# Patient Record
Sex: Female | Born: 1960
Health system: Southern US, Community
[De-identification: ages and names within clinical notes are randomized; demographics above are authoritative.]

## PROBLEM LIST (undated history)

## (undated) DIAGNOSIS — R600 Localized edema: Secondary | ICD-10-CM

## (undated) DIAGNOSIS — R000 Tachycardia, unspecified: Secondary | ICD-10-CM

## (undated) DIAGNOSIS — M1990 Unspecified osteoarthritis, unspecified site: Secondary | ICD-10-CM

## (undated) DIAGNOSIS — K219 Gastro-esophageal reflux disease without esophagitis: Secondary | ICD-10-CM

## (undated) DIAGNOSIS — F329 Major depressive disorder, single episode, unspecified: Secondary | ICD-10-CM

## (undated) DIAGNOSIS — F419 Anxiety disorder, unspecified: Secondary | ICD-10-CM

## (undated) DIAGNOSIS — F32A Depression, unspecified: Secondary | ICD-10-CM

## (undated) DIAGNOSIS — K5780 Diverticulitis of intestine, part unspecified, with perforation and abscess without bleeding: Secondary | ICD-10-CM

## (undated) DIAGNOSIS — J90 Pleural effusion, not elsewhere classified: Secondary | ICD-10-CM

## (undated) DIAGNOSIS — K5792 Diverticulitis of intestine, part unspecified, without perforation or abscess without bleeding: Secondary | ICD-10-CM

## (undated) DIAGNOSIS — R609 Edema, unspecified: Secondary | ICD-10-CM

## (undated) DIAGNOSIS — Q682 Congenital deformity of knee: Secondary | ICD-10-CM

## (undated) HISTORY — PX: RHINOPLASTY: SUR1284

## (undated) HISTORY — PX: SHOULDER ARTHROSCOPY W/ ROTATOR CUFF REPAIR: SHX2400

## (undated) HISTORY — DX: Diverticulitis of intestine, part unspecified, without perforation or abscess without bleeding: K57.92

## (undated) HISTORY — PX: EYE SURGERY: SHX253

## (undated) HISTORY — DX: Depression, unspecified: F32.A

## (undated) HISTORY — PX: LASIK: SHX215

## (undated) HISTORY — DX: Diverticulitis of intestine, part unspecified, with perforation and abscess without bleeding: K57.80

## (undated) HISTORY — PX: EXPLORATORY LAPAROTOMY: SUR591

## (undated) HISTORY — DX: Pleural effusion, not elsewhere classified: J90

## (undated) HISTORY — DX: Major depressive disorder, single episode, unspecified: F32.9

## (undated) HISTORY — PX: COLON SURGERY: SHX602

---

## 1981-09-13 HISTORY — PX: PILONIDAL CYST EXCISION: SHX744

## 1986-09-13 HISTORY — PX: HERNIA REPAIR: SHX51

## 1991-09-14 HISTORY — PX: ECTOPIC PREGNANCY SURGERY: SHX613

## 1995-09-14 HISTORY — PX: GANGLION CYST EXCISION: SHX1691

## 1998-12-04 ENCOUNTER — Other Ambulatory Visit: Admission: RE | Admit: 1998-12-04 | Discharge: 1998-12-04 | Payer: Self-pay | Admitting: Gynecology

## 2000-08-16 ENCOUNTER — Encounter: Payer: Self-pay | Admitting: Internal Medicine

## 2000-08-16 ENCOUNTER — Emergency Department (HOSPITAL_COMMUNITY): Admission: EM | Admit: 2000-08-16 | Discharge: 2000-08-16 | Payer: Self-pay | Admitting: Internal Medicine

## 2000-12-02 ENCOUNTER — Encounter: Payer: Self-pay | Admitting: Gynecology

## 2000-12-02 ENCOUNTER — Ambulatory Visit (HOSPITAL_COMMUNITY): Admission: RE | Admit: 2000-12-02 | Discharge: 2000-12-02 | Payer: Self-pay | Admitting: Gynecology

## 2001-10-09 ENCOUNTER — Encounter: Admission: RE | Admit: 2001-10-09 | Discharge: 2001-10-09 | Payer: Self-pay | Admitting: Emergency Medicine

## 2001-10-09 ENCOUNTER — Encounter: Payer: Self-pay | Admitting: Emergency Medicine

## 2002-01-10 ENCOUNTER — Encounter: Payer: Self-pay | Admitting: Gynecology

## 2002-01-10 ENCOUNTER — Ambulatory Visit (HOSPITAL_COMMUNITY): Admission: RE | Admit: 2002-01-10 | Discharge: 2002-01-10 | Payer: Self-pay | Admitting: Gynecology

## 2002-02-19 ENCOUNTER — Other Ambulatory Visit: Admission: RE | Admit: 2002-02-19 | Discharge: 2002-02-19 | Payer: Self-pay | Admitting: Gynecology

## 2002-12-06 ENCOUNTER — Ambulatory Visit (HOSPITAL_COMMUNITY): Admission: RE | Admit: 2002-12-06 | Discharge: 2002-12-06 | Payer: Self-pay | Admitting: Internal Medicine

## 2002-12-06 ENCOUNTER — Encounter: Payer: Self-pay | Admitting: Internal Medicine

## 2003-01-11 ENCOUNTER — Ambulatory Visit (HOSPITAL_COMMUNITY): Admission: RE | Admit: 2003-01-11 | Discharge: 2003-01-11 | Payer: Self-pay | Admitting: Internal Medicine

## 2003-01-11 ENCOUNTER — Encounter: Payer: Self-pay | Admitting: Internal Medicine

## 2003-04-12 ENCOUNTER — Encounter: Payer: Self-pay | Admitting: Emergency Medicine

## 2003-04-12 ENCOUNTER — Encounter: Admission: RE | Admit: 2003-04-12 | Discharge: 2003-04-12 | Payer: Self-pay | Admitting: Emergency Medicine

## 2003-06-20 ENCOUNTER — Encounter: Admission: RE | Admit: 2003-06-20 | Discharge: 2003-09-18 | Payer: Self-pay | Admitting: Emergency Medicine

## 2003-07-23 ENCOUNTER — Other Ambulatory Visit: Admission: RE | Admit: 2003-07-23 | Discharge: 2003-07-23 | Payer: Self-pay | Admitting: Obstetrics and Gynecology

## 2003-09-26 ENCOUNTER — Encounter: Admission: RE | Admit: 2003-09-26 | Discharge: 2003-12-25 | Payer: Self-pay | Admitting: Emergency Medicine

## 2004-01-14 ENCOUNTER — Ambulatory Visit (HOSPITAL_COMMUNITY): Admission: RE | Admit: 2004-01-14 | Discharge: 2004-01-14 | Payer: Self-pay | Admitting: Neurosurgery

## 2004-06-23 ENCOUNTER — Encounter: Admission: RE | Admit: 2004-06-23 | Discharge: 2004-08-19 | Payer: Self-pay | Admitting: Emergency Medicine

## 2004-08-31 ENCOUNTER — Ambulatory Visit: Payer: Self-pay | Admitting: Internal Medicine

## 2004-09-13 DIAGNOSIS — R000 Tachycardia, unspecified: Secondary | ICD-10-CM

## 2004-09-13 HISTORY — PX: OTHER SURGICAL HISTORY: SHX169

## 2004-09-13 HISTORY — PX: CARDIAC ELECTROPHYSIOLOGY STUDY AND ABLATION: SHX1294

## 2004-09-13 HISTORY — DX: Tachycardia, unspecified: R00.0

## 2004-09-21 ENCOUNTER — Ambulatory Visit: Payer: Self-pay | Admitting: Internal Medicine

## 2004-09-25 ENCOUNTER — Ambulatory Visit (HOSPITAL_COMMUNITY): Admission: RE | Admit: 2004-09-25 | Discharge: 2004-09-26 | Payer: Self-pay | Admitting: Internal Medicine

## 2004-09-25 ENCOUNTER — Ambulatory Visit: Payer: Self-pay | Admitting: Internal Medicine

## 2004-11-05 ENCOUNTER — Ambulatory Visit: Payer: Self-pay | Admitting: Internal Medicine

## 2005-01-28 ENCOUNTER — Ambulatory Visit (HOSPITAL_COMMUNITY): Admission: RE | Admit: 2005-01-28 | Discharge: 2005-01-28 | Payer: Self-pay | Admitting: Obstetrics and Gynecology

## 2005-03-24 ENCOUNTER — Encounter (INDEPENDENT_AMBULATORY_CARE_PROVIDER_SITE_OTHER): Payer: Self-pay | Admitting: *Deleted

## 2005-03-24 ENCOUNTER — Ambulatory Visit (HOSPITAL_COMMUNITY): Admission: RE | Admit: 2005-03-24 | Discharge: 2005-03-24 | Payer: Self-pay | Admitting: *Deleted

## 2005-03-24 ENCOUNTER — Ambulatory Visit (HOSPITAL_BASED_OUTPATIENT_CLINIC_OR_DEPARTMENT_OTHER): Admission: RE | Admit: 2005-03-24 | Discharge: 2005-03-24 | Payer: Self-pay | Admitting: *Deleted

## 2006-02-11 ENCOUNTER — Ambulatory Visit (HOSPITAL_COMMUNITY): Admission: RE | Admit: 2006-02-11 | Discharge: 2006-02-11 | Payer: Self-pay | Admitting: Obstetrics and Gynecology

## 2007-03-06 ENCOUNTER — Ambulatory Visit (HOSPITAL_COMMUNITY): Admission: RE | Admit: 2007-03-06 | Discharge: 2007-03-06 | Payer: Self-pay | Admitting: Obstetrics and Gynecology

## 2007-07-24 ENCOUNTER — Encounter: Admission: RE | Admit: 2007-07-24 | Discharge: 2007-10-22 | Payer: Self-pay | Admitting: Sports Medicine

## 2007-09-01 ENCOUNTER — Ambulatory Visit (HOSPITAL_COMMUNITY): Admission: RE | Admit: 2007-09-01 | Discharge: 2007-09-01 | Payer: Self-pay | Admitting: Sports Medicine

## 2007-12-08 ENCOUNTER — Ambulatory Visit: Payer: Self-pay | Admitting: Internal Medicine

## 2008-03-07 ENCOUNTER — Ambulatory Visit (HOSPITAL_COMMUNITY): Admission: RE | Admit: 2008-03-07 | Discharge: 2008-03-07 | Payer: Self-pay | Admitting: Obstetrics and Gynecology

## 2008-03-25 ENCOUNTER — Ambulatory Visit (HOSPITAL_COMMUNITY): Admission: RE | Admit: 2008-03-25 | Discharge: 2008-03-25 | Payer: Self-pay | Admitting: Orthopedic Surgery

## 2008-03-28 ENCOUNTER — Encounter: Admission: RE | Admit: 2008-03-28 | Discharge: 2008-06-26 | Payer: Self-pay | Admitting: Orthopedic Surgery

## 2008-07-03 ENCOUNTER — Encounter: Admission: RE | Admit: 2008-07-03 | Discharge: 2008-07-09 | Payer: Self-pay | Admitting: Orthopedic Surgery

## 2009-03-10 ENCOUNTER — Ambulatory Visit (HOSPITAL_COMMUNITY): Admission: RE | Admit: 2009-03-10 | Discharge: 2009-03-10 | Payer: Self-pay | Admitting: Obstetrics and Gynecology

## 2010-05-07 ENCOUNTER — Ambulatory Visit (HOSPITAL_COMMUNITY): Admission: RE | Admit: 2010-05-07 | Discharge: 2010-05-07 | Payer: Self-pay | Admitting: Obstetrics and Gynecology

## 2010-09-13 HISTORY — PX: KNEE SURGERY: SHX244

## 2010-10-04 ENCOUNTER — Encounter: Payer: Self-pay | Admitting: Sports Medicine

## 2010-12-17 ENCOUNTER — Other Ambulatory Visit: Payer: Self-pay | Admitting: Obstetrics & Gynecology

## 2011-01-26 NOTE — Op Note (Signed)
NAME:  Amanda [name], POLHEMUS              ACCOUNT NO.:  [id number]   MEDICAL RECORD NO.:  [id number]          PATIENT TYPE:  AMB   LOCATION:  SDS                          FACILITY:  [facility]   PHYSICIAN:  Almedia Balls. Ranell Patrick, M.D. DATE OF BIRTH:  [birth year]   DATE OF PROCEDURE:  DATE OF DISCHARGE:  03/25/2008                               OPERATIVE REPORT   PREOPERATIVE DIAGNOSIS:  Right shoulder superior labral tear, anterior  to posterior and rotator cuff tear.   POSTOPERATIVE DIAGNOSIS:  Right shoulder superior labral tear, anterior  to posterior and rotator cuff tear.   PROCEDURE PERFORMED:  1. Right shoulder arthroscopy with extensive intra-articular      debridement of torn superior labrum, anterior to posterior.  2. Arthroscopic biceps tenotomy followed by arthroscopic subacromial      decompression mini-open rotator cuff repair and open biceps      tenodesis in the groove.   SURGEON:  Almedia Balls. Ranell Patrick, M.D.   ASSISTANT:  Wess Botts, PA-C.   ANESTHESIA:  General anesthesia plus interscalene block anesthesia was  used.   ESTIMATED BLOOD LOSS:  Minimal.   FLUIDS REPLACEMENT:  1500 mL crystalloid.   INSTRUMENT COUNT:  Correct.   COMPLICATIONS:  None.   Perioperative antibiotics were given.   INDICATIONS:  The patient is a 50 year old right-hand dominant female  who presents with history of worsening right shoulder pain and loss of  function.  The patient has preoperative workup consisting of clinical  evaluation, plain radiographs, and MRI scans indicating a torn superior  labrum anterior to posterior as well as an injured rotator cuff, so  feeling of this is a near full thickness versus full thickness rotator  cuff tear likely requiring repair due to the patient's progressive and  persistent pain and loss of function, she is electing to proceed with  surgical management to relieve pain and restore function.  Informed  consent was obtained.   DESCRIPTION OF  PROCEDURE:  After an adequate level of anesthesia was  achieved, the patient was positioned in the modified beach chair  position.  Right shoulder was sterilely prepped and draped in usual  manner.  We examined the shoulder under anesthesia prior to the prep and  drape, noting full passive range of motion in the shoulder with no  instability.  We then entered the shoulder using standard arthroscopic  portals after sterile prep and drape using 11 blade scalpel and blunt  operators, we utilized the anteroposterior lateral portals.  We  identified a torn superior labrum anterior to posterior.  This was very  degenerative appearing tear, not amenable to repair.  We performed a  through labral debridement as well as biceps tenotomy.  The  anteroinferior labrum and posterior labrum were intact.  The articular  cartilage in the glenohumeral joint was normal.  Subscapula was normal.  Rotator cuff appeared normal both from the anterior and posterior  perspectives.  The scope was placed __________ space through a  bursectomy, acromioplasty was performed creating a type 1 acromial  shape, removed the anterior overhang and released the CA ligament.  We  did note  a what appeared to be near full thickness rotator cuff tear  located in the supraspinatus insertion area.  At this point, we  concluded the arthroscopy, made a small Saber incision in line with the  skin lines and this was a mini-open incision from the anterolateral  border of the acromion extending down about an inch and half.  Dissection carried sharply down through subcutaneous tissue, we split  the deltoid __________ fibers, identified the bicipital groove, incised  the soft tissue overlying the groove, delivered a very, very small  biceps tendon into the wound __________#2 FiberWire suture.  We then  tenodesed the biceps mid tension with the elbow at 90 degrees using a 7  x 22 mm Arthrex Bio-tenodesis screw.  The patient's femur was  extremely  small at that level.  The drill was able to get on to depth of about 23  mm.  Thus, when the tendon and screw were placed in the hole, there was  about 2-3 mm of screw up above the humerus.  This was a __________but  felt to be acceptable and the screw simply would not go any further.  At  this point, we concluded the biceps tenodesis and went ahead and  addressed the rotator cuff.  This is an  __________type bursal surface  tear, measuring about 1.5 cm in its anterior-to-posterior direction.  At  this point, we freshened up the rotator cuff footprint removing all soft  tissue and rongeuring down the greater tuberosity a little bit.  We then  placed a single 5.5 Bio-Corkscrew anchor adjacent to the intact  articular margins of the rotator cuff.  We then brought those sutures,  double loaded #2 FiberWire sutures up in a mattress fashion through the  rotator cuff, which was freed up using a Cobb elevator.  This reduced  very nicely and then we also used a mattress suture #2 FiberWire medial  to that and brought that down once we had tied her 5.5 Bio-Corkscrew  anchor and sutures and recreated the medial portion of footprint.  We  brought the mattress suture over the top and used a 4.5 PushLock anchor  out on the lateral side of the humerus to secure the lateral portion of  the footprint.  We had a nice __________repair.  We reapproximated  tendon to bone.  At this point, we took the shoulder through full range  of motion.  No impingement was noted.  We thoroughly irrigated the wound  and closed the deltoid with 0 Vicryl suture followed by 2-0 Vicryl  subcutaneous closure and 4-0 Monocryl for skin.  Sterile compressive  bandage was applied followed by a sling immobilizer.  The patient  tolerated the surgery well.      Almedia Balls. Ranell Patrick, M.D.  Electronically Signed     SRN/MEDQ  D:  03/25/2008  T:  03/26/2008  Job:  [id number]

## 2011-01-26 NOTE — Letter (Signed)
December 08, 2007    Jonita Albee, M.D.  [facility]  [address]  [place], [place] [address]   RE:  Amanda [name], Amanda [name]  MRN:  [id number]  /  DOB:  September 06, 1961   Dear Thayer Ohm;   Thank you for referring back Ms. Lupita Dawn.  As you know, she is a  very pleasant 50 year old woman, with a history of SVT; who underwent  electrophysiologic study and catheter ablation of AV node reentrant  tachycardia several years ago.  Currently, she has developed worsening  blood pressure problems and also has occasional palpitations, though no  documented arrhythmias.  She states that she has gained a little bit of  weight over the last several years, and has not been exercising.  Blood  pressures, according to the patient, run between 130 and 150 systolic.  She notes that she these have improved some since the initiation of 25  mg Toprol XL daily several weeks ago.  Her exam has been well  characterized.  Her EKG demonstrates sinus rhythm with a normal axis and  intervals.  Today her blood pressure was in the 140 range systolic.  Her  weight was 185 pounds.   With all the above, I have recommended the patient at this point to  increase her Toprol from 25 mg a day to 50 mg a day.  She had tolerated  this dose in the past.  Hopefully, this will control blood pressure.  I  have also recommended that she start back exercising; and this may also,  with some weight loss, help in improvement of blood pressure.  If the  blood pressure is not well-controlled with these medication changes, or  if she develops fatigue and weakness on 50 mg of Toprol, then obviously  other blood pressure medications could be tried.  I do not think her  palpitations are worth investigating further, at least not at this  point; and they are likely secondary to PACs and PVCs.   Thanks again for referring [name] back for evaluation.    Sincerely,      Doylene Canning. Ladona Ridgel, MD  Electronically Signed    GWT/MedQ  DD: 12/08/2007  DT: 12/09/2007  Job #: [id number]

## 2011-01-26 NOTE — Assessment & Plan Note (Signed)
[facility]                         ELECTROPHYSIOLOGY OFFICE NOTE   Amanda, [name]                     MRN:          [id number]  DATE:12/08/2007                            DOB:          1961/01/11    REFERRING PHYSICIAN:  Jonita Albee, M.D.   Amanda [name] returns today for follow-up and is referred back today for  evaluation by Dr. Robert Bellow.  She is a very pleasant 50 year old  woman, who has a history of SVT and is status post catheter ablation  back in 2006.  She also has a history of hypertension and a remote  history of ectopic pregnancies in the past.  The patient was noted to  have some increasing palpitations and elevation of her blood pressure,  and she was seen by Dr. Perrin Maltese, who had initially started her back on  Toprol 25 mg a day.  She returns today for follow-up.   She has had no syncope.  She has had no sustained heart racing.  She  denies chest pain or shortness of breath.  Additional past medical  history is notable for hernia repair in the past.  She had 2 ectopic  pregnancies.  She has a history of diverticulitis.   FAMILY HISTORY:  Notable for mother having heart disease.   SOCIAL HISTORY:  The patient has a remote history of tobacco use.  She  drinks alcohol socially, but not in excess.   REVIEW OF SYSTEMS:  This is really unremarkable, except for very mild  palpitations.  Otherwise, it is as noted in the HPI.   PHYSICAL EXAMINATION:  She is a pleasant, well-appearing woman in no  acute distress.  Blood pressure was 142/90, the pulse 70 and regular,  the respirations were 18.  The weight was 185 pounds.  HEENT:  Normocephalic and atraumatic.  Pupils were equal and round.  The  oropharynx was moist.  The sclerae are anicteric.  NECK:  Revealed no jugular distention.  There was no thyromegaly.  Trachea was midline.  The carotids are  2+ and symmetric.  LUNGS:  Clear bilaterally to auscultation.  No wheezes, rales or  rhonchi  are present.  There was no increased work of breathing.  CARDIOVASCULAR:  Revealed a regular rate and rhythm, with normal S1 and  S2.  There were no murmurs, rubs or gallops.  The PMI was not enlarged  nor was it laterally displaced.  ABDOMEN:  Soft, nontender, nondistended.  There was no organomegaly.  The bowel sounds were present. There was no rebound or guarding.  EXTREMITIES:  Demonstrate no sinus, clubbing or edema.  Pulses were  2+  and symmetric.  NEUROLOGIC:  Alert and oriented x3 with cranial nerves  intact.  Strength was 5/5 and symmetric.   EKG:  Demonstrates sinus rhythm with normal axis intervals.   IMPRESSION:  1. Worsening hypertension, although still fairly mild.  2. Rare palpitations.  3. History of sustained ventricular tachycardia, status post ablation.   DISCUSSION:  With regard to Amanda [name]'s elevated blood pressure, it is  not horribly uncontrolled.  I have asked today that we increase  her beta  blocker from 25 mg a day to 50 mg daily.  This may also help her  palpitations.  If her blood pressure is not improved, then we will plan  to see her back in the office sooner rather than later.     Amanda [name]. Amanda Ridgel, MD  Electronically Signed    GWT/MedQ  DD: 12/08/2007  DT: 12/09/2007  Job #: [id number]   cc:   Jonita Albee, M.D.

## 2011-01-29 NOTE — Discharge Summary (Signed)
NAME:  Amanda [name], Amanda [name]              ACCOUNT NO.:  [id number]   MEDICAL RECORD NO.:  [id number]          PATIENT TYPE:  OIB   LOCATION:  3742                         FACILITY:  [facility]   PHYSICIAN:  Doylene Canning. Ladona Ridgel, M.D.  DATE OF BIRTH:  03-Dec-1960   DATE OF ADMISSION:  09/25/2004  DATE OF DISCHARGE:  09/26/2004                                 DISCHARGE SUMMARY   DISCHARGE DIAGNOSES:  1.  Presyncope, progressively symptomatic, associated with supraventricular      tachycardia.  2.  Discharging day #1, status post electrophysiology studies and      radiofrequency catheter ablation of atrioventricular node, reentry      tachycardia, by way of slow P wave modification, Dr. Lewayne Bunting,      practitioner.   SECONDARY DIAGNOSES:  1.  History of migraines.  2.  History of diverticulitis in 2004.  3.  History of ectopic pregnancies.  4.  Status post abdominal herniorrhaphy.  5.  Status post rhinoplasty.   PROCEDURE:  On September 25, 2004, electrophysiology study with successful  slow P wave modification, rendering supraventricular tachycardia  noninducible.  This was an atrioventricular nodal reentry tachycardia.   DISCHARGE DISPOSITION:  Ready for discharge.   On post procedure day #1, the catheter site in the groin shows no evidence  of swelling, erythema, or bruit.  The patient has had sinus rhythm, status  post radiofrequency catheter ablation without dysrhythmias.  She has had no  respiratory compromise.  She has been afebrile.  She will go home without  Toprol XL 50 mg, her dose prior to admission.  She will go home with enteric-  coated aspirin 25 mg daily for the next six weeks.  She has been counseled  that if she plans dental work before July, 2006, even teeth-cleaning, she is  to call the office for antibiotic coverage at [phone].  As far as pain  management is concerned, Tylenol 325 mg 1-2 tabs every 4-6 hours as needed.  She is asked not to drive for the next two days, not  to engage in any  straining or heavy lifting for the next two weeks.   DISCHARGE DIET:  Low sodium, low cholesterol.   Ms. Ladona Ridgel may shower.  She is to call [phone] if she experiences pain or  swelling at the catheterization site.  She is to follow up with Dr. Ladona Ridgel  at [facility] on [address] on Thursday, February 23  at 10:15 in the morning.   BRIEF HISTORY:  Ms. Ladona Ridgel is a 50 year old female.  She has a history of  palpitations, which have been present for several years.  Over the last six  months, her symptoms have progressed in frequency and severity.  Sometimes  she has no symptoms but on some days, she has 3-4 episodes.  They typically  last several minutes and are associated with presyncope and dyspnea.  She  denies associated chest pain.  The episodes start suddenly and stop  suddenly.  There is no warning, and they are not related to exertion.  Typically after the episodes, the patient  has a severe headache.  She has  consulted with her cardiologist, Dr. Candace Cruise, and with monitoring, was  found to have a supraventricular tachycardia with heart rates exceeding 220  beats per minute.  She was placed on Toprol XL.  The patient notes that she  has continued to have symptoms, although they are not as severe.  The  Toprol, however, makes her feel somewhat fatigued.  She is referred to Dr.  Lewayne Bunting for additional evaluation.  The plan will be for treatment of  this supraventricular tachycardia by means of an electrophysiology study and  catheter ablation.  This plan has been discussed with the patient, and she  wishes to proceed.   HOSPITAL COURSE:  The patient presented to [facility] electively on  January 13.  She underwent successful  radiofrequency catheter ablation of  an inducible AV nodal reentry tachycardia.  She has had no complications in  the post procedure hospitalization.  She has been in a sinus rhythm  throughout her  hospitalization.  She goes home with the medication as  dictated and to follow up as dictated.       GM/MEDQ  D:  09/25/2004  T:  09/25/2004  Job:  [id number]   cc:   Meade Maw, M.D.  [address]  [place]  [place] [address]  Fax: [phone]   Reuben Likes, M.D.  [address]  [place]  [place] [address]  Fax: [phone]

## 2011-01-29 NOTE — Op Note (Signed)
NAME:  Amanda [name], Amanda [name]              ACCOUNT NO.:  [id number]   MEDICAL RECORD NO.:  [id number]          PATIENT TYPE:  OIB   LOCATION:  2899                         FACILITY:  [facility]   PHYSICIAN:  Doylene Canning. Ladona Ridgel, M.D.  DATE OF BIRTH:  04-05-1961   DATE OF PROCEDURE:  09/25/2004  DATE OF DISCHARGE:                                 OPERATIVE REPORT   PROCEDURES PERFORMED:  1.  Electrophysiology study.  2.  Radiofrequency catheter ablation of the AV reentrant tachycardia.   INTRODUCTION:  The patient is a very pleasant 50 year old woman with a long  history of tachypalpitations which have increased in frequency and severity  over the last several months.  She has been on multiple medications, most  recently high-dose Toprol, but continued to have symptoms.  For this reason,  she is referred for catheter ablation.  l   DESCRIPTION OF PROCEDURE:  After informed consent was obtained, the patient  was taken to the diagnostic EP laboratory in the fasting state.  After usual  preparation and draping, intravenous fentanyl and midazolam were given for  sedation.  A 6 French hexapolar catheter was inserted percutaneously into  the right jugular vein and advanced to the coronary sinus.  A 5 French  quadripolar catheter was inserted percutaneously in the right femoral vein  and advanced to the right ventricle.  A 57 French quadripolar catheter was  inserted percutaneously into the right femoral vein and advanced to the His'  bundle region.  After measurement of the basic intervals, rapid ventricular  pacing was carried out from the RV apex at a pacing cycle length of 600 msec  and stepwise decreased down to 290 msec where VA Wenckebach was preserved.  During rapid ventricular pacing, the atrial activation sequence was midline  and decremental.  Next programmed ventricular stimulation was carried out  from the RV apex at a base adjusted cycle length of 600 msec.  The S1-2  interval was stepwise  decreased down to 300 msec where a retrograde AV node  ARP was observed.  During programmed ventricular stimulation, the atrial  activation sequence was midline and decremental.  Next programmed atrial  stimulation was carried out from the coronary sinus, as well as from the  __________ atrium with a base adjusted cycle length of 500 msec.  The S1-2  interval was stepwise decreased down to 320 msec resulting in the initiation  of SVT.  The tachycardia was a narrow QRS tachycardia at a rate of 200 beats  per minute.  PVCs were then placed at the time of His' bundle refractoriness  and these did not preexcite the atria.  During tachycardia, RV pacing was  carried out demonstrating a VAV conduction sequence.  Following this the  tachycardia was terminated with coronary sinus pacing.  Next rapid atrial  pacing was carried out from the coronary sinus, as well as the _________  atrium with pacing cycle lengths of 500 msec and stepwise decreased down to  320 msec, resulting in again the initiation of SVT.  During rapid atrial  pacing with the induction of tachycardia, mapping was carried  out  demonstrating midline atrial activation and a short VA interval.  At this  point, the 7 French quadripolar catheter was inserted into the right atrium  and mapping was carried out.  Koch's triangle was somewhat smaller than  usual.  Five RF energy applications were delivered to sites 7 and 8 in  Koch's triangle.  This resulted in accelerated junctional rhythm.  Following  this, the patient was observed for approximately 40 minutes.  During this  time, rapid atrial pacing was carried out from the right atrium, the  coronary sinus and the right ventricle and there was no inducible SVT.  Slow  pathway conduction was no longer present.  The catheters were then removed,  hemostasis was assured and the patient was returned to her room in  satisfactory condition.   COMPLICATIONS:  There were no immediate procedure  complications.   RESULTS:  1.  Baseline electrocardiogram:  The baseline ECG demonstrates normal sinus      rhythm with normal axis and intervals.  2.  Baseline intervals:  The sinus node cycle length was 768 msec, the HV      interval 34 msec and the QRS duration was 83 msec.  3.  Rapid ventricular pacing:  Rapid ventricular pacing was carried out from      the RV apex at a pacing cycle length of 600 msec and stepwise decreased      down to 290 msec where AV Wenckebach was observed.  During rapid      ventricular pacing, the atrial activation was midline and decremental.  4.  Programmed ventricular stimulation:  Programmed ventricular stimulation      was carried out from the RV apex at a base adjusted cycle length of 500      msec.  The S1-S2 interval was stepwise decreased from 440 msec down to      300 msec where the retrograde AV node ERP was observed.  During      programmed ventricular stimulation the atrial activation was again      midline and decremental.  5.  Programmed atrial stimulation:  Programmed atrial stimulation was      carried out from the right atrium, as well as the coronary sinus at a      base adjusted cycle length of 600 and 500 msec.  The S1-S2 interval was      stepwise decreased down to 440 msec down to 320 msec, resulting in the      initiation of SVT.  Following catheter ablation, there was no inducible      SVT and no slow pathway conduction.  6.  Arrhythmias observed.      1.  AV node reentrant tachycardia.  Initiation rapid atrial pacing.          Duration sustained.  Termination was pacing.  Cycle length was 320          msec.  7.  Mapping:  Mapping of the tachycardia in the Koch's triangle demonstrated      the earliest activation to be in the region of the AV node.  The Koch's      triangle was the usual size and orientation.  8.  RF energy application:  A total of five RF energy applications were     delivered to sites 6 through 8 in Koch's  triangle resulting in      accelerated junctional rhythm.   CONCLUSION:  This study demonstrates successful electrophysiology study and  RF catheter ablation  of typical AV node reentrant tachycardia with a total  of five RF energy applications delivered to Koch's triangle.  There were no  immediate procedure complications.       GWT/MEDQ  D:  09/25/2004  T:  09/25/2004  Job:  [id number]   cc:   Meade Maw, M.D.  [address]  [place]  [place] [address]  Fax: [phone]   Reuben Likes, M.D.  [address]  [place]  [place] [address]  Fax: [phone]

## 2011-01-29 NOTE — Op Note (Signed)
NAME:  Amanda [name], Amanda [name]              ACCOUNT NO.:  [id number]   MEDICAL RECORD NO.:  [id number]          PATIENT TYPE:  AMB   LOCATION:  DSC                          FACILITY:  [facility]   PHYSICIAN:  Tennis Must Meyerdierks, M.D.DATE OF BIRTH:  September 19, 1960   DATE OF PROCEDURE:  03/24/2005  DATE OF DISCHARGE:                                 OPERATIVE REPORT   PREOPERATIVE DIAGNOSIS:  Onychomycosis, left small finger nail.   POSTOPERATIVE DIAGNOSIS:  Onychomycosis, left small finger nail.   PROCEDURE:  Removal of nail plate with biopsy of nail fold, left small  finger.   SURGEON:  Lowell Bouton, M.D.   ANESTHESIA:  Marcaine 0.5% local in the minor room.   OPERATIVE FINDINGS:  The patient had nonadherence of her left small finger  nail. There was tissue underneath the nail that appeared to be either fungal  or yeast type. The eponychium fold had a blackened area that was biopsied.   DESCRIPTION OF PROCEDURE:  Under 0.5% Marcaine local anesthesia in the minor  room, the left hand was prepped and draped in the usual fashion and a  Penrose drain tourniquet was placed around the base of the small finger. The  nail plate was removed with a Therapist, nutritional and was sent to the laboratory  for evaluation and culture. The nail bed was then explored and there were no  lesions present in the bed, but there was an area of the ulnar nail fold  that had a black material in it. This was biopsied and sent to pathology.  The wound was then dressed with a sterile dressing. The patient had the  tourniquet released and was discharged in good condition.       EMM/MEDQ  D:  03/24/2005  T:  03/24/2005  Job:  [id number]

## 2011-02-16 ENCOUNTER — Ambulatory Visit: Payer: 59 | Attending: Specialist | Admitting: Rehabilitative and Restorative Service Providers"

## 2011-02-16 DIAGNOSIS — M25569 Pain in unspecified knee: Secondary | ICD-10-CM | POA: Insufficient documentation

## 2011-02-16 DIAGNOSIS — IMO0001 Reserved for inherently not codable concepts without codable children: Secondary | ICD-10-CM | POA: Insufficient documentation

## 2011-02-16 DIAGNOSIS — M25659 Stiffness of unspecified hip, not elsewhere classified: Secondary | ICD-10-CM | POA: Insufficient documentation

## 2011-02-22 ENCOUNTER — Ambulatory Visit: Payer: 59 | Admitting: Physical Therapy

## 2011-02-24 ENCOUNTER — Ambulatory Visit: Payer: 59 | Admitting: Physical Therapy

## 2011-03-01 ENCOUNTER — Ambulatory Visit: Payer: 59 | Admitting: Rehabilitative and Restorative Service Providers"

## 2011-03-03 ENCOUNTER — Encounter: Payer: 59 | Admitting: Rehabilitative and Restorative Service Providers"

## 2011-03-04 ENCOUNTER — Ambulatory Visit: Payer: 59 | Admitting: Rehabilitative and Restorative Service Providers"

## 2011-03-09 ENCOUNTER — Other Ambulatory Visit (HOSPITAL_COMMUNITY): Payer: Self-pay | Admitting: Orthopedic Surgery

## 2011-03-09 DIAGNOSIS — M25561 Pain in right knee: Secondary | ICD-10-CM

## 2011-03-11 ENCOUNTER — Ambulatory Visit (HOSPITAL_COMMUNITY)
Admission: RE | Admit: 2011-03-11 | Discharge: 2011-03-11 | Disposition: A | Discharge status: Home / Self Care | Payer: 59 | Source: Ambulatory Visit | Attending: Orthopedic Surgery | Admitting: Orthopedic Surgery

## 2011-03-11 DIAGNOSIS — M25561 Pain in right knee: Secondary | ICD-10-CM

## 2011-03-11 DIAGNOSIS — M2240 Chondromalacia patellae, unspecified knee: Secondary | ICD-10-CM | POA: Insufficient documentation

## 2011-03-11 DIAGNOSIS — M25569 Pain in unspecified knee: Secondary | ICD-10-CM | POA: Insufficient documentation

## 2011-03-12 ENCOUNTER — Ambulatory Visit: Payer: 59 | Admitting: Physical Therapy

## 2011-03-16 ENCOUNTER — Ambulatory Visit: Payer: 59 | Attending: Specialist | Admitting: Physical Therapy

## 2011-03-16 DIAGNOSIS — M25569 Pain in unspecified knee: Secondary | ICD-10-CM | POA: Insufficient documentation

## 2011-03-16 DIAGNOSIS — M25659 Stiffness of unspecified hip, not elsewhere classified: Secondary | ICD-10-CM | POA: Insufficient documentation

## 2011-03-16 DIAGNOSIS — IMO0001 Reserved for inherently not codable concepts without codable children: Secondary | ICD-10-CM | POA: Insufficient documentation

## 2011-03-18 ENCOUNTER — Ambulatory Visit: Payer: 59 | Admitting: Physical Therapy

## 2011-04-05 ENCOUNTER — Telehealth: Payer: Self-pay | Admitting: Internal Medicine

## 2011-04-05 NOTE — Telephone Encounter (Signed)
Pt. States had an episode of mid back pressure and having trouble breathing, almost passed out. The pain subsided, then came back this time under ribs cage in both sides in back. Patient denies any symptoms at this time. Patient was made aware that she was seen onetime by Dr. Ladona Ridgel in March 2009 for palpitations,could make an appointment with Dr. Ladona Ridgel as a new patient. Also she could see her PCP to asses her, because it could be something else. Pt will call her PCP for an appointment. Pt said will call back to make an appointment with Dr. Ladona Ridgel.

## 2011-04-05 NOTE — Telephone Encounter (Addendum)
Pt had episode on Friday, back pressure, trouble breathing, almost passed out, subsided, then came back under ribs then up into back, no symptoms since then, not seen in 3-4 yrs-pls advise pt [phone]

## 2011-04-29 ENCOUNTER — Other Ambulatory Visit (HOSPITAL_COMMUNITY): Payer: Self-pay | Admitting: Obstetrics & Gynecology

## 2011-04-29 DIAGNOSIS — Z1231 Encounter for screening mammogram for malignant neoplasm of breast: Secondary | ICD-10-CM

## 2011-05-11 ENCOUNTER — Ambulatory Visit (HOSPITAL_COMMUNITY)
Admission: RE | Admit: 2011-05-11 | Discharge: 2011-05-11 | Disposition: A | Discharge status: Home / Self Care | Payer: 59 | Source: Ambulatory Visit | Attending: Obstetrics & Gynecology | Admitting: Obstetrics & Gynecology

## 2011-05-11 DIAGNOSIS — Z1231 Encounter for screening mammogram for malignant neoplasm of breast: Secondary | ICD-10-CM | POA: Insufficient documentation

## 2011-06-10 LAB — URINALYSIS, ROUTINE W REFLEX MICROSCOPIC
Bilirubin Urine: NEGATIVE
Glucose, UA: NEGATIVE
Hgb urine dipstick: NEGATIVE
Protein, ur: NEGATIVE
Specific Gravity, Urine: 1.014
pH: 7.5

## 2011-06-10 LAB — DIFFERENTIAL
Lymphocytes Relative: 28
Monocytes Relative: 8
Neutrophils Relative %: 62

## 2011-06-10 LAB — CBC
Hemoglobin: 13
MCHC: 35.2
RBC: 3.95
RDW: 14

## 2011-06-10 LAB — BASIC METABOLIC PANEL
Potassium: 4.2
Sodium: 140

## 2011-06-10 LAB — PROTIME-INR: Prothrombin Time: 13.1

## 2011-07-26 ENCOUNTER — Encounter (HOSPITAL_BASED_OUTPATIENT_CLINIC_OR_DEPARTMENT_OTHER): Payer: Self-pay | Admitting: *Deleted

## 2011-07-26 NOTE — Progress Notes (Signed)
NPO after MN with exception water/gatorade until 0730. Pt to arrive at 1130. Needs hg and urine preg. May take hydrocodone if needed w/ sips of water.

## 2011-07-27 ENCOUNTER — Ambulatory Visit (HOSPITAL_BASED_OUTPATIENT_CLINIC_OR_DEPARTMENT_OTHER)
Admission: RE | Admit: 2011-07-27 | Discharge: 2011-07-27 | Disposition: A | Discharge status: Home / Self Care | Payer: 59 | Source: Ambulatory Visit | Attending: Specialist | Admitting: Specialist

## 2011-07-27 ENCOUNTER — Other Ambulatory Visit: Payer: Self-pay

## 2011-07-27 ENCOUNTER — Encounter (HOSPITAL_BASED_OUTPATIENT_CLINIC_OR_DEPARTMENT_OTHER)
Admission: RE | Disposition: A | Discharge status: Home / Self Care | Payer: Self-pay | Source: Ambulatory Visit | Attending: Specialist

## 2011-07-27 ENCOUNTER — Encounter (HOSPITAL_BASED_OUTPATIENT_CLINIC_OR_DEPARTMENT_OTHER): Payer: Self-pay | Admitting: Anesthesiology

## 2011-07-27 ENCOUNTER — Ambulatory Visit (HOSPITAL_BASED_OUTPATIENT_CLINIC_OR_DEPARTMENT_OTHER): Payer: 59 | Admitting: Anesthesiology

## 2011-07-27 DIAGNOSIS — M2390 Unspecified internal derangement of unspecified knee: Secondary | ICD-10-CM | POA: Insufficient documentation

## 2011-07-27 DIAGNOSIS — M1710 Unilateral primary osteoarthritis, unspecified knee: Secondary | ICD-10-CM | POA: Insufficient documentation

## 2011-07-27 DIAGNOSIS — M25569 Pain in unspecified knee: Secondary | ICD-10-CM | POA: Insufficient documentation

## 2011-07-27 DIAGNOSIS — Z79899 Other long term (current) drug therapy: Secondary | ICD-10-CM | POA: Insufficient documentation

## 2011-07-27 HISTORY — DX: Congenital deformity of knee: Q68.2

## 2011-07-27 HISTORY — DX: Tachycardia, unspecified: R00.0

## 2011-07-27 LAB — POCT HEMOGLOBIN-HEMACUE: Hemoglobin: 12.1 g/dL (ref 12.0–15.0)

## 2011-07-27 SURGERY — ARTHROSCOPY, KNEE, WITH LATERAL RETINACULUM RELEASE
Anesthesia: Monitor Anesthesia Care | Site: Knee | Laterality: Right

## 2011-07-27 MED ORDER — MIDAZOLAM HCL 2 MG/2ML IJ SOLN
2.0000 mg | Freq: Once | INTRAMUSCULAR | Status: AC
  Administered 2011-07-27: 2 mg via INTRAVENOUS

## 2011-07-27 MED ORDER — CEFAZOLIN SODIUM-DEXTROSE 2-3 GM-% IV SOLR
2.0000 g | Freq: Once | INTRAVENOUS | Status: AC
  Administered 2011-07-27: 2 g via INTRAVENOUS

## 2011-07-27 MED ORDER — DEXAMETHASONE SODIUM PHOSPHATE 4 MG/ML IJ SOLN
INTRAMUSCULAR | Status: DC | PRN
  Administered 2011-07-27: 4 mg via INTRAVENOUS

## 2011-07-27 MED ORDER — FENTANYL CITRATE 0.05 MG/ML IJ SOLN: 25.0000 ug | INTRAMUSCULAR | Status: DC | PRN

## 2011-07-27 MED ORDER — METHOCARBAMOL 500 MG PO TABS: 500.0000 mg | ORAL_TABLET | Freq: Four times a day (QID) | ORAL | Status: AC | PRN

## 2011-07-27 MED ORDER — CEPHALEXIN 500 MG PO CAPS: 500.0000 mg | ORAL_CAPSULE | Freq: Three times a day (TID) | ORAL | Status: AC

## 2011-07-27 MED ORDER — FENTANYL CITRATE 0.05 MG/ML IJ SOLN
INTRAMUSCULAR | Status: DC | PRN
  Administered 2011-07-27 (×2): 50 ug via INTRAVENOUS
  Administered 2011-07-27: 100 ug via INTRAVENOUS

## 2011-07-27 MED ORDER — LIDOCAINE HCL (CARDIAC) 20 MG/ML IV SOLN
INTRAVENOUS | Status: DC | PRN
  Administered 2011-07-27: 100 mg via INTRAVENOUS

## 2011-07-27 MED ORDER — BUPIVACAINE HCL (PF) 0.25 % IJ SOLN
INTRAMUSCULAR | Status: DC | PRN
  Administered 2011-07-27: 20 mL

## 2011-07-27 MED ORDER — STERILE WATER FOR IRRIGATION IR SOLN
Status: DC | PRN
  Administered 2011-07-27: 500 mL

## 2011-07-27 MED ORDER — MORPHINE SULFATE 4 MG/ML IJ SOLN
INTRAMUSCULAR | Status: DC | PRN
  Administered 2011-07-27: 4 mg via INTRAVENOUS

## 2011-07-27 MED ORDER — LIDOCAINE-EPINEPHRINE (PF) 1 %-1:200000 IJ SOLN
INTRAMUSCULAR | Status: DC | PRN
  Administered 2011-07-27: 15 mL

## 2011-07-27 MED ORDER — SODIUM CHLORIDE 0.9 % IR SOLN
Status: DC | PRN
  Administered 2011-07-27: 6000 mL

## 2011-07-27 MED ORDER — FENTANYL CITRATE 0.05 MG/ML IJ SOLN
25.0000 ug | INTRAMUSCULAR | Status: DC | PRN
  Administered 2011-07-27 (×3): 25 ug via INTRAVENOUS

## 2011-07-27 MED ORDER — SODIUM CHLORIDE 0.9 % IV SOLN
Freq: Once | INTRAVENOUS | Status: DC
  Filled 2011-07-27: qty 15

## 2011-07-27 MED ORDER — PROMETHAZINE HCL 25 MG/ML IJ SOLN: 6.2500 mg | INTRAMUSCULAR | Status: DC | PRN

## 2011-07-27 MED ORDER — MIDAZOLAM HCL 5 MG/5ML IJ SOLN
INTRAMUSCULAR | Status: DC | PRN
  Administered 2011-07-27 (×2): 1 mg via INTRAVENOUS
  Administered 2011-07-27: 2 mg via INTRAVENOUS

## 2011-07-27 MED ORDER — LACTATED RINGERS IV SOLN
INTRAVENOUS | Status: DC
  Administered 2011-07-27 (×2): via INTRAVENOUS

## 2011-07-27 MED ORDER — ONDANSETRON HCL 4 MG/2ML IJ SOLN
INTRAMUSCULAR | Status: DC | PRN
  Administered 2011-07-27: 4 mg via INTRAVENOUS

## 2011-07-27 MED ORDER — PROPOFOL 10 MG/ML IV EMUL
INTRAVENOUS | Status: DC | PRN
  Administered 2011-07-27 (×2): 100 mg via INTRAVENOUS
  Administered 2011-07-27 (×2): 50 mg via INTRAVENOUS
  Administered 2011-07-27: 100 mg via INTRAVENOUS
  Administered 2011-07-27: 50 mg via INTRAVENOUS

## 2011-07-27 MED ORDER — OXYCODONE-ACETAMINOPHEN 5-325 MG PO TABS: 1.0000 | ORAL_TABLET | ORAL | Status: AC | PRN

## 2011-07-27 MED ORDER — OXYCODONE-ACETAMINOPHEN 5-325 MG PO TABS
1.0000 | ORAL_TABLET | ORAL | Status: DC | PRN
  Administered 2011-07-27: 1 via ORAL

## 2011-07-27 MED ORDER — FENTANYL CITRATE 0.05 MG/ML IJ SOLN
100.0000 ug | Freq: Once | INTRAMUSCULAR | Status: AC
  Administered 2011-07-27: 100 ug via INTRAVENOUS

## 2011-07-27 MED ORDER — METHOCARBAMOL 500 MG PO TABS
500.0000 mg | ORAL_TABLET | Freq: Three times a day (TID) | ORAL | Status: DC
  Administered 2011-07-27: 500 mg via ORAL

## 2011-07-27 SURGICAL SUPPLY — 42 items
BANDAGE ESMARK 6X9 LF (GAUZE/BANDAGES/DRESSINGS) IMPLANT
BANDAGE GAUZE ELAST BULKY 4 IN (GAUZE/BANDAGES/DRESSINGS) ×2 IMPLANT
BLADE 4.2CUDA (BLADE) IMPLANT
BLADE CUDA GRT WHITE 3.5 (BLADE) ×2 IMPLANT
BLADE CUDA SHAVER 3.5 (BLADE) ×2 IMPLANT
BNDG ESMARK 6X9 LF (GAUZE/BANDAGES/DRESSINGS)
CANISTER SUCT LVC 12 LTR MEDI- (MISCELLANEOUS) ×4 IMPLANT
CANISTER SUCTION 1200CC (MISCELLANEOUS) ×2 IMPLANT
CLOTH BEACON ORANGE TIMEOUT ST (SAFETY) ×2 IMPLANT
DRAPE ARTHROSCOPY W/POUCH 114 (DRAPES) ×2 IMPLANT
DRAPE INCISE 23X17 IOBAN STRL (DRAPES) ×1
DRAPE INCISE IOBAN 23X17 STRL (DRAPES) ×1 IMPLANT
DRSG PAD ABDOMINAL 8X10 ST (GAUZE/BANDAGES/DRESSINGS) ×4 IMPLANT
DURAPREP 26ML APPLICATOR (WOUND CARE) ×2 IMPLANT
ELECT MENISCUS 165MM 90D (ELECTRODE) IMPLANT
ELECT REM PT RETURN 9FT ADLT (ELECTROSURGICAL)
ELECTRODE REM PT RTRN 9FT ADLT (ELECTROSURGICAL) IMPLANT
GAUZE XEROFORM 1X8 LF (GAUZE/BANDAGES/DRESSINGS) ×2 IMPLANT
GLOVE INDICATOR 6.5 STRL GRN (GLOVE) ×4 IMPLANT
GLOVE INDICATOR 8.0 STRL GRN (GLOVE) ×4 IMPLANT
GLOVE SURG ORTHO 8.0 STRL STRW (GLOVE) ×4 IMPLANT
GOWN PREVENTION PLUS LG XLONG (DISPOSABLE) ×2 IMPLANT
GOWN STRL REIN XL XLG (GOWN DISPOSABLE) ×6 IMPLANT
IMMOBILIZER KNEE 22 UNIV (SOFTGOODS) ×2 IMPLANT
IMMOBILIZER KNEE 24 THIGH 36 (MISCELLANEOUS) IMPLANT
IMMOBILIZER KNEE 24 UNIV (MISCELLANEOUS)
KNEE WRAP E Z 3 GEL PACK (MISCELLANEOUS) ×2 IMPLANT
MINI VAC (SURGICAL WAND) ×4 IMPLANT
PACK ARTHROSCOPY DSU (CUSTOM PROCEDURE TRAY) ×2 IMPLANT
PACK BASIN DAY SURGERY FS (CUSTOM PROCEDURE TRAY) ×2 IMPLANT
PADDING CAST ABS 4INX4YD NS (CAST SUPPLIES) ×1
PADDING CAST ABS COTTON 4X4 ST (CAST SUPPLIES) ×1 IMPLANT
PENCIL BUTTON HOLSTER BLD 10FT (ELECTRODE) IMPLANT
SET ARTHROSCOPY TUBING (MISCELLANEOUS) ×1
SET ARTHROSCOPY TUBING LN (MISCELLANEOUS) ×1 IMPLANT
SPONGE GAUZE 4X4 12PLY (GAUZE/BANDAGES/DRESSINGS) ×2 IMPLANT
SUT ETHILON 3 0 FSL (SUTURE) ×2 IMPLANT
SYR CONTROL 10ML LL (SYRINGE) ×2 IMPLANT
TOWEL OR 17X24 6PK STRL BLUE (TOWEL DISPOSABLE) ×2 IMPLANT
WAND 30 DEG SABER W/CORD (SURGICAL WAND) ×2 IMPLANT
WAND 90 DEG TURBOVAC W/CORD (SURGICAL WAND) IMPLANT
WATER STERILE IRR 500ML POUR (IV SOLUTION) ×2 IMPLANT

## 2011-07-27 NOTE — Anesthesia Preprocedure Evaluation (Signed)
Anesthesia Evaluation  Patient identified by MRN, date of birth, ID band Patient awake    Reviewed: Allergy & Precautions, H&P , NPO status , Patient's Chart, lab work & pertinent test results, reviewed documented beta blocker date and time   History of Anesthesia Complications Negative for: history of anesthetic complications  Airway Mallampati: II TM Distance: <3 FB   Mouth opening: Limited Mouth Opening  Dental No notable dental hx.    Pulmonary neg pulmonary ROS,  clear to auscultation  Pulmonary exam normal       Cardiovascular - CAD - dysrhythmias (Prior ablation procedure for hx of SVT, stable) Supra Ventricular Tachycardia regular Normal    Neuro/Psych Negative Neurological ROS  Negative Psych ROS   GI/Hepatic negative GI ROS, Neg liver ROS,   Endo/Other  Negative Endocrine ROS  Renal/GU negative Renal ROS  Genitourinary negative   Musculoskeletal   Abdominal   Peds  Hematology negative hematology ROS (+)   Anesthesia Other Findings   Reproductive/Obstetrics negative OB ROS                           Anesthesia Physical Anesthesia Plan  ASA: II  Anesthesia Plan: MAC   Post-op Pain Management:    Induction:   Airway Management Planned:   Additional Equipment:   Intra-op Plan:   Post-operative Plan:   Informed Consent: I have reviewed the patients History and Physical, chart, labs and discussed the procedure including the risks, benefits and alternatives for the proposed anesthesia with the patient or authorized representative who has indicated his/her understanding and acceptance.   Dental Advisory Given  Plan Discussed with: CRNA  Anesthesia Plan Comments:         Anesthesia Quick Evaluation

## 2011-07-27 NOTE — Anesthesia Postprocedure Evaluation (Signed)
  Anesthesia Post-op Note  Patient: Amanda [name]  Procedure(s) Performed:  KNEE ARTHROSCOPY WITH LATERAL RELEASE - RIGHT KNEE ARTHROSCOPY WITH DEBRIDEMENT AND LATERAL RELEASE chrondoplasty LOCAL ANESETHESIA WITH MAC  Patient Location: PACU  Anesthesia Type: MAC  Level of Consciousness: awake and alert   Airway and Oxygen Therapy: Patient Spontanous Breathing  Post-op Pain: mild  Post-op Assessment: Post-op Vital signs reviewed, Patient's Cardiovascular Status Stable, Respiratory Function Stable, Patent Airway and No signs of Nausea or vomiting  Post-op Vital Signs: stable  Complications: No apparent anesthesia complications 

## 2011-07-27 NOTE — Op Note (Signed)
Preop diagnosis right knee patellofemoral maltracking with osteoarthritis Postop diagnosis same Procedure right knee arthroscopy with chondroplasty and lateral release Surgeon Valma Cava M.D. Asst. Kingsley Spittle Anesthesia knee block with monitored anesthesia care Estimated blood loss minimal Drains none Tourniquet time none Complications none Disposition PACU stable  Operative details Patient was counseled in the holding area. Chart reviewed and signed appropriately. The block administered per Dr. Rica Mast. Patient was taken the operating room and received 2 g of Ancef intravenously preoperatively. Placed into a palmar prepped with DuraPrep and draped in a sterile fashion. Monitored anesthesia care delivered. Time out was done in standard fashion.  Portals were established proximal medial anteromedial anterolateral after anesthetizing the skin with lidocaine and epinephrine. Diagnostic arthroscopy revealed grade 3 and 4 chondromalacia of the lateral patella facet and grade 3 of the femoral trochlea. Quite a bit of synovitis in the patellar patellar region lateral patella tilting. Anterior cruciate ligament and PCL intact. Medial compartment inspected mild softening of articular cartilage medial meniscus intact. Lateral compartment suspected lateral meniscus intact a mild softening of lateral articular cartilage.  Beginning in the patellofemoral joint a synovectomy was performed. A lateral release was performed beginning 2 fingerbreadths above the patella and extending distally to the inferolateral portal going sequentially through the synovium capsule and retinaculum. Telephone drawer was markedly decompressed and patellofemoral tracking was normal at this time. Hemostasis was obtained and knee was copiously irrigated. 4 nylon was placed in the portals. An 10 cc of 0.25% Sensorcaine was placed into the joint with 4 mg of morphine sulfate. Sterile dressing was applied TED hose and ice pack. In  addition, a TED hose had been applied to the uninvolved leg in the holding area. Patient was taken from the operating room to the PACU in stable condition.

## 2011-07-27 NOTE — H&P (Signed)
Is a brief admission H&P for [employer]  Chief complaint painful right knee Patient is a 50 year old female with a painful right knee evaluation workup reveals that she has maltracking of her patella of her right knee the patient has elected to proceed with a knee arthroscopy with lateral release. Allergies no known drug allergies Medications metoprolol and Norco Past medical history includes cardiac tachycardia which for which she has had a cardiac oblation 2006 Past surgical history includes a right shoulder rotator cuff repair abdomen all abdominal hernia, a few of infertility procedures Exam the patient is conscious alert appropriate healthy appearing female in no distress Head is atraumatic normocephalic oropharynx intact visual is visualization intact Neck is supple no palpable lymphadenopathy good range of motion Lungs sounds were clear throughout Heart regular rate and rhythm Abdomen soft bowel sounds present She had good motion of both lower extremities with good pulses and sensation in the feet   Impression right knee pain with maltracking patella History of cardiac tachycardia status post a cardiac ablation  Plan the patient will undergo a knee arthroscopy at [facility] outpatient surgical center have an arthroscopic lateral release and evaluation of her right knee patient has discussed with anesthesia the pros and cons and possible complications of anesthesia with this procedure  End of dictation

## 2011-07-27 NOTE — Anesthesia Postprocedure Evaluation (Signed)
  Anesthesia Post-op Note  Patient: Amanda [name]  Procedure(s) Performed:  KNEE ARTHROSCOPY WITH LATERAL RELEASE - RIGHT KNEE ARTHROSCOPY WITH DEBRIDEMENT AND LATERAL RELEASE chrondoplasty LOCAL ANESETHESIA WITH MAC  Patient Location: PACU  Anesthesia Type: MAC  Level of Consciousness: awake and alert   Airway and Oxygen Therapy: Patient Spontanous Breathing  Post-op Pain: mild  Post-op Assessment: Post-op Vital signs reviewed, Patient's Cardiovascular Status Stable, Respiratory Function Stable, Patent Airway and No signs of Nausea or vomiting  Post-op Vital Signs: stable  Complications: No apparent anesthesia complications

## 2011-07-27 NOTE — Progress Notes (Signed)
Lyman Speller, Pa paged via beeper

## 2011-07-27 NOTE — Progress Notes (Signed)
Dr. Thomasena Edis paged via office.  Pt ?spouse wants clarification on immobilzer use, wt bear, question re pt appt

## 2011-07-27 NOTE — Transfer of Care (Signed)
Immediate Anesthesia Transfer of Care Note  Patient: Amanda [name]  Procedure(s) Performed:  KNEE ARTHROSCOPY WITH LATERAL RELEASE - RIGHT KNEE ARTHROSCOPY WITH DEBRIDEMENT AND LATERAL RELEASE chrondoplasty LOCAL ANESETHESIA WITH MAC  Patient Location: PACU  Anesthesia Type:MACl  Level of Consciousness: awake, alert  and oriented  Airway & Oxygen Therapy: Patient Spontanous Breathing and Patient connected to face mask oxygen  Post-op Assessment: Report given to PACU RN and Post -op Vital signs reviewed and stable  Post vital signs: Reviewed and stable  Complications: No apparent anesthesia complications

## 2011-07-27 NOTE — Progress Notes (Signed)
Pt decided to go home. They will try to reach dr. Thomasena Edis from home

## 2011-07-30 ENCOUNTER — Ambulatory Visit: Payer: 59 | Attending: Specialist | Admitting: Physical Therapy

## 2011-07-30 DIAGNOSIS — M25669 Stiffness of unspecified knee, not elsewhere classified: Secondary | ICD-10-CM | POA: Insufficient documentation

## 2011-07-30 DIAGNOSIS — M6281 Muscle weakness (generalized): Secondary | ICD-10-CM | POA: Insufficient documentation

## 2011-07-30 DIAGNOSIS — R5381 Other malaise: Secondary | ICD-10-CM | POA: Insufficient documentation

## 2011-07-30 DIAGNOSIS — IMO0001 Reserved for inherently not codable concepts without codable children: Secondary | ICD-10-CM | POA: Insufficient documentation

## 2011-07-30 DIAGNOSIS — M25569 Pain in unspecified knee: Secondary | ICD-10-CM | POA: Insufficient documentation

## 2011-08-04 ENCOUNTER — Ambulatory Visit: Payer: 59 | Admitting: Physical Therapy

## 2011-08-09 ENCOUNTER — Ambulatory Visit: Payer: 59 | Admitting: Physical Therapy

## 2011-08-11 ENCOUNTER — Ambulatory Visit: Payer: 59 | Admitting: Physical Therapy

## 2011-08-13 ENCOUNTER — Ambulatory Visit: Payer: 59 | Admitting: Physical Therapy

## 2011-08-16 ENCOUNTER — Ambulatory Visit: Payer: 59 | Attending: Specialist | Admitting: Physical Therapy

## 2011-08-16 DIAGNOSIS — M25569 Pain in unspecified knee: Secondary | ICD-10-CM | POA: Insufficient documentation

## 2011-08-16 DIAGNOSIS — M6281 Muscle weakness (generalized): Secondary | ICD-10-CM | POA: Insufficient documentation

## 2011-08-16 DIAGNOSIS — M25669 Stiffness of unspecified knee, not elsewhere classified: Secondary | ICD-10-CM | POA: Insufficient documentation

## 2011-08-16 DIAGNOSIS — R5381 Other malaise: Secondary | ICD-10-CM | POA: Insufficient documentation

## 2011-08-16 DIAGNOSIS — IMO0001 Reserved for inherently not codable concepts without codable children: Secondary | ICD-10-CM | POA: Insufficient documentation

## 2011-08-18 ENCOUNTER — Ambulatory Visit: Payer: 59 | Admitting: Physical Therapy

## 2011-08-20 ENCOUNTER — Ambulatory Visit: Payer: 59 | Admitting: Physical Therapy

## 2011-08-23 ENCOUNTER — Ambulatory Visit: Payer: 59 | Admitting: Physical Therapy

## 2011-08-27 ENCOUNTER — Encounter: Payer: 59 | Admitting: Physical Therapy

## 2011-08-30 ENCOUNTER — Ambulatory Visit: Payer: 59 | Admitting: Physical Therapy

## 2011-09-01 ENCOUNTER — Ambulatory Visit: Payer: 59 | Admitting: Physical Therapy

## 2011-09-03 ENCOUNTER — Ambulatory Visit: Payer: 59 | Admitting: Physical Therapy

## 2011-09-08 ENCOUNTER — Ambulatory Visit: Payer: 59 | Admitting: Physical Therapy

## 2011-09-13 ENCOUNTER — Ambulatory Visit: Payer: 59 | Admitting: Physical Therapy

## 2011-09-16 ENCOUNTER — Ambulatory Visit: Payer: 59 | Attending: Specialist | Admitting: Physical Therapy

## 2011-09-16 DIAGNOSIS — IMO0001 Reserved for inherently not codable concepts without codable children: Secondary | ICD-10-CM | POA: Insufficient documentation

## 2011-09-16 DIAGNOSIS — M25669 Stiffness of unspecified knee, not elsewhere classified: Secondary | ICD-10-CM | POA: Insufficient documentation

## 2011-09-16 DIAGNOSIS — R5381 Other malaise: Secondary | ICD-10-CM | POA: Insufficient documentation

## 2011-09-16 DIAGNOSIS — M25569 Pain in unspecified knee: Secondary | ICD-10-CM | POA: Insufficient documentation

## 2011-09-16 DIAGNOSIS — M6281 Muscle weakness (generalized): Secondary | ICD-10-CM | POA: Insufficient documentation

## 2011-09-20 ENCOUNTER — Encounter: Payer: 59 | Admitting: Physical Therapy

## 2011-11-27 ENCOUNTER — Ambulatory Visit (INDEPENDENT_AMBULATORY_CARE_PROVIDER_SITE_OTHER): Payer: 59 | Admitting: Family Medicine

## 2011-11-27 ENCOUNTER — Ambulatory Visit (HOSPITAL_COMMUNITY)
Admission: RE | Admit: 2011-11-27 | Discharge: 2011-11-27 | Disposition: A | Discharge status: Home / Self Care | Payer: 59 | Source: Ambulatory Visit | Attending: Family Medicine | Admitting: Family Medicine

## 2011-11-27 VITALS — BP 114/78 | HR 111 | Temp 99.4°F | Resp 18 | Ht 67.5 in | Wt 191.0 lb

## 2011-11-27 DIAGNOSIS — D72829 Elevated white blood cell count, unspecified: Secondary | ICD-10-CM

## 2011-11-27 DIAGNOSIS — R599 Enlarged lymph nodes, unspecified: Secondary | ICD-10-CM | POA: Insufficient documentation

## 2011-11-27 DIAGNOSIS — R1032 Left lower quadrant pain: Secondary | ICD-10-CM

## 2011-11-27 DIAGNOSIS — K5730 Diverticulosis of large intestine without perforation or abscess without bleeding: Secondary | ICD-10-CM | POA: Insufficient documentation

## 2011-11-27 DIAGNOSIS — K529 Noninfective gastroenteritis and colitis, unspecified: Secondary | ICD-10-CM

## 2011-11-27 DIAGNOSIS — D7289 Other specified disorders of white blood cells: Secondary | ICD-10-CM

## 2011-11-27 DIAGNOSIS — J90 Pleural effusion, not elsewhere classified: Secondary | ICD-10-CM | POA: Insufficient documentation

## 2011-11-27 LAB — POCT CBC
Granulocyte percent: 77.7 %G (ref 37–80)
HCT, POC: 35 % — AB (ref 37.7–47.9)
Lymph, poc: 2.9 (ref 0.6–3.4)
MCH, POC: 27.5 pg (ref 27–31.2)
MCHC: 31.7 g/dL — AB (ref 31.8–35.4)
MCV: 86.8 fL (ref 80–97)
MID (cbc): 1.6 — AB (ref 0–0.9)
POC LYMPH PERCENT: 14.5 %L (ref 10–50)
RDW, POC: 15.1 %
WBC: 20.1 10*3/uL — AB (ref 4.6–10.2)

## 2011-11-27 LAB — POCT URINALYSIS DIPSTICK
Ketones, UA: 40
Protein, UA: 30
Spec Grav, UA: 1.02

## 2011-11-27 LAB — POCT UA - MICROSCOPIC ONLY
Crystals, Ur, HPF, POC: NEGATIVE
Mucus, UA: POSITIVE
Yeast, UA: NEGATIVE

## 2011-11-27 MED ORDER — IOHEXOL 300 MG/ML  SOLN
100.0000 mL | Freq: Once | INTRAMUSCULAR | Status: AC | PRN
  Administered 2011-11-27: 100 mL via INTRAVENOUS

## 2011-11-27 MED ORDER — CEFTRIAXONE SODIUM 1 G IJ SOLR: 1.0000 g | Freq: Once | INTRAMUSCULAR | Status: DC

## 2011-11-27 MED ORDER — CEFTRIAXONE SODIUM 1 G IJ SOLR
1.0000 g | Freq: Once | INTRAMUSCULAR | Status: AC
  Administered 2011-11-27: 1 g via INTRAMUSCULAR

## 2011-11-27 NOTE — Progress Notes (Signed)
  Subjective:    Patient ID: Amanda [name], female    DOB: [birth year], 51 y.o.   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  HENT:  Mouth/Throat: Oropharynx is clear and moist.  Neck: Neck supple.  Cardiovascular: Normal rate, regular rhythm and normal heart sounds.   Pulmonary/Chest: Effort normal and breath sounds normal.  Abdominal: Soft. She exhibits distension. There is no hepatosplenomegaly. There is tenderness. There is guarding. There is no rebound.  Neurological: She is alert.  Skin: Skin is warm.     Results for orders placed in visit on 11/27/11  POCT CBC      Component Value Range   WBC 20.1 (*) 4.6 - 10.2 (K/uL)   Lymph, poc 2.9  0.6 - 3.4    POC LYMPH PERCENT 14.5  10 - 50 (%L)   MID (cbc) 1.6 (*) 0 - 0.9    POC MID % 7.8  0 - 12 (%M)   POC Granulocyte 15.6 (*) 2 - 6.9    Granulocyte percent 77.7  37 - 80 (%G)   RBC 4.03 (*) 4.04 - 5.48 (M/uL)   Hemoglobin 11.1 (*) 12.2 - 16.2 (g/dL)   HCT, POC 08.6 (*) 57.8 - 47.9 (%)   MCV 86.8  80 - 97 (fL)   MCH, POC 27.5  27 - 31.2 (pg)   MCHC 31.7 (*) 31.8 - 35.4 (g/dL)   RDW, POC 46.9     Platelet Count, POC 645 (*) 142 - 424 (K/uL)   MPV 7.8  0 - 99.8 (fL)  POCT URINALYSIS DIPSTICK      Component Value Range   Color, UA yellow     Clarity, UA clear     Glucose, UA neg     Bilirubin, UA small     Ketones, UA 40     Spec Grav, UA 1.020     Blood, UA trace     pH, UA 5.5     Protein, UA 30     Urobilinogen, UA 0.2     Nitrite, UA neg     Leukocytes, UA  Negative    POCT UA - MICROSCOPIC ONLY      Component Value Range   WBC, Ur, HPF, POC 7-11     RBC, urine, microscopic 6-8     Bacteria, U Microscopic 3+     Mucus, UA pos     Epithelial cells, urine per micros 2-3     Crystals, Ur, HPF, POC neg     Casts, Ur, LPF, POC neg'     Yeast, UA neg          Assessment & Plan:   1. Diverticulitis  POCT CBC, POCT urinalysis dipstick, POCT UA - Microscopic Only  2. Abdominal pain, LLQ  CT Abdomen Pelvis W Contrast, cefTRIAXone (ROCEPHIN) injection 1 g,   3. Leucocytosis     I will speak to patient after the results of CT available.  Anticipate admission.

## 2011-11-27 NOTE — Progress Notes (Deleted)
  Subjective:    Patient ID: Amanda [name], female    DOB: [birth year], 51 y.o.   MRN: [id number]  Abdominal Pain Associated symptoms include diarrhea.  Diarrhea  Associated symptoms include abdominal pain.      Review of Systems  Gastrointestinal: Positive for abdominal pain and diarrhea.       Objective:   Physical Exam        Assessment & Plan:

## 2011-11-28 ENCOUNTER — Inpatient Hospital Stay (HOSPITAL_COMMUNITY)
Admission: EM | Admit: 2011-11-28 | Discharge: 2011-12-05 | DRG: 392 | Disposition: A | Discharge status: Home Health | Payer: 59 | Source: Ambulatory Visit | Attending: Surgery | Admitting: Surgery

## 2011-11-28 ENCOUNTER — Encounter (HOSPITAL_COMMUNITY): Payer: Self-pay | Admitting: *Deleted

## 2011-11-28 ENCOUNTER — Inpatient Hospital Stay (HOSPITAL_COMMUNITY): Payer: 59

## 2011-11-28 DIAGNOSIS — J90 Pleural effusion, not elsewhere classified: Secondary | ICD-10-CM

## 2011-11-28 DIAGNOSIS — R059 Cough, unspecified: Secondary | ICD-10-CM | POA: Diagnosis not present

## 2011-11-28 DIAGNOSIS — K651 Peritoneal abscess: Secondary | ICD-10-CM

## 2011-11-28 DIAGNOSIS — K5732 Diverticulitis of large intestine without perforation or abscess without bleeding: Principal | ICD-10-CM | POA: Diagnosis present

## 2011-11-28 DIAGNOSIS — Z87891 Personal history of nicotine dependence: Secondary | ICD-10-CM

## 2011-11-28 DIAGNOSIS — I2510 Atherosclerotic heart disease of native coronary artery without angina pectoris: Secondary | ICD-10-CM | POA: Diagnosis present

## 2011-11-28 DIAGNOSIS — K630 Abscess of intestine: Secondary | ICD-10-CM | POA: Diagnosis present

## 2011-11-28 DIAGNOSIS — R05 Cough: Secondary | ICD-10-CM | POA: Diagnosis not present

## 2011-11-28 DIAGNOSIS — D72829 Elevated white blood cell count, unspecified: Secondary | ICD-10-CM | POA: Diagnosis present

## 2011-11-28 HISTORY — DX: Pleural effusion, not elsewhere classified: J90

## 2011-11-28 LAB — POCT PREGNANCY, URINE: Preg Test, Ur: NEGATIVE

## 2011-11-28 LAB — CBC
HCT: 33.5 % — ABNORMAL LOW (ref 36.0–46.0)
Hemoglobin: 11.3 g/dL — ABNORMAL LOW (ref 12.0–15.0)
MCH: 29.1 pg (ref 26.0–34.0)
MCHC: 33.7 g/dL (ref 30.0–36.0)
MCHC: 33.4 g/dL (ref 30.0–36.0)
Platelets: 516 10*3/uL — ABNORMAL HIGH (ref 150–400)
RDW: 14.4 % (ref 11.5–15.5)

## 2011-11-28 LAB — URINALYSIS, ROUTINE W REFLEX MICROSCOPIC
Glucose, UA: NEGATIVE mg/dL
Ketones, ur: >80 mg/dL — AB
Nitrite: NEGATIVE
pH: 5.5 (ref 5.0–8.0)

## 2011-11-28 LAB — DIFFERENTIAL
Basophils Relative: 0 % (ref 0–1)
Eosinophils Absolute: 0.1 10*3/uL (ref 0.0–0.7)
Monocytes Absolute: 2 10*3/uL — ABNORMAL HIGH (ref 0.1–1.0)
Monocytes Relative: 10 % (ref 3–12)

## 2011-11-28 LAB — COMPREHENSIVE METABOLIC PANEL
Albumin: 2.8 g/dL — ABNORMAL LOW (ref 3.5–5.2)
BUN: 7 mg/dL (ref 6–23)
Calcium: 9 mg/dL (ref 8.4–10.5)
Creatinine, Ser: 0.62 mg/dL (ref 0.50–1.10)
Total Protein: 7.4 g/dL (ref 6.0–8.3)

## 2011-11-28 LAB — URINE MICROSCOPIC-ADD ON

## 2011-11-28 MED ORDER — FENTANYL CITRATE 0.05 MG/ML IJ SOLN
INTRAMUSCULAR | Status: AC
  Filled 2011-11-28: qty 4

## 2011-11-28 MED ORDER — HYDROMORPHONE HCL PF 1 MG/ML IJ SOLN
1.0000 mg | Freq: Once | INTRAMUSCULAR | Status: AC
  Administered 2011-11-28: 1 mg via INTRAVENOUS
  Filled 2011-11-28: qty 1

## 2011-11-28 MED ORDER — PIPERACILLIN-TAZOBACTAM 3.375 G IVPB
3.3750 g | Freq: Once | INTRAVENOUS | Status: AC
  Administered 2011-11-28: 3.375 g via INTRAVENOUS
  Filled 2011-11-28: qty 50

## 2011-11-28 MED ORDER — FENTANYL CITRATE 0.05 MG/ML IJ SOLN
INTRAMUSCULAR | Status: AC | PRN
  Administered 2011-11-28 (×2): 50 ug via INTRAVENOUS

## 2011-11-28 MED ORDER — PANTOPRAZOLE SODIUM 40 MG IV SOLR
40.0000 mg | Freq: Every day | INTRAVENOUS | Status: DC
  Administered 2011-11-28 – 2011-12-02 (×5): 40 mg via INTRAVENOUS
  Filled 2011-11-28 (×6): qty 40

## 2011-11-28 MED ORDER — MIDAZOLAM HCL 5 MG/5ML IJ SOLN
INTRAMUSCULAR | Status: AC | PRN
  Administered 2011-11-28 (×2): 1 mg via INTRAVENOUS

## 2011-11-28 MED ORDER — SODIUM CHLORIDE 0.9 % IV BOLUS (SEPSIS)
1000.0000 mL | Freq: Once | INTRAVENOUS | Status: AC
  Administered 2011-11-28: 1000 mL via INTRAVENOUS

## 2011-11-28 MED ORDER — METOPROLOL SUCCINATE ER 25 MG PO TB24
25.0000 mg | ORAL_TABLET | Freq: Every evening | ORAL | Status: DC
  Administered 2011-11-28 – 2011-12-04 (×7): 25 mg via ORAL
  Filled 2011-11-28 (×8): qty 1

## 2011-11-28 MED ORDER — BIOTENE DRY MOUTH MT LIQD
15.0000 mL | Freq: Two times a day (BID) | OROMUCOSAL | Status: DC
  Administered 2011-11-28 – 2011-12-05 (×13): 15 mL via OROMUCOSAL

## 2011-11-28 MED ORDER — SODIUM CHLORIDE 0.9 % IV SOLN
1.0000 g | INTRAVENOUS | Status: AC
  Administered 2011-11-28: 1 g via INTRAVENOUS
  Filled 2011-11-28: qty 1

## 2011-11-28 MED ORDER — HYDROMORPHONE HCL PF 1 MG/ML IJ SOLN
2.0000 mg | INTRAMUSCULAR | Status: DC | PRN
  Administered 2011-11-28 – 2011-11-29 (×8): 2 mg via INTRAVENOUS
  Filled 2011-11-28 (×8): qty 2

## 2011-11-28 MED ORDER — ONDANSETRON HCL 4 MG/2ML IJ SOLN
4.0000 mg | Freq: Four times a day (QID) | INTRAMUSCULAR | Status: DC | PRN
  Administered 2011-11-28 – 2011-12-01 (×7): 4 mg via INTRAVENOUS
  Filled 2011-11-28 (×7): qty 2

## 2011-11-28 MED ORDER — ENOXAPARIN SODIUM 40 MG/0.4ML ~~LOC~~ SOLN
40.0000 mg | SUBCUTANEOUS | Status: DC
Start: 2011-11-29 — End: 2011-12-05
  Administered 2011-11-29 – 2011-12-05 (×7): 40 mg via SUBCUTANEOUS
  Filled 2011-11-28 (×9): qty 0.4

## 2011-11-28 MED ORDER — MIDAZOLAM HCL 2 MG/2ML IJ SOLN
INTRAMUSCULAR | Status: AC
  Filled 2011-11-28: qty 4

## 2011-11-28 MED ORDER — CHLORHEXIDINE GLUCONATE 0.12 % MT SOLN
15.0000 mL | Freq: Two times a day (BID) | OROMUCOSAL | Status: DC
  Administered 2011-11-28 – 2011-12-05 (×14): 15 mL via OROMUCOSAL
  Filled 2011-11-28 (×12): qty 15

## 2011-11-28 MED ORDER — POTASSIUM CHLORIDE IN NACL 20-0.9 MEQ/L-% IV SOLN
INTRAVENOUS | Status: DC
  Administered 2011-11-28 – 2011-11-30 (×5): via INTRAVENOUS
  Administered 2011-12-01 (×2): 100 mL/h via INTRAVENOUS
  Administered 2011-12-02 – 2011-12-05 (×7): via INTRAVENOUS
  Filled 2011-11-28 (×21): qty 1000

## 2011-11-28 MED ORDER — SODIUM CHLORIDE 0.9 % IV SOLN
1.0000 g | INTRAVENOUS | Status: DC
  Administered 2011-11-29 – 2011-12-05 (×7): 1 g via INTRAVENOUS
  Filled 2011-11-28 (×7): qty 1

## 2011-11-28 NOTE — ED Notes (Signed)
Patient currently sitting up in bed; no respiratory or acute distress noted.  Patient updated on plan of care; informed patient that we are waiting on urine results to come back and that EDP has made a consult to general surgery.  Patient has no other questions or concerns at this time; will continue to monitor.

## 2011-11-28 NOTE — H&P (Signed)
Amanda [name] is an 51 y.o. female.   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 She also has had low-grade fevers. She denies nausea or vomiting. Her bowel movements are nonbloody. She has had diverticulitis in the past. She is otherwise without complaints. Her pain is moderate. It is described as in an ache  Past Medical History  Diagnosis Date  . Tachyarrhythmia 2006    s/p ablation  . Coronary artery disease     cardiologist- dr Sharlot Gowda taylor- last visit 2 yrs ago - no problems since ablation of tachpalpitations  . Congenital patella maltracking right knee  diverticulitis  Past Surgical History  Procedure Date  . Shoulder arthroscopy w/ rotator cuff repair 2009- repair bicep    right shoulder   . Left small fingernail removed 2006  . Cardiac electrophysiology study and ablation 2006    tachycardia  . Ectopic pregnancy surgery 1990's  . Exploratory laparotomy GYN fertility 1990's  . Kneen surgery     No family history on file. Social History:  reports that she quit smoking about a year ago. She has never used smokeless tobacco. She reports that she drinks about 3.6 ounces of alcohol per week. She reports that she does not use illicit drugs.  Allergies: No Known Allergies  Medications Prior to Admission  Medication Dose Route Frequency Provider Last Rate Last Dose  . cefTRIAXone (ROCEPHIN) injection 1 g  1 g Intramuscular Once Dois Davenport, MD   1 g at 11/27/11 1850  . HYDROmorphone (DILAUDID) injection 1 mg  1 mg Intravenous Once Joya Gaskins, MD   1 mg at 11/28/11 0233  . iohexol (OMNIPAQUE) 300 MG/ML solution 100 mL  100 mL Intravenous Once PRN Medication Radiologist, MD   100 mL at 11/27/11 2220  . piperacillin-tazobactam (ZOSYN) IVPB 3.375 g  3.375 g Intravenous  Once Joya Gaskins, MD   3.375 g at 11/28/11 0233  . sodium chloride 0.9 % bolus 1,000 mL  1,000 mL Intravenous Once Joya Gaskins, MD   1,000 mL at 11/28/11 0233   Medications Prior to Admission  Medication Sig Dispense Refill  . glucosamine-chondroitin 500-400 MG tablet Take 1 tablet by mouth daily.       . metoprolol succinate (TOPROL-XL) 25 MG 24 hr tablet Take 25 mg by mouth every evening.        . Multiple Vitamin (MULTIVITAMIN) tablet Take 1 tablet by mouth daily.        . Naproxen-Esomeprazole (VIMOVO) 500-20 MG TBEC Take 500 mg by mouth 2 (two) times daily.        Results for orders placed during the hospital encounter of 11/28/11 (from the past 48 hour(s))  CBC     Status: Abnormal   Collection Time   11/28/11  1:04 AM      Component Value Range Comment   WBC 20.2 (*) 4.0 - 10.5 (K/uL)    RBC 3.88  3.87 - 5.11 (MIL/uL)    Hemoglobin 11.3 (*) 12.0 - 15.0 (g/dL)    HCT 16.1 (*) 09.6 - 46.0 (%)    MCV 86.3  78.0 - 100.0 (fL)    MCH 29.1  26.0 - 34.0 (pg)    MCHC 33.7  30.0 - 36.0 (g/dL)    RDW 04.5  40.9 - 81.1 (%)  Platelets 586 (*) 150 - 400 (K/uL)   DIFFERENTIAL     Status: Abnormal   Collection Time   11/28/11  1:04 AM      Component Value Range Comment   Neutrophils Relative 78 (*) 43 - 77 (%)    Neutro Abs 15.7 (*) 1.7 - 7.7 (K/uL)    Lymphocytes Relative 12  12 - 46 (%)    Lymphs Abs 2.4  0.7 - 4.0 (K/uL)    Monocytes Relative 10  3 - 12 (%)    Monocytes Absolute 2.0 (*) 0.1 - 1.0 (K/uL)    Eosinophils Relative 0  0 - 5 (%)    Eosinophils Absolute 0.1  0.0 - 0.7 (K/uL)    Basophils Relative 0  0 - 1 (%)    Basophils Absolute 0.1  0.0 - 0.1 (K/uL)   COMPREHENSIVE METABOLIC PANEL     Status: Abnormal   Collection Time   11/28/11  1:04 AM      Component Value Range Comment   Sodium 135  135 - 145 (mEq/L)    Potassium 4.2  3.5 - 5.1 (mEq/L)    Chloride 96  96 - 112 (mEq/L)    CO2 25  19 - 32 (mEq/L)    Glucose, Bld 99  70 - 99 (mg/dL)    BUN 7  6 - 23  (mg/dL)    Creatinine, Ser 2.13  0.50 - 1.10 (mg/dL)    Calcium 9.0  8.4 - 10.5 (mg/dL)    Total Protein 7.4  6.0 - 8.3 (g/dL)    Albumin 2.8 (*) 3.5 - 5.2 (g/dL)    AST 18  0 - 37 (U/L)    ALT 15  0 - 35 (U/L)    Alkaline Phosphatase 114  39 - 117 (U/L)    Total Bilirubin 0.3  0.3 - 1.2 (mg/dL)    GFR calc non Af Amer >90  >90 (mL/min)    GFR calc Af Amer >90  >90 (mL/min)   URINALYSIS, ROUTINE W REFLEX MICROSCOPIC     Status: Abnormal   Collection Time   11/28/11  2:22 AM      Component Value Range Comment   Color, Urine YELLOW  YELLOW     APPearance CLEAR  CLEAR     Specific Gravity, Urine 1.015  1.005 - 1.030     pH 5.5  5.0 - 8.0     Glucose, UA NEGATIVE  NEGATIVE (mg/dL)    Hgb urine dipstick TRACE (*) NEGATIVE     Bilirubin Urine SMALL (*) NEGATIVE     Ketones, ur >80 (*) NEGATIVE (mg/dL)    Protein, ur 30 (*) NEGATIVE (mg/dL)    Urobilinogen, UA 0.2  0.0 - 1.0 (mg/dL)    Nitrite NEGATIVE  NEGATIVE     Leukocytes, UA NEGATIVE  NEGATIVE    URINE MICROSCOPIC-ADD ON     Status: Abnormal   Collection Time   11/28/11  2:22 AM      Component Value Range Comment   Squamous Epithelial / LPF RARE  RARE     WBC, UA 0-2  <3 (WBC/hpf)    RBC / HPF 0-2  <3 (RBC/hpf)    Bacteria, UA FEW (*) RARE    POCT PREGNANCY, URINE     Status: Normal   Collection Time   11/28/11  2:30 AM      Component Value Range Comment   Preg Test, Ur NEGATIVE  NEGATIVE     Ct Abdomen Pelvis W  Contrast  11/27/2011  *RADIOLOGY REPORT*  Clinical Data: Left lower quadrant pain with leukocytosis.  CT ABDOMEN AND PELVIS WITH CONTRAST  Technique:  Multidetector CT imaging of the abdomen and pelvis was performed following the standard protocol during bolus administration of intravenous contrast.  Contrast: OMNIPAQUE IOHEXOL 300 MG/ML IJ SOLN  Comparison: None available  Findings: There are trace bilateral pleural effusions.  The imaged lung fields are well aerated.  The liver, gallbladder, spleen, adrenal  glands, and kidneys are within normal limits.  Incidental note is made of partial duplication of the left renal collecting system, a normal variant. There is no hydronephrosis.  The pancreas is normal.  There is no biliary ductal dilatation.  There are inflammatory changes in the pelvis, with stranding in the fat in the retroperitoneum of the upper pelvis, and adjacent to the sigmoid colon.  There is diffuse wall thickening of the distal sigmoid colon. A few diverticula are seen in association with the descending colon and sigmoid colon.  Extending superiorly from the sigmoid colon is a large fluid collection with a well-defined enhancing wall and some internal septations along its superior aspect.  The appearance of this and reported history of leukocytosis suggests a focal abscess measuring 8.7 x 8.5 x 9.1 cm.  On image number 93, it appears that the broad ligament of the uterus on the left is stretched over the rim- enhancing fluid collection.  The suspected abscess could be secondary to the sigmoid colon diverticular disease or colitis or a tubo-ovarian abscess.  A definite/discrete left ovary is not visualized.  No right adnexal mass is seen.  The uterus and urinary bladder are deviated to the right, likely due to mass effect from the rim-enhancing fluid collection.  There is a small amount of free fluid in the pelvis.  The terminal ileum and remainder of the small bowel loops appear normal.  Stomach is decompressed.  Retroperitoneal lymph nodes are increased in number and appear reactive.  These include left periaortic lymph nodes measuring up to 9 mm, low density left common iliac lymph node measuring 13 mm. Right common iliac lymph node measuring 12 mm.  Discrete appendix is not visualized.  No evidence of acute appendicitis.  No acute bony abnormality.  IMPRESSION:  1.  Inflammatory changes in the pelvis with a large probable abscess (complex peripherally enhancing fluid collection in the left pelvis)  measuring up to 9 cm. The adjacent sigmoid colon demonstrates diffuse wall thickening and there are a few scattered diverticula.  Most likely potential etiologies for this presumed abscess are acute diverticulitis or colitis versus tubo-ovarian abscess.  Given the patient's age, and the sigmoid colonic wall thickening, after the patient's acute illness resolves, follow-up colonoscopy is suggested to exclude underlying neoplasm.  2.  Lymphadenopathy in the lower abdomen and pelvis is presumably reactive.  3. Trace bilateral pleural effusions.  4.  Duplicated left renal collecting system, an anatomic variant.  Original Report Authenticated By: Britta Mccreedy, M.D.    Review of Systems  Constitutional: Positive for fever. Negative for chills.  HENT: Negative.   Eyes: Negative.   Respiratory: Negative.   Cardiovascular: Negative.   Gastrointestinal: Positive for abdominal pain and diarrhea. Negative for nausea, vomiting and blood in stool.  Genitourinary: Negative.   Musculoskeletal: Negative.   Skin: Negative.   Neurological: Negative.   Endo/Heme/Allergies: Negative.   Psychiatric/Behavioral: Negative.     Blood pressure 115/73, pulse 110, temperature 98.4 F (36.9 C), temperature source Oral, resp. rate 16, last  menstrual period 11/08/2011, SpO2 95.00%. Physical Exam  Constitutional: She is oriented to person, place, and time. She appears well-developed and well-nourished. No distress.  HENT:  Head: Normocephalic and atraumatic.  Right Ear: External ear normal.  Left Ear: External ear normal.  Nose: Nose normal.  Eyes: Conjunctivae are normal. Pupils are equal, round, and reactive to light.  Cardiovascular: Normal rate, regular rhythm, normal heart sounds and intact distal pulses.   No murmur heard. Respiratory: Effort normal and breath sounds normal. No respiratory distress. She has no wheezes.  GI: Soft. Bowel sounds are normal. She exhibits no distension. There is tenderness. There is  guarding.       There is tenderness with guarding which is moderate in the left lower quadrant.  Neurological: She is alert and oriented to person, place, and time.  Skin: Skin is warm and dry. No rash noted. No erythema.  Psychiatric: Her behavior is normal. Judgment normal.     Assessment/Plan Diverticulitis with abscess  I will admit her to the hospital and start her on IV antibiotics. Interventional radiology will be consulted for placement of a percutaneous drain to drain the abscess. I explained this to her in detail. Hopefully this will be successful in draining the abscess and improving the diverticulitis without need for exploration and colostomy. I do however believe that she will need eventual resection of her sigmoid colon which can hopefully be done electively at a later date.  Malacki Mcphearson A 11/28/2011, 3:00 AM

## 2011-11-28 NOTE — ED Notes (Signed)
Informed RN that Amanda [name] has not been delivered from pharmacy and has not been administered; informed RN that pharmacy will be called to send Invanz to floor since patient is being sent upstairs.

## 2011-11-28 NOTE — Progress Notes (Signed)
Patient ID: Amanda [name], female   DOB: [birth year], 51 y.o.   MRN: [id number]    Subjective: Some lower abdominal pain unchanged but relieved with medication.  Objective: Vital signs in last 24 hours: Temp:  [98.1 F (36.7 C)-99.4 F (37.4 C)] 98.1 F (36.7 C) (03/17 0500) Pulse Rate:  [109-112] 109  (03/17 0500) Resp:  [16-18] 17  (03/17 0500) BP: (114-127)/(73-79) 119/77 mmHg (03/17 0500) SpO2:  [95 %-99 %] 98 % (03/17 0500) Weight:  [190 lb 14.7 oz (86.6 kg)-191 lb (86.637 kg)] 190 lb 14.7 oz (86.6 kg) (03/17 0500) Last BM Date: 11/27/11  Intake/Output from previous day:   Intake/Output this shift:    General appearance: alert and no distress GI: abnormal findings:  moderate tenderness in the lower abdomen with some fullness  Lab Results:   [facility] 11/28/11 0735 11/28/11 0104  WBC 18.3* 20.2*  HGB 9.9* 11.3*  HCT 29.6* 33.5*  PLT 516* 586*   BMET  Basename 11/28/11 0104  NA 135  K 4.2  CL 96  CO2 25  GLUCOSE 99  BUN 7  CREATININE 0.62  CALCIUM 9.0     Studies/Results: Ct Abdomen Pelvis W Contrast  11/27/2011  *RADIOLOGY REPORT*  Clinical Data: Left lower quadrant pain with leukocytosis.  CT ABDOMEN AND PELVIS WITH CONTRAST  Technique:  Multidetector CT imaging of the abdomen and pelvis was performed following the standard protocol during bolus administration of intravenous contrast.  Contrast: OMNIPAQUE IOHEXOL 300 MG/ML IJ SOLN  Comparison: None available  Findings: There are trace bilateral pleural effusions.  The imaged lung fields are well aerated.  The liver, gallbladder, spleen, adrenal glands, and kidneys are within normal limits.  Incidental note is made of partial duplication of the left renal collecting system, a normal variant. There is no hydronephrosis.  The pancreas is normal.  There is no biliary ductal dilatation.  There are inflammatory changes in the pelvis, with stranding in the fat in the retroperitoneum of the upper pelvis, and  adjacent to the sigmoid colon.  There is diffuse wall thickening of the distal sigmoid colon. A few diverticula are seen in association with the descending colon and sigmoid colon.  Extending superiorly from the sigmoid colon is a large fluid collection with a well-defined enhancing wall and some internal septations along its superior aspect.  The appearance of this and reported history of leukocytosis suggests a focal abscess measuring 8.7 x 8.5 x 9.1 cm.  On image number 31, it appears that the broad ligament of the uterus on the left is stretched over the rim- enhancing fluid collection.  The suspected abscess could be secondary to the sigmoid colon diverticular disease or colitis or a tubo-ovarian abscess.  A definite/discrete left ovary is not visualized.  No right adnexal mass is seen.  The uterus and urinary bladder are deviated to the right, likely due to mass effect from the rim-enhancing fluid collection.  There is a small amount of free fluid in the pelvis.  The terminal ileum and remainder of the small bowel loops appear normal.  Stomach is decompressed.  Retroperitoneal lymph nodes are increased in number and appear reactive.  These include left periaortic lymph nodes measuring up to 9 mm, low density left common iliac lymph node measuring 13 mm. Right common iliac lymph node measuring 12 mm.  Discrete appendix is not visualized.  No evidence of acute appendicitis.  No acute bony abnormality.  IMPRESSION:  1.  Inflammatory changes in the pelvis with a  large probable abscess (complex peripherally enhancing fluid collection in the left pelvis) measuring up to 9 cm. The adjacent sigmoid colon demonstrates diffuse wall thickening and there are a few scattered diverticula.  Most likely potential etiologies for this presumed abscess are acute diverticulitis or colitis versus tubo-ovarian abscess.  Given the patient's age, and the sigmoid colonic wall thickening, after the patient's acute illness resolves,  follow-up colonoscopy is suggested to exclude underlying neoplasm.  2.  Lymphadenopathy in the lower abdomen and pelvis is presumably reactive.  3. Trace bilateral pleural effusions.  4.  Duplicated left renal collecting system, an anatomic variant.  Original Report Authenticated By: Britta Mccreedy, M.D.    Anti-infectives: Anti-infectives     Start     Dose/Rate Route Frequency Ordered Stop   11/29/11 0400   ertapenem (INVANZ) 1 g in sodium chloride 0.9 % 50 mL IVPB        1 g 100 mL/hr over 30 Minutes Intravenous Every 24 hours 11/28/11 0307     11/28/11 0330   ertapenem (INVANZ) 1 g in sodium chloride 0.9 % 50 mL IVPB        1 g 100 mL/hr over 30 Minutes Intravenous To [facility] 11/28/11 0319 11/28/11 0532   11/28/11 0230  piperacillin-tazobactam (ZOSYN) IVPB 3.375 g       3.375 g 12.5 mL/hr over 240 Minutes Intravenous  Once 11/28/11 0219 11/28/11 1610          Assessment/Plan: Pelvic abscess very likely secondary to diverticulitis. Stable. Continue IV antibiotics. Scheduled for percutaneous drainage today.    LOS: 0 days    Amanda [name] T 11/28/2011

## 2011-11-28 NOTE — ED Notes (Signed)
Gave report to Harriett Sine, RN on 5100; no further questions or concerns from RN -- informed RN that she can call back with any questions upon patient arrival to floor.  Preparing patient for transport.  Patient being transported upstairs by nurse tech.

## 2011-11-28 NOTE — Procedures (Signed)
Successful CT fluoro guided LLQ pelvic abscess drain(23fr) 140cc pus aspirated GS CX sent No comp Stable Full report in PACS

## 2011-11-28 NOTE — H&P (Signed)
Amanda [name] is an 51 y.o. female.   Chief Complaint: abdominal abscess HPI: abdominal pain, fever, diverticular pelvic abscess by CT  Past Medical History  Diagnosis Date  . Tachyarrhythmia 2006    s/p ablation  . Coronary artery disease     cardiologist- dr Sharlot Gowda taylor- last visit 2 yrs ago - no problems since ablation of tachpalpitations  . Congenital patella maltracking right knee    Past Surgical History  Procedure Date  . Shoulder arthroscopy w/ rotator cuff repair 2009- repair bicep    right shoulder   . Left small fingernail removed 2006  . Cardiac electrophysiology study and ablation 2006    tachycardia  . Ectopic pregnancy surgery 1990's  . Exploratory laparotomy GYN fertility 1990's  . Kneen surgery     No family history on file. Social History:  reports that she quit smoking about a year ago. She has never used smokeless tobacco. She reports that she drinks about 3.6 ounces of alcohol per week. She reports that she does not use illicit drugs.  Allergies: No Known Allergies  Medications Prior to Admission  Medication Dose Route Frequency Provider Last Rate Last Dose  . 0.9 % NaCl with KCl 20 mEq/ L  infusion   Intravenous Continuous Shelly Rubenstein, MD 100 mL/hr at 11/28/11 0502    . antiseptic oral rinse (BIOTENE) solution 15 mL  15 mL Mouth Rinse q12n4p Shelly Rubenstein, MD   15 mL at 11/28/11 1200  . chlorhexidine (PERIDEX) 0.12 % solution 15 mL  15 mL Mouth Rinse BID Shelly Rubenstein, MD   15 mL at 11/28/11 0800  . enoxaparin (LOVENOX) injection 40 mg  40 mg Subcutaneous Q24H Shelly Rubenstein, MD      . ertapenem [facility]) 1 g in sodium chloride 0.9 % 50 mL IVPB  1 g Intravenous Q24H Shelly Rubenstein, MD      . ertapenem [facility]) 1 g in sodium chloride 0.9 % 50 mL IVPB  1 g Intravenous To [facility] Joya Gaskins, MD   1 g at 11/28/11 0502  . fentaNYL (SUBLIMAZE) 0.05 MG/ML injection           . fentaNYL (SUBLIMAZE) injection   Intravenous PRN  Casimiro Needle T. Darcie Mellone, MD   50 mcg at 11/28/11 1255  . HYDROmorphone (DILAUDID) injection 1 mg  1 mg Intravenous Once Joya Gaskins, MD   1 mg at 11/28/11 0233  . HYDROmorphone (DILAUDID) injection 2 mg  2 mg Intravenous Q1H PRN Shelly Rubenstein, MD   2 mg at 11/28/11 0806  . iohexol (OMNIPAQUE) 300 MG/ML solution 100 mL  100 mL Intravenous Once PRN Medication Radiologist, MD   100 mL at 11/27/11 2220  . metoprolol succinate (TOPROL-XL) 24 hr tablet 25 mg  25 mg Oral QPM Shelly Rubenstein, MD      . midazolam (VERSED) 2 MG/2ML injection           . midazolam (VERSED) 5 MG/5ML injection   Intravenous PRN Irma Roulhac T. Cristhian Vanhook, MD   1 mg at 11/28/11 1256  . ondansetron (ZOFRAN) injection 4 mg  4 mg Intravenous Q6H PRN Shelly Rubenstein, MD      . pantoprazole (PROTONIX) injection 40 mg  40 mg Intravenous QHS Shelly Rubenstein, MD      . piperacillin-tazobactam (ZOSYN) IVPB 3.375 g  3.375 g Intravenous Once Joya Gaskins, MD   3.375 g at 11/28/11 0233  . sodium chloride 0.9 % bolus 1,000  mL  1,000 mL Intravenous Once Joya Gaskins, MD   1,000 mL at 11/28/11 0233   Medications Prior to Admission  Medication Sig Dispense Refill  . glucosamine-chondroitin 500-400 MG tablet Take 1 tablet by mouth daily.       . metoprolol succinate (TOPROL-XL) 25 MG 24 hr tablet Take 25 mg by mouth every evening.        . Multiple Vitamin (MULTIVITAMIN) tablet Take 1 tablet by mouth daily.        . Naproxen-Esomeprazole (VIMOVO) 500-20 MG TBEC Take 500 mg by mouth 2 (two) times daily.        Results for orders placed during the hospital encounter of 11/28/11 (from the past 48 hour(s))  CBC     Status: Abnormal   Collection Time   11/28/11  1:04 AM      Component Value Range Comment   WBC 20.2 (*) 4.0 - 10.5 (K/uL)    RBC 3.88  3.87 - 5.11 (MIL/uL)    Hemoglobin 11.3 (*) 12.0 - 15.0 (g/dL)    HCT 16.1 (*) 09.6 - 46.0 (%)    MCV 86.3  78.0 - 100.0 (fL)    MCH 29.1  26.0 - 34.0 (pg)    MCHC 33.7  30.0 -  36.0 (g/dL)    RDW 04.5  40.9 - 81.1 (%)    Platelets 586 (*) 150 - 400 (K/uL)   DIFFERENTIAL     Status: Abnormal   Collection Time   11/28/11  1:04 AM      Component Value Range Comment   Neutrophils Relative 78 (*) 43 - 77 (%)    Neutro Abs 15.7 (*) 1.7 - 7.7 (K/uL)    Lymphocytes Relative 12  12 - 46 (%)    Lymphs Abs 2.4  0.7 - 4.0 (K/uL)    Monocytes Relative 10  3 - 12 (%)    Monocytes Absolute 2.0 (*) 0.1 - 1.0 (K/uL)    Eosinophils Relative 0  0 - 5 (%)    Eosinophils Absolute 0.1  0.0 - 0.7 (K/uL)    Basophils Relative 0  0 - 1 (%)    Basophils Absolute 0.1  0.0 - 0.1 (K/uL)   COMPREHENSIVE METABOLIC PANEL     Status: Abnormal   Collection Time   11/28/11  1:04 AM      Component Value Range Comment   Sodium 135  135 - 145 (mEq/L)    Potassium 4.2  3.5 - 5.1 (mEq/L)    Chloride 96  96 - 112 (mEq/L)    CO2 25  19 - 32 (mEq/L)    Glucose, Bld 99  70 - 99 (mg/dL)    BUN 7  6 - 23 (mg/dL)    Creatinine, Ser 9.14  0.50 - 1.10 (mg/dL)    Calcium 9.0  8.4 - 10.5 (mg/dL)    Total Protein 7.4  6.0 - 8.3 (g/dL)    Albumin 2.8 (*) 3.5 - 5.2 (g/dL)    AST 18  0 - 37 (U/L)    ALT 15  0 - 35 (U/L)    Alkaline Phosphatase 114  39 - 117 (U/L)    Total Bilirubin 0.3  0.3 - 1.2 (mg/dL)    GFR calc non Af Amer >90  >90 (mL/min)    GFR calc Af Amer >90  >90 (mL/min)   URINALYSIS, ROUTINE W REFLEX MICROSCOPIC     Status: Abnormal   Collection Time   11/28/11  2:22 AM  Component Value Range Comment   Color, Urine YELLOW  YELLOW     APPearance CLEAR  CLEAR     Specific Gravity, Urine 1.015  1.005 - 1.030     pH 5.5  5.0 - 8.0     Glucose, UA NEGATIVE  NEGATIVE (mg/dL)    Hgb urine dipstick TRACE (*) NEGATIVE     Bilirubin Urine SMALL (*) NEGATIVE     Ketones, ur >80 (*) NEGATIVE (mg/dL)    Protein, ur 30 (*) NEGATIVE (mg/dL)    Urobilinogen, UA 0.2  0.0 - 1.0 (mg/dL)    Nitrite NEGATIVE  NEGATIVE     Leukocytes, UA NEGATIVE  NEGATIVE    URINE MICROSCOPIC-ADD ON     Status:  Abnormal   Collection Time   11/28/11  2:22 AM      Component Value Range Comment   Squamous Epithelial / LPF RARE  RARE     WBC, UA 0-2  <3 (WBC/hpf)    RBC / HPF 0-2  <3 (RBC/hpf)    Bacteria, UA FEW (*) RARE    POCT PREGNANCY, URINE     Status: Normal   Collection Time   11/28/11  2:30 AM      Component Value Range Comment   Preg Test, Ur NEGATIVE  NEGATIVE    CBC     Status: Abnormal   Collection Time   11/28/11  7:35 AM      Component Value Range Comment   WBC 18.3 (*) 4.0 - 10.5 (K/uL)    RBC 3.42 (*) 3.87 - 5.11 (MIL/uL)    Hemoglobin 9.9 (*) 12.0 - 15.0 (g/dL)    HCT 16.1 (*) 09.6 - 46.0 (%)    MCV 86.5  78.0 - 100.0 (fL)    MCH 28.9  26.0 - 34.0 (pg)    MCHC 33.4  30.0 - 36.0 (g/dL)    RDW 04.5  40.9 - 81.1 (%)    Platelets 516 (*) 150 - 400 (K/uL)    Ct Abdomen Pelvis W Contrast  11/27/2011  *RADIOLOGY REPORT*  Clinical Data: Left lower quadrant pain with leukocytosis.  CT ABDOMEN AND PELVIS WITH CONTRAST  Technique:  Multidetector CT imaging of the abdomen and pelvis was performed following the standard protocol during bolus administration of intravenous contrast.  Contrast: OMNIPAQUE IOHEXOL 300 MG/ML IJ SOLN  Comparison: None available  Findings: There are trace bilateral pleural effusions.  The imaged lung fields are well aerated.  The liver, gallbladder, spleen, adrenal glands, and kidneys are within normal limits.  Incidental note is made of partial duplication of the left renal collecting system, a normal variant. There is no hydronephrosis.  The pancreas is normal.  There is no biliary ductal dilatation.  There are inflammatory changes in the pelvis, with stranding in the fat in the retroperitoneum of the upper pelvis, and adjacent to the sigmoid colon.  There is diffuse wall thickening of the distal sigmoid colon. A few diverticula are seen in association with the descending colon and sigmoid colon.  Extending superiorly from the sigmoid colon is a large fluid  collection with a well-defined enhancing wall and some internal septations along its superior aspect.  The appearance of this and reported history of leukocytosis suggests a focal abscess measuring 8.7 x 8.5 x 9.1 cm.  On image number 77, it appears that the broad ligament of the uterus on the left is stretched over the rim- enhancing fluid collection.  The suspected abscess could be secondary to the  sigmoid colon diverticular disease or colitis or a tubo-ovarian abscess.  A definite/discrete left ovary is not visualized.  No right adnexal mass is seen.  The uterus and urinary bladder are deviated to the right, likely due to mass effect from the rim-enhancing fluid collection.  There is a small amount of free fluid in the pelvis.  The terminal ileum and remainder of the small bowel loops appear normal.  Stomach is decompressed.  Retroperitoneal lymph nodes are increased in number and appear reactive.  These include left periaortic lymph nodes measuring up to 9 mm, low density left common iliac lymph node measuring 13 mm. Right common iliac lymph node measuring 12 mm.  Discrete appendix is not visualized.  No evidence of acute appendicitis.  No acute bony abnormality.  IMPRESSION:  1.  Inflammatory changes in the pelvis with a large probable abscess (complex peripherally enhancing fluid collection in the left pelvis) measuring up to 9 cm. The adjacent sigmoid colon demonstrates diffuse wall thickening and there are a few scattered diverticula.  Most likely potential etiologies for this presumed abscess are acute diverticulitis or colitis versus tubo-ovarian abscess.  Given the patient's age, and the sigmoid colonic wall thickening, after the patient's acute illness resolves, follow-up colonoscopy is suggested to exclude underlying neoplasm.  2.  Lymphadenopathy in the lower abdomen and pelvis is presumably reactive.  3. Trace bilateral pleural effusions.  4.  Duplicated left renal collecting system, an anatomic  variant.  Original Report Authenticated By: Britta Mccreedy, M.D.    Review of Systems  Constitutional: Positive for fever and chills.  Gastrointestinal: Positive for abdominal pain.  Genitourinary: Positive for flank pain.    Blood pressure 102/57, pulse 102, temperature 97.7 F (36.5 C), temperature source Oral, resp. rate 14, height 5' 7.5" (1.715 m), weight 190 lb 14.7 oz (86.6 kg), last menstrual period 11/08/2011, SpO2 98.00%. Physical Exam  Constitutional: She appears well-developed and well-nourished. No distress.  GI: There is tenderness. There is rebound and guarding.     Assessment/Plan Pelvic probable diverticular abscess plan for Ct guided drain  Consent obtained  Demeisha Geraghty T. 11/28/2011, 1:15 PM

## 2011-11-28 NOTE — ED Notes (Signed)
Attempted to call report; was told that the nurse Harriett Sine) is currently busy and would call me back in 5 minutes.  Left name and number for RN.

## 2011-11-28 NOTE — ED Provider Notes (Signed)
History     CSN: [id number]  Arrival date & time 11/28/11  0017   First MD Initiated Contact with Patient 11/28/11 0217      Chief Complaint  Patient presents with  . Abdominal Pain     Patient is a 51 y.o. female presenting with abdominal pain.  Abdominal Pain The primary symptoms of the illness include abdominal pain, fatigue, nausea and diarrhea. The primary symptoms of the illness do not include vomiting or dysuria. The current episode started more than 2 days ago. The onset of the illness was gradual. The problem has been gradually improving.  Additional symptoms associated with the illness include chills.  nothing improves her symptoms Palpation of abdomen worsens her symptoms  Pt reports onset of LLQ abd pain several days ago Seen by urgent care within past 24 hours, given antibiotics (unsure which med) and had CT scan done Sent here for further evaluation Denies cough She reports fever No cp reported  Past Medical History  Diagnosis Date  . Tachyarrhythmia 2006    s/p ablation  . Coronary artery disease     cardiologist- dr Sharlot Gowda taylor- last visit 2 yrs ago - no problems since ablation of tachpalpitations  . Congenital patella maltracking right knee    Past Surgical History  Procedure Date  . Shoulder arthroscopy w/ rotator cuff repair 2009- repair bicep    right shoulder   . Left small fingernail removed 2006  . Cardiac electrophysiology study and ablation 2006    tachycardia  . Ectopic pregnancy surgery 1990's  . Exploratory laparotomy GYN fertility 1990's  . Kneen surgery     No family history on file.  History  Substance Use Topics  . Smoking status: Former Smoker    Quit date: 11/27/2010  . Smokeless tobacco: Never Used  . Alcohol Use: 3.6 oz/week    6 Glasses of wine per week    OB History    Grav Para Term Preterm Abortions TAB SAB Ect Mult Living                  Review of Systems  Constitutional: Positive for chills and fatigue.    Gastrointestinal: Positive for nausea, abdominal pain and diarrhea. Negative for vomiting.  Genitourinary: Negative for dysuria.  All other systems reviewed and are negative.    Allergies  Review of patient's allergies indicates no known allergies.  Home Medications   Current Outpatient Rx  Name Route Sig Dispense Refill  . GLUCOSAMINE-CHONDROITIN 500-400 MG PO TABS Oral Take 1 tablet by mouth daily.     Marland Kitchen METOPROLOL SUCCINATE ER 25 MG PO TB24 Oral Take 25 mg by mouth every evening.      Marland Kitchen ONE-DAILY MULTI VITAMINS PO TABS Oral Take 1 tablet by mouth daily.      Marland Kitchen NAPROXEN-ESOMEPRAZOLE 500-20 MG PO TBEC Oral Take 500 mg by mouth 2 (two) times daily.      BP 127/79  Pulse 112  Temp(Src) 98.1 F (36.7 C) (Oral)  Resp 18  SpO2 99%  LMP 11/08/2011  Physical Exam CONSTITUTIONAL: Well developed/well nourished HEAD AND FACE: Normocephalic/atraumatic EYES: EOMI/PERRL ENMT: Mucous membranes moist NECK: supple no meningeal signs SPINE:entire spine nontender CV: S1/S2 noted, no murmurs/rubs/gallops noted LUNGS: Lungs are clear to auscultation bilaterally, no apparent distress ABDOMEN: soft, LLQ tenderness noted.  No rebound is noted.  Tenderness is moderate GU:no cva tenderness NEURO: Pt is awake/alert, moves all extremitiesx4 EXTREMITIES: pulses normal, full ROM SKIN: warm, color normal PSYCH: no abnormalities of  mood noted  ED Course  Procedures   Labs Reviewed  CBC - Abnormal; Notable for the following:    WBC 20.2 (*)    Hemoglobin 11.3 (*)    HCT 33.5 (*)    Platelets 586 (*)    All other components within normal limits  DIFFERENTIAL - Abnormal; Notable for the following:    Neutrophils Relative 78 (*)    Neutro Abs 15.7 (*)    Monocytes Absolute 2.0 (*)    All other components within normal limits  COMPREHENSIVE METABOLIC PANEL - Abnormal; Notable for the following:    Albumin 2.8 (*)    All other components within normal limits  URINALYSIS, ROUTINE W REFLEX  MICROSCOPIC   Ct Abdomen Pelvis W Contrast  11/27/2011  *RADIOLOGY REPORT*  Clinical Data: Left lower quadrant pain with leukocytosis.  CT ABDOMEN AND PELVIS WITH CONTRAST  Technique:  Multidetector CT imaging of the abdomen and pelvis was performed following the standard protocol during bolus administration of intravenous contrast.  Contrast: OMNIPAQUE IOHEXOL 300 MG/ML IJ SOLN  Comparison: None available  Findings: There are trace bilateral pleural effusions.  The imaged lung fields are well aerated.  The liver, gallbladder, spleen, adrenal glands, and kidneys are within normal limits.  Incidental note is made of partial duplication of the left renal collecting system, a normal variant. There is no hydronephrosis.  The pancreas is normal.  There is no biliary ductal dilatation.  There are inflammatory changes in the pelvis, with stranding in the fat in the retroperitoneum of the upper pelvis, and adjacent to the sigmoid colon.  There is diffuse wall thickening of the distal sigmoid colon. A few diverticula are seen in association with the descending colon and sigmoid colon.  Extending superiorly from the sigmoid colon is a large fluid collection with a well-defined enhancing wall and some internal septations along its superior aspect.  The appearance of this and reported history of leukocytosis suggests a focal abscess measuring 8.7 x 8.5 x 9.1 cm.  On image number 76, it appears that the broad ligament of the uterus on the left is stretched over the rim- enhancing fluid collection.  The suspected abscess could be secondary to the sigmoid colon diverticular disease or colitis or a tubo-ovarian abscess.  A definite/discrete left ovary is not visualized.  No right adnexal mass is seen.  The uterus and urinary bladder are deviated to the right, likely due to mass effect from the rim-enhancing fluid collection.  There is a small amount of free fluid in the pelvis.  The terminal ileum and remainder of the  small bowel loops appear normal.  Stomach is decompressed.  Retroperitoneal lymph nodes are increased in number and appear reactive.  These include left periaortic lymph nodes measuring up to 9 mm, low density left common iliac lymph node measuring 13 mm. Right common iliac lymph node measuring 12 mm.  Discrete appendix is not visualized.  No evidence of acute appendicitis.  No acute bony abnormality.  IMPRESSION:  1.  Inflammatory changes in the pelvis with a large probable abscess (complex peripherally enhancing fluid collection in the left pelvis) measuring up to 9 cm. The adjacent sigmoid colon demonstrates diffuse wall thickening and there are a few scattered diverticula.  Most likely potential etiologies for this presumed abscess are acute diverticulitis or colitis versus tubo-ovarian abscess.  Given the patient's age, and the sigmoid colonic wall thickening, after the patient's acute illness resolves, follow-up colonoscopy is suggested to exclude underlying neoplasm.  2.  Lymphadenopathy in the lower abdomen and pelvis is presumably reactive.  3. Trace bilateral pleural effusions.  4.  Duplicated left renal collecting system, an anatomic variant.  Original Report Authenticated By: Britta Mccreedy, M.D.    2:29 AM Pt with several days of abd pain, prior to arrival CT scan performed that showed large intra-abdominal abscess, abx ordered and made NPO.  Surgery consulted Pt reports h/o diverticulitis  2:36 AM D/w dr Magnus Ivan, will see patient Pt stable at this time   MDM  Nursing notes reviewed and considered in documentation All labs/vitals reviewed and considered Previous records reviewed and considered         Joya Gaskins, MD 11/28/11 0236

## 2011-11-28 NOTE — ED Notes (Signed)
Pt says that she was sent from [facility] Urgent care. Dx with diverticulitis and needs CT/admission. C/O lower abdominal pain that radiates to her back with nausea and diarrhea

## 2011-11-28 NOTE — ED Notes (Signed)
Patient was seen at [facility] on [address] yesterday afternoon; patient was diagnosed with diverticulitis -- patient was sent to the ED for a CT scan and further evaluation; patient has already had CT scan, but was told that she had to go through the ED for admission process.  Patient was told that the CT scan showed pus in her abdomen and the MD informed her that she needed to be admitted for IV antibiotics.  Upon arrival to room, patient changed into gown -- patient already has IV from CT scan.  Patient alert and oriented x4; PERRL present.  Will continue to monitor.

## 2011-11-29 MED ORDER — MORPHINE SULFATE 2 MG/ML IJ SOLN
1.0000 mg | INTRAMUSCULAR | Status: DC | PRN
  Administered 2011-11-29: 2 mg via INTRAVENOUS
  Filled 2011-11-29: qty 1

## 2011-11-29 MED ORDER — HYDROMORPHONE HCL PF 1 MG/ML IJ SOLN: 0.5000 mg | INTRAMUSCULAR | Status: DC | PRN

## 2011-11-29 MED ORDER — HYDROMORPHONE HCL PF 1 MG/ML IJ SOLN
0.5000 mg | INTRAMUSCULAR | Status: DC | PRN
  Administered 2011-11-29 – 2011-12-01 (×11): 2 mg via INTRAVENOUS
  Administered 2011-12-01: 1 mg via INTRAVENOUS
  Administered 2011-12-01: 2 mg via INTRAVENOUS
  Administered 2011-12-02 (×2): 1 mg via INTRAVENOUS
  Administered 2011-12-02 (×4): 2 mg via INTRAVENOUS
  Administered 2011-12-03 (×2): 1 mg via INTRAVENOUS
  Filled 2011-11-29: qty 2
  Filled 2011-11-29: qty 1
  Filled 2011-11-29: qty 2
  Filled 2011-11-29: qty 1
  Filled 2011-11-29 (×2): qty 2
  Filled 2011-11-29: qty 1
  Filled 2011-11-29 (×3): qty 2
  Filled 2011-11-29: qty 1
  Filled 2011-11-29 (×5): qty 2
  Filled 2011-11-29: qty 1
  Filled 2011-11-29 (×4): qty 2

## 2011-11-29 NOTE — Progress Notes (Signed)
Patient ID: Amanda [name], female   DOB: Apr 21, 1961, 51 y.o.   MRN: [id number]    Subjective: Pt not feeling any better today, despite perc drain yesterday.  C/o diarrhea and some nausea.  Objective: Vital signs in last 24 hours: Temp:  [97.7 F (36.5 C)-99.7 F (37.6 C)] 99.7 F (37.6 C) (03/18 0513) Pulse Rate:  [99-128] 106  (03/18 0513) Resp:  [14-20] 18  (03/18 0513) BP: (97-131)/(54-72) 98/56 mmHg (03/18 0513) SpO2:  [92 %-99 %] 93 % (03/18 0513) Last BM Date: 11/28/11  Intake/Output from previous day: 03/17 0701 - 03/18 0700 In: 825 [P.O.:100; I.V.:720] Out: 628 [Urine:500; Drains:125; Stool:3] Intake/Output this shift:    PE: Abd: soft, very tender in LUQ and LLQ.  +BS, mild distention, drain with cloudy serous appearing output. Heart: regular Lungs: CTAB  Lab Results:   Basename 11/28/11 0735 11/28/11 0104  WBC 18.3* 20.2*  HGB 9.9* 11.3*  HCT 29.6* 33.5*  PLT 516* 586*   BMET  Basename 11/28/11 0104  NA 135  K 4.2  CL 96  CO2 25  GLUCOSE 99  BUN 7  CREATININE 0.62  CALCIUM 9.0   PT/INR No results found for this basename: LABPROT:2,INR:2 in the last 72 hours   Studies/Results: Ct Guided Abscess Drain  11/28/2011  *RADIOLOGY REPORT*  Clinical Data: Diverticular abscess, elevated white count, fever, pain  CT FLUOROSCOPY GUIDED LEFT LOWER QUADRANT PELVIC ABSCESS DRAIN PLACEMENT  Date:  11/28/2011 12:15:00  Radiologist:  Judie Petit. Ruel Favors, M.D.  Medications:  2 mg Versed, 100 mcg Fentanyl  Guidance:  CT fluoroscopy  Fluoroscopy time:  3 seconds  Sedation time:  15 minutes  Contrast volume:  None.  Complications:  No immediate  PROCEDURE/FINDINGS:  Informed consent was obtained from the patient following explanation of the procedure, risks, benefits and alternatives. The patient understands, agrees and consents for the procedure. All questions were addressed.  A time out was performed.  Maximal barrier sterile technique utilized including caps, mask, sterile  gowns, sterile gloves, large sterile drape, hand hygiene, and betadine  Previous imaging reviewed.  The patient was positioned supine. Noncontrast localization CT performed through the lower abdomen and pelvis.  The left lower quadrant complex abscess was localized. Under sterile conditions and local anesthesia, CT fluoroscopy was utilized to advance a 17 gauge 6.8 cm access needle into the abscess from an anterior approach.  Syringe aspiration yielded malodorous exudative fluid compatible with abscess.  Sample sent for Gram stain culture.  Guide wire advanced followed by tract dilatation to insert a 10-French drain.  Retention loop formed in the abscess cavity.  Position confirmed with CT fluoroscopy. Syringe aspiration yielded 140 ml of exudative fluid.  Catheter secured with a Prolene suture and connected to external suction bulb.  Sterile dressing applied.  No immediate complication.  The patient tolerated the procedure well.  IMPRESSION: Successful CT fluoroscopy guided left lower quadrant pelvic abscess drain insertion.  Original Report Authenticated By: Judie Petit. Ruel Favors, M.D.   Ct Abdomen Pelvis W Contrast  11/27/2011  *RADIOLOGY REPORT*  Clinical Data: Left lower quadrant pain with leukocytosis.  CT ABDOMEN AND PELVIS WITH CONTRAST  Technique:  Multidetector CT imaging of the abdomen and pelvis was performed following the standard protocol during bolus administration of intravenous contrast.  Contrast: OMNIPAQUE IOHEXOL 300 MG/ML IJ SOLN  Comparison: None available  Findings: There are trace bilateral pleural effusions.  The imaged lung fields are well aerated.  The liver, gallbladder, spleen, adrenal glands, and kidneys are  within normal limits.  Incidental note is made of partial duplication of the left renal collecting system, a normal variant. There is no hydronephrosis.  The pancreas is normal.  There is no biliary ductal dilatation.  There are inflammatory changes in the pelvis, with stranding  in the fat in the retroperitoneum of the upper pelvis, and adjacent to the sigmoid colon.  There is diffuse wall thickening of the distal sigmoid colon. A few diverticula are seen in association with the descending colon and sigmoid colon.  Extending superiorly from the sigmoid colon is a large fluid collection with a well-defined enhancing wall and some internal septations along its superior aspect.  The appearance of this and reported history of leukocytosis suggests a focal abscess measuring 8.7 x 8.5 x 9.1 cm.  On image number 34, it appears that the broad ligament of the uterus on the left is stretched over the rim- enhancing fluid collection.  The suspected abscess could be secondary to the sigmoid colon diverticular disease or colitis or a tubo-ovarian abscess.  A definite/discrete left ovary is not visualized.  No right adnexal mass is seen.  The uterus and urinary bladder are deviated to the right, likely due to mass effect from the rim-enhancing fluid collection.  There is a small amount of free fluid in the pelvis.  The terminal ileum and remainder of the small bowel loops appear normal.  Stomach is decompressed.  Retroperitoneal lymph nodes are increased in number and appear reactive.  These include left periaortic lymph nodes measuring up to 9 mm, low density left common iliac lymph node measuring 13 mm. Right common iliac lymph node measuring 12 mm.  Discrete appendix is not visualized.  No evidence of acute appendicitis.  No acute bony abnormality.  IMPRESSION:  1.  Inflammatory changes in the pelvis with a large probable abscess (complex peripherally enhancing fluid collection in the left pelvis) measuring up to 9 cm. The adjacent sigmoid colon demonstrates diffuse wall thickening and there are a few scattered diverticula.  Most likely potential etiologies for this presumed abscess are acute diverticulitis or colitis versus tubo-ovarian abscess.  Given the patient's age, and the sigmoid colonic wall  thickening, after the patient's acute illness resolves, follow-up colonoscopy is suggested to exclude underlying neoplasm.  2.  Lymphadenopathy in the lower abdomen and pelvis is presumably reactive.  3. Trace bilateral pleural effusions.  4.  Duplicated left renal collecting system, an anatomic variant.  Original Report Authenticated By: Britta Mccreedy, M.D.    Anti-infectives: Anti-infectives     Start     Dose/Rate Route Frequency Ordered Stop   11/29/11 0400   ertapenem (INVANZ) 1 g in sodium chloride 0.9 % 50 mL IVPB        1 g 100 mL/hr over 30 Minutes Intravenous Every 24 hours 11/28/11 0307     11/28/11 0330   ertapenem (INVANZ) 1 g in sodium chloride 0.9 % 50 mL IVPB        1 g 100 mL/hr over 30 Minutes Intravenous [facility] 11/28/11 0319 11/28/11 0532   11/28/11 0230  piperacillin-tazobactam (ZOSYN) IVPB 3.375 g       3.375 g 12.5 mL/hr over 240 Minutes Intravenous  Once 11/28/11 0219 11/28/11 0633           Assessment/Plan  1. Diverticulitis with perf and abscess 2. S/p perc drain  Plan: 1. Cont NPO and bowel rest 2. Cont with drain 3. Cont Invanz 4. If patient does not begin to  improve then she may require OR; however, if she improves hopefully we can continue with conservative management. 5. Check labs in the morning.   LOS: 1 day    Sharaya Boruff E 11/29/2011

## 2011-11-29 NOTE — Progress Notes (Signed)
Utilization review completed. Rameen Gohlke Diane3/18/2013  

## 2011-11-29 NOTE — Progress Notes (Signed)
I have seen and examined the patient and agree with the assessment and plans.  Denim Start A. Edia Pursifull  MD, FACS  

## 2011-11-29 NOTE — Progress Notes (Signed)
Patient complaining of cough,MD on call made aware,no new order given.

## 2011-11-29 NOTE — Progress Notes (Signed)
Subjective: LLQ abscess drain placed 3/17 Pt feels some better. Output good from drain Low grade temp  Objective: Vital signs in last 24 hours: Temp:  [97.7 F (36.5 C)-99.7 F (37.6 C)] 99.7 F (37.6 C) (03/18 0513) Pulse Rate:  [99-128] 106  (03/18 0513) Resp:  [14-20] 18  (03/18 0513) BP: (97-131)/(54-72) 98/56 mmHg (03/18 0513) SpO2:  [92 %-99 %] 93 % (03/18 0513) Last BM Date: 11/28/11  Intake/Output from previous day: 03/17 0701 - 03/18 0700 In: 825 [P.O.:100; I.V.:720] Out: 628 [Urine:500; Drains:125; Stool:3] Intake/Output this shift:    PE:  Low grade temp; VSS Drain intact; output yellow and cloudy 125 cc 3/17 Cx: high wbc; gram + cocci Clean and dry site  Lab Results:   [facility] 11/28/11 0735 11/28/11 0104  WBC 18.3* 20.2*  HGB 9.9* 11.3*  HCT 29.6* 33.5*  PLT 516* 586*   BMET  Basename 11/28/11 0104  NA 135  K 4.2  CL 96  CO2 25  GLUCOSE 99  BUN 7  CREATININE 0.62  CALCIUM 9.0   PT/INR No results found for this basename: LABPROT:2,INR:2 in the last 72 hours ABG No results found for this basename: PHART:2,PCO2:2,PO2:2,HCO3:2 in the last 72 hours  Studies/Results: Ct Guided Abscess Drain  11/28/2011  *RADIOLOGY REPORT*  Clinical Data: Diverticular abscess, elevated white count, fever, pain  CT FLUOROSCOPY GUIDED LEFT LOWER QUADRANT PELVIC ABSCESS DRAIN PLACEMENT  Date:  11/28/2011 12:15:00  Radiologist:  Judie Petit. Ruel Favors, M.D.  Medications:  2 mg Versed, 100 mcg Fentanyl  Guidance:  CT fluoroscopy  Fluoroscopy time:  3 seconds  Sedation time:  15 minutes  Contrast volume:  None.  Complications:  No immediate  PROCEDURE/FINDINGS:  Informed consent was obtained from the patient following explanation of the procedure, risks, benefits and alternatives. The patient understands, agrees and consents for the procedure. All questions were addressed.  A time out was performed.  Maximal barrier sterile technique utilized including caps, mask, sterile gowns,  sterile gloves, large sterile drape, hand hygiene, and betadine  Previous imaging reviewed.  The patient was positioned supine. Noncontrast localization CT performed through the lower abdomen and pelvis.  The left lower quadrant complex abscess was localized. Under sterile conditions and local anesthesia, CT fluoroscopy was utilized to advance a 17 gauge 6.8 cm access needle into the abscess from an anterior approach.  Syringe aspiration yielded malodorous exudative fluid compatible with abscess.  Sample sent for Gram stain culture.  Guide wire advanced followed by tract dilatation to insert a 10-French drain.  Retention loop formed in the abscess cavity.  Position confirmed with CT fluoroscopy. Syringe aspiration yielded 140 ml of exudative fluid.  Catheter secured with a Prolene suture and connected to external suction bulb.  Sterile dressing applied.  No immediate complication.  The patient tolerated the procedure well.  IMPRESSION: Successful CT fluoroscopy guided left lower quadrant pelvic abscess drain insertion.  Original Report Authenticated By: Judie Petit. Ruel Favors, M.D.   Ct Abdomen Pelvis W Contrast  11/27/2011  *RADIOLOGY REPORT*  Clinical Data: Left lower quadrant pain with leukocytosis.  CT ABDOMEN AND PELVIS WITH CONTRAST  Technique:  Multidetector CT imaging of the abdomen and pelvis was performed following the standard protocol during bolus administration of intravenous contrast.  Contrast: OMNIPAQUE IOHEXOL 300 MG/ML IJ SOLN  Comparison: None available  Findings: There are trace bilateral pleural effusions.  The imaged lung fields are well aerated.  The liver, gallbladder, spleen, adrenal glands, and kidneys are within normal limits.  Incidental  note is made of partial duplication of the left renal collecting system, a normal variant. There is no hydronephrosis.  The pancreas is normal.  There is no biliary ductal dilatation.  There are inflammatory changes in the pelvis, with stranding in the  fat in the retroperitoneum of the upper pelvis, and adjacent to the sigmoid colon.  There is diffuse wall thickening of the distal sigmoid colon. A few diverticula are seen in association with the descending colon and sigmoid colon.  Extending superiorly from the sigmoid colon is a large fluid collection with a well-defined enhancing wall and some internal septations along its superior aspect.  The appearance of this and reported history of leukocytosis suggests a focal abscess measuring 8.7 x 8.5 x 9.1 cm.  On image number 72, it appears that the broad ligament of the uterus on the left is stretched over the rim- enhancing fluid collection.  The suspected abscess could be secondary to the sigmoid colon diverticular disease or colitis or a tubo-ovarian abscess.  A definite/discrete left ovary is not visualized.  No right adnexal mass is seen.  The uterus and urinary bladder are deviated to the right, likely due to mass effect from the rim-enhancing fluid collection.  There is a small amount of free fluid in the pelvis.  The terminal ileum and remainder of the small bowel loops appear normal.  Stomach is decompressed.  Retroperitoneal lymph nodes are increased in number and appear reactive.  These include left periaortic lymph nodes measuring up to 9 mm, low density left common iliac lymph node measuring 13 mm. Right common iliac lymph node measuring 12 mm.  Discrete appendix is not visualized.  No evidence of acute appendicitis.  No acute bony abnormality.  IMPRESSION:  1.  Inflammatory changes in the pelvis with a large probable abscess (complex peripherally enhancing fluid collection in the left pelvis) measuring up to 9 cm. The adjacent sigmoid colon demonstrates diffuse wall thickening and there are a few scattered diverticula.  Most likely potential etiologies for this presumed abscess are acute diverticulitis or colitis versus tubo-ovarian abscess.  Given the patient's age, and the sigmoid colonic wall  thickening, after the patient's acute illness resolves, follow-up colonoscopy is suggested to exclude underlying neoplasm.  2.  Lymphadenopathy in the lower abdomen and pelvis is presumably reactive.  3. Trace bilateral pleural effusions.  4.  Duplicated left renal collecting system, an anatomic variant.  Original Report Authenticated By: Britta Mccreedy, M.D.    Anti-infectives: Anti-infectives     Start     Dose/Rate Route Frequency Ordered Stop   11/29/11 0400   ertapenem (INVANZ) 1 g in sodium chloride 0.9 % 50 mL IVPB        1 g 100 mL/hr over 30 Minutes Intravenous Every 24 hours 11/28/11 0307     11/28/11 0330   ertapenem (INVANZ) 1 g in sodium chloride 0.9 % 50 mL IVPB        1 g 100 mL/hr over 30 Minutes Intra[facility]ous [facility] 11/28/11 0319 11/28/11 0532   11/28/11 0230   piperacillin-tazobactam (ZOSYN) IVPB 3.375 g        3.375 g 12.5 mL/hr over 240 Minutes Intravenous  Once 11/28/11 0219 11/28/11 0633          Assessment/Plan: s/p LLQ abscess drain placed 3/17 Will follow Plan per [facility]   Hajer Dwyer A 11/29/2011

## 2011-11-30 LAB — CBC
HCT: 31.2 % — ABNORMAL LOW (ref 36.0–46.0)
MCH: 28.5 pg (ref 26.0–34.0)
MCV: 88.1 fL (ref 78.0–100.0)
Platelets: 528 10*3/uL — ABNORMAL HIGH (ref 150–400)
RBC: 3.54 MIL/uL — ABNORMAL LOW (ref 3.87–5.11)

## 2011-11-30 LAB — BASIC METABOLIC PANEL
BUN: 5 mg/dL — ABNORMAL LOW (ref 6–23)
CO2: 20 mEq/L (ref 19–32)
Calcium: 8.3 mg/dL — ABNORMAL LOW (ref 8.4–10.5)
Chloride: 103 mEq/L (ref 96–112)
Creatinine, Ser: 0.53 mg/dL (ref 0.50–1.10)

## 2011-11-30 SURGERY — Surgical Case
Anesthesia: *Unknown

## 2011-11-30 NOTE — Progress Notes (Signed)
Subjective: LLQ abscess drain placed 3/17 Still has good output altho slowing Feels better    Objective: Vital signs in last 24 hours: Temp:  [98 F (36.7 C)-99.9 F (37.7 C)] 98 F (36.7 C) (03/18 2210) Pulse Rate:  [113-122] 122  (03/18 2210) Resp:  [18-20] 20  (03/18 2210) BP: (94-131)/(56-80) 131/80 mmHg (03/18 2210) SpO2:  [93 %-98 %] 98 % (03/18 2210) Last BM Date: 11/29/11  Intake/Output from previous day: 03/18 0701 - 03/19 0700 In: 2402 [I.V.:2372] Out: 120 [Urine:100; Drains:20] Intake/Output this shift:    PE:  Afeb; VSS Wbc down slightly Gr+ cocci; final still pending 20cc output 3/18 10cc in JP now Site clean and dry  Lab Results:   Basename 11/30/11 0500 11/28/11 0735  WBC 16.0* 18.3*  HGB 10.1* 9.9*  HCT 31.2* 29.6*  PLT 528* 516*   BMET  Basename 11/28/11 0104  NA 135  K 4.2  CL 96  CO2 25  GLUCOSE 99  BUN 7  CREATININE 0.62  CALCIUM 9.0   PT/INR No results found for this basename: LABPROT:2,INR:2 in the last 72 hours ABG No results found for this basename: PHART:2,PCO2:2,PO2:2,HCO3:2 in the last 72 hours  Studies/Results: Ct Guided Abscess Drain  11/28/2011  *RADIOLOGY REPORT*  Clinical Data: Diverticular abscess, elevated white count, fever, pain  CT FLUOROSCOPY GUIDED LEFT LOWER QUADRANT PELVIC ABSCESS DRAIN PLACEMENT  Date:  11/28/2011 12:15:00  Radiologist:  Judie Petit. Ruel Favors, M.D.  Medications:  2 mg Versed, 100 mcg Fentanyl  Guidance:  CT fluoroscopy  Fluoroscopy time:  3 seconds  Sedation time:  15 minutes  Contrast volume:  None.  Complications:  No immediate  PROCEDURE/FINDINGS:  Informed consent was obtained from the patient following explanation of the procedure, risks, benefits and alternatives. The patient understands, agrees and consents for the procedure. All questions were addressed.  A time out was performed.  Maximal barrier sterile technique utilized including caps, mask, sterile gowns, sterile gloves, large sterile  drape, hand hygiene, and betadine  Previous imaging reviewed.  The patient was positioned supine. Noncontrast localization CT performed through the lower abdomen and pelvis.  The left lower quadrant complex abscess was localized. Under sterile conditions and local anesthesia, CT fluoroscopy was utilized to advance a 17 gauge 6.8 cm access needle into the abscess from an anterior approach.  Syringe aspiration yielded malodorous exudative fluid compatible with abscess.  Sample sent for Gram stain culture.  Guide wire advanced followed by tract dilatation to insert a 10-French drain.  Retention loop formed in the abscess cavity.  Position confirmed with CT fluoroscopy. Syringe aspiration yielded 140 ml of exudative fluid.  Catheter secured with a Prolene suture and connected to external suction bulb.  Sterile dressing applied.  No immediate complication.  The patient tolerated the procedure well.  IMPRESSION: Successful CT fluoroscopy guided left lower quadrant pelvic abscess drain insertion.  Original Report Authenticated By: Judie Petit. Ruel Favors, M.D.    Anti-infectives: Anti-infectives     Start     Dose/Rate Route Frequency Ordered Stop   11/29/11 0400   ertapenem (INVANZ) 1 g in sodium chloride 0.9 % 50 mL IVPB        1 g 100 mL/hr over 30 Minutes Intravenous Every 24 hours 11/28/11 0307     11/28/11 0330   ertapenem (INVANZ) 1 g in sodium chloride 0.9 % 50 mL IVPB        1 g 100 mL/hr over 30 Minutes Intra[facility]ous [facility] 11/28/11 0319 11/28/11 0532  11/28/11 0230   piperacillin-tazobactam (ZOSYN) IVPB 3.375 g        3.375 g 12.5 mL/hr over 240 Minutes Intravenous  Once 11/28/11 0219 11/28/11 1610          Assessment/Plan: s/p LLQ abscess drain placed 3/17 Still has high wbc Output diminishing Will follow Plan per [facility]  Amanda [name] A 11/30/2011

## 2011-11-30 NOTE — Progress Notes (Signed)
I have seen and examined the patient and agree with the assessment and plans.  [place] Sorn A. Raegan Winders  MD, FACS  

## 2011-11-30 NOTE — Progress Notes (Signed)
Patient ID: Amanda [name], female   DOB: 01/04/1961, 51 y.o.   MRN: [id number]    Subjective: Pt still with as much pain.  Feels like her pain may be a little different in nature, more c/w pain from drain as opposed to tics pain, but unsure.  Had 4 loose BMs today already, no blood.  No nausea  Objective: Vital signs in last 24 hours: Temp:  [98 F (36.7 C)-99.9 F (37.7 C)] 98 F (36.7 C) (03/18 2210) Pulse Rate:  [113-122] 122  (03/18 2210) Resp:  [18-20] 20  (03/18 2210) BP: (94-131)/(56-80) 131/80 mmHg (03/18 2210) SpO2:  [93 %-98 %] 98 % (03/18 2210) Last BM Date: 11/29/11  Intake/Output from previous day: 03/18 0701 - 03/19 0700 In: 2402 [I.V.:2372] Out: 120 [Urine:100; Drains:20] Intake/Output this shift:    PE: Abd: soft, but still very tender in LLQ, drain with purulent output. (milky) +BS Heart: regular Lungs: CTAB  Lab Results:   Basename 11/30/11 0500 11/28/11 0735  WBC 16.0* 18.3*  HGB 10.1* 9.9*  HCT 31.2* 29.6*  PLT 528* 516*   BMET  Basename 11/30/11 0500 11/28/11 0104  NA 135 135  K 4.2 4.2  CL 103 96  CO2 20 25  GLUCOSE 95 99  BUN 5* 7  CREATININE 0.53 0.62  CALCIUM 8.3* 9.0   PT/INR No results found for this basename: LABPROT:2,INR:2 in the last 72 hours CMP     Component Value Date/Time   NA 135 11/30/2011 0500   K 4.2 11/30/2011 0500   CL 103 11/30/2011 0500   CO2 20 11/30/2011 0500   GLUCOSE 95 11/30/2011 0500   BUN 5* 11/30/2011 0500   CREATININE 0.53 11/30/2011 0500   CALCIUM 8.3* 11/30/2011 0500   PROT 7.4 11/28/2011 0104   ALBUMIN 2.8* 11/28/2011 0104   AST 18 11/28/2011 0104   ALT 15 11/28/2011 0104   ALKPHOS 114 11/28/2011 0104   BILITOT 0.3 11/28/2011 0104   GFRNONAA >90 11/30/2011 0500   GFRAA >90 11/30/2011 0500   Lipase  No results found for this basename: lipase       Studies/Results: Ct Guided Abscess Drain  11/28/2011  *RADIOLOGY REPORT*  Clinical Data: Diverticular abscess, elevated white count, fever, pain  CT  FLUOROSCOPY GUIDED LEFT LOWER QUADRANT PELVIC ABSCESS DRAIN PLACEMENT  Date:  11/28/2011 12:15:00  Radiologist:  Judie Petit. Ruel Favors, M.D.  Medications:  2 mg Versed, 100 mcg Fentanyl  Guidance:  CT fluoroscopy  Fluoroscopy time:  3 seconds  Sedation time:  15 minutes  Contrast volume:  None.  Complications:  No immediate  PROCEDURE/FINDINGS:  Informed consent was obtained from the patient following explanation of the procedure, risks, benefits and alternatives. The patient understands, agrees and consents for the procedure. All questions were addressed.  A time out was performed.  Maximal barrier sterile technique utilized including caps, mask, sterile gowns, sterile gloves, large sterile drape, hand hygiene, and betadine  Previous imaging reviewed.  The patient was positioned supine. Noncontrast localization CT performed through the lower abdomen and pelvis.  The left lower quadrant complex abscess was localized. Under sterile conditions and local anesthesia, CT fluoroscopy was utilized to advance a 17 gauge 6.8 cm access needle into the abscess from an anterior approach.  Syringe aspiration yielded malodorous exudative fluid compatible with abscess.  Sample sent for Gram stain culture.  Guide wire advanced followed by tract dilatation to insert a 10-French drain.  Retention loop formed in the abscess cavity.  Position confirmed with CT  fluoroscopy. Syringe aspiration yielded 140 ml of exudative fluid.  Catheter secured with a Prolene suture and connected to external suction bulb.  Sterile dressing applied.  No immediate complication.  The patient tolerated the procedure well.  IMPRESSION: Successful CT fluoroscopy guided left lower quadrant pelvic abscess drain insertion.  Original Report Authenticated By: Judie Petit. Ruel Favors, M.D.    Anti-infectives: Anti-infectives     Start     Dose/Rate Route Frequency Ordered Stop   11/29/11 0400   ertapenem (INVANZ) 1 g in sodium chloride 0.9 % 50 mL IVPB        1 g 100  mL/hr over 30 Minutes Intravenous Every 24 hours 11/28/11 0307     11/28/11 0330   ertapenem (INVANZ) 1 g in sodium chloride 0.9 % 50 mL IVPB        1 g 100 mL/hr over 30 Minutes Intra[facility]ous [facility] 11/28/11 0319 11/28/11 0532   11/28/11 0230  piperacillin-tazobactam (ZOSYN) IVPB 3.375 g       3.375 g 12.5 mL/hr over 240 Minutes Intravenous  Once 11/28/11 0219 11/28/11 0633           Assessment/Plan  1. Diverticulitis with abscess, s/p drain 2. Leukocytosis  Plan: 1. Will cont NPO x ice and sips still.  Patient is still quite tender. 2. Cont abx therapy and recheck labs in the morning. 3. Follow progress closely.   LOS: 2 days    Ripley Bogosian E 11/30/2011

## 2011-12-01 LAB — CBC
HCT: 29.1 % — ABNORMAL LOW (ref 36.0–46.0)
MCH: 29.1 pg (ref 26.0–34.0)
MCHC: 32.6 g/dL (ref 30.0–36.0)
MCV: 89 fL (ref 78.0–100.0)
Platelets: 437 10*3/uL — ABNORMAL HIGH (ref 150–400)
RDW: 14.8 % (ref 11.5–15.5)
WBC: 10.3 10*3/uL (ref 4.0–10.5)

## 2011-12-01 LAB — CULTURE, ROUTINE-ABSCESS

## 2011-12-01 MED ORDER — DM-GUAIFENESIN ER 30-600 MG PO TB12
1.0000 | ORAL_TABLET | Freq: Two times a day (BID) | ORAL | Status: DC
  Administered 2011-12-01 – 2011-12-03 (×5): 1 via ORAL
  Filled 2011-12-01 (×10): qty 1

## 2011-12-01 MED ORDER — BOOST / RESOURCE BREEZE PO LIQD
1.0000 | Freq: Three times a day (TID) | ORAL | Status: DC
  Administered 2011-12-01 – 2011-12-03 (×3): 1 via ORAL

## 2011-12-01 NOTE — Progress Notes (Signed)
  Subjective: LLQ drain placed3/17 Final cx still pending Pt some better Output still significant  Objective: Vital signs in last 24 hours: Temp:  [98.8 F (37.1 C)-99.4 F (37.4 C)] 99.4 F (37.4 C) (03/20 0538) Pulse Rate:  [107-116] 107  (03/20 0538) Resp:  [16-20] 16  (03/20 0538) BP: (117-135)/(62-80) 119/70 mmHg (03/20 0538) SpO2:  [93 %-99 %] 93 % (03/20 0538) Last BM Date: 11/30/11  Intake/Output from previous day: 03/19 0701 - 03/20 0700 In: 1753.3 [P.O.:300; I.V.:1443.3] Out: 35 [Drains:35] Intake/Output this shift:    PE:  VSS; low grade temp WBC down cx still pending Output 35 cc 3/19: creamy yellow  Lab Results:   Basename 12/01/11 0622 11/30/11 0500  WBC 10.3 16.0*  HGB 9.5* 10.1*  HCT 29.1* 31.2*  PLT 437* 528*   BMET  Basename 11/30/11 0500  NA 135  K 4.2  CL 103  CO2 20  GLUCOSE 95  BUN 5*  CREATININE 0.53  CALCIUM 8.3*   PT/INR No results found for this basename: LABPROT:2,INR:2 in the last 72 hours ABG No results found for this basename: PHART:2,PCO2:2,PO2:2,HCO3:2 in the last 72 hours  Studies/Results: No results found.  Anti-infectives: Anti-infectives     Start     Dose/Rate Route Frequency Ordered Stop   11/29/11 0400   ertapenem (INVANZ) 1 g in sodium chloride 0.9 % 50 mL IVPB        1 g 100 mL/hr over 30 Minutes Intravenous Every 24 hours 11/28/11 0307     11/28/11 0330   ertapenem (INVANZ) 1 g in sodium chloride 0.9 % 50 mL IVPB        1 g 100 mL/hr over 30 Minutes Intra[facility]ous To [facility] 11/28/11 0319 11/28/11 0532   11/28/11 0230   piperacillin-tazobactam (ZOSYN) IVPB 3.375 g        3.375 g 12.5 mL/hr over 240 Minutes Intravenous  Once 11/28/11 0219 11/28/11 0633          Assessment/Plan: s/p LLQ drain placed 3/17 Output slowing, but significant Will need re CT soon Final cx still pending   Alleene Stoy A 12/01/2011

## 2011-12-01 NOTE — Progress Notes (Signed)
Patient ID: Amanda [name], female   DOB: 1961/03/15, 51 y.o.   MRN: [id number]    Subjective: Pt feels better today, appears more comfortable.  Having less pain.  No nausea.  Objective: Vital signs in last 24 hours: Temp:  [98.8 F (37.1 C)-99.4 F (37.4 C)] 99.4 F (37.4 C) (03/20 0538) Pulse Rate:  [107-116] 107  (03/20 0538) Resp:  [16-20] 16  (03/20 0538) BP: (117-135)/(62-80) 119/70 mmHg (03/20 0538) SpO2:  [93 %-99 %] 93 % (03/20 0538) Last BM Date: 11/30/11  Intake/Output from previous day: 03/19 0701 - 03/20 0700 In: 1753.3 [P.O.:300; I.V.:1443.3] Out: 35 [Drains:35] Intake/Output this shift:    PE: Abd: soft, less tender, but still somewhat tender, +BS, ND, JP still with cloudy milky output.  Lab Results:   Basename 12/01/11 0622 11/30/11 0500  WBC 10.3 16.0*  HGB 9.5* 10.1*  HCT 29.1* 31.2*  PLT 437* 528*   BMET  Basename 11/30/11 0500  NA 135  K 4.2  CL 103  CO2 20  GLUCOSE 95  BUN 5*  CREATININE 0.53  CALCIUM 8.3*   PT/INR No results found for this basename: LABPROT:2,INR:2 in the last 72 hours CMP     Component Value Date/Time   NA 135 11/30/2011 0500   K 4.2 11/30/2011 0500   CL 103 11/30/2011 0500   CO2 20 11/30/2011 0500   GLUCOSE 95 11/30/2011 0500   BUN 5* 11/30/2011 0500   CREATININE 0.53 11/30/2011 0500   CALCIUM 8.3* 11/30/2011 0500   PROT 7.4 11/28/2011 0104   ALBUMIN 2.8* 11/28/2011 0104   AST 18 11/28/2011 0104   ALT 15 11/28/2011 0104   ALKPHOS 114 11/28/2011 0104   BILITOT 0.3 11/28/2011 0104   GFRNONAA >90 11/30/2011 0500   GFRAA >90 11/30/2011 0500   Lipase  No results found for this basename: lipase       Studies/Results: No results found.  Anti-infectives: Anti-infectives     Start     Dose/Rate Route Frequency Ordered Stop   11/29/11 0400   ertapenem (INVANZ) 1 g in sodium chloride 0.9 % 50 mL IVPB        1 g 100 mL/hr over 30 Minutes Intravenous Every 24 hours 11/28/11 0307     11/28/11 0330   ertapenem (INVANZ) 1  g in sodium chloride 0.9 % 50 mL IVPB        1 g 100 mL/hr over 30 Minutes Intra[facility]ous [facility] 11/28/11 0319 11/28/11 0532   11/28/11 0230  piperacillin-tazobactam (ZOSYN) IVPB 3.375 g       3.375 g 12.5 mL/hr over 240 Minutes Intravenous  Once 11/28/11 0219 11/28/11 0633           Assessment/Plan  1. Diverticulitis with perforation and abscess, s/p perc drain  Plan: 1. Will try clear liquids today 2. Anticipate repeat CT scan on Friday 3. Add Mucinex for cough.   LOS: 3 days    Tanyia Grabbe E 12/01/2011

## 2011-12-01 NOTE — Progress Notes (Signed)
I have seen and examined the patient and agree with the assessment and plans.  Cassara Nida A. Kazimir Hartnett  MD, FACS  

## 2011-12-02 ENCOUNTER — Inpatient Hospital Stay (HOSPITAL_COMMUNITY): Payer: 59

## 2011-12-02 LAB — CBC
Hemoglobin: 9.5 g/dL — ABNORMAL LOW (ref 12.0–15.0)
MCH: 28.7 pg (ref 26.0–34.0)
MCHC: 32.9 g/dL (ref 30.0–36.0)
Platelets: 460 10*3/uL — ABNORMAL HIGH (ref 150–400)
RDW: 14.8 % (ref 11.5–15.5)

## 2011-12-02 NOTE — Progress Notes (Signed)
  Subjective: Doing well today. Tolerating clears so far - for advancement to full liquids today.   Objective: Vital signs in last 24 hours: Temp:  [98.7 F (37.1 C)] 98.7 F (37.1 C) (03/21 0552) Pulse Rate:  [88-105] 88  (03/21 0552) Resp:  [18] 18  (03/21 0552) BP: (127-138)/(73-83) 127/73 mmHg (03/21 0552) SpO2:  [93 %-94 %] 93 % (03/21 0552) Last BM Date: 12/01/11  Intake/Output from previous day: 03/20 0701 - 03/21 0700 In: 3403.7 [P.O.:997; I.V.:2401.7] Out: 60 [Drains:60] Intake/Output this shift: Total I/O In: 237 [Other:237] Out: -   Physical exam :  Patient up in chair, drain intact,  insertion site clean and dry. Milky blood tinged drainage in JP bulb.  Output 35 ml 3/19, 60 ml 3/20, appx. 15 ml in bulb upon exam.  Abdomen is soft, non-distended with positive bowel sounds. Cultures positive for aerophilic streptococci.   Lab Results:   [facility] 12/02/11 0640 12/01/11 0622  WBC 7.5 10.3  HGB 9.5* 9.5*  HCT 28.9* 29.1*  PLT 460* 437*   BMET  Basename 11/30/11 0500  NA 135  K 4.2  CL 103  CO2 20  GLUCOSE 95  BUN 5*  CREATININE 0.53  CALCIUM 8.3*     Anti-infectives: Anti-infectives     Start     Dose/Rate Route Frequency Ordered Stop   11/29/11 0400   ertapenem (INVANZ) 1 g in sodium chloride 0.9 % 50 mL IVPB        1 g 100 mL/hr over 30 Minutes Intravenous Every 24 hours 11/28/11 0307     11/28/11 0330   ertapenem (INVANZ) 1 g in sodium chloride 0.9 % 50 mL IVPB        1 g 100 mL/hr over 30 Minutes Intra[facility]ous [facility] 11/28/11 0319 11/28/11 0532   11/28/11 0230   piperacillin-tazobactam (ZOSYN) IVPB 3.375 g        3.375 g 12.5 mL/hr over 240 Minutes Intravenous  Once 11/28/11 0219 11/28/11 1610          Assessment/Plan:  Diverticular tic with abscess s/p percutaneous drainage placement 3/17.  Output remains - surgery has ordered f/u CT for tomorrow. Will check results and follow while IP. Continue flushes for now.   LOS: 4 days    CAMPBELL,PAMELA D 12/02/2011

## 2011-12-02 NOTE — Progress Notes (Signed)
Patient ID: Amanda [name], female   DOB: 1961/08/14, 51 y.o.   MRN: [id number]    Subjective: Pt feeling much better.  Still has cough, but better with Mucinex.  Abdominal pain improving, tolerating clears  Objective: Vital signs in last 24 hours: Temp:  [98.7 F (37.1 C)] 98.7 F (37.1 C) (03/21 0552) Pulse Rate:  [88-105] 88  (03/21 0552) Resp:  [18] 18  (03/21 0552) BP: (127-138)/(73-83) 127/73 mmHg (03/21 0552) SpO2:  [93 %-94 %] 93 % (03/21 0552) Last BM Date: 12/01/11  Intake/Output from previous day: 03/20 0701 - 03/21 0700 In: 3403.7 [P.O.:997; I.V.:2401.7] Out: 60 [Drains:60] Intake/Output this shift: Total I/O In: 237 [Other:237] Out: -   PE: Abd: soft, less tender, +BS, ND, drain with pink cloudy, milky output. Heart: regular Lungs: bilateral exp wheeze, with exp rhonchi on right as well.  Lab Results:   [facility] 12/02/11 0640 12/01/11 0622  WBC 7.5 10.3  HGB 9.5* 9.5*  HCT 28.9* 29.1*  PLT 460* 437*   BMET  Basename 11/30/11 0500  NA 135  K 4.2  CL 103  CO2 20  GLUCOSE 95  BUN 5*  CREATININE 0.53  CALCIUM 8.3*   PT/INR No results found for this basename: LABPROT:2,INR:2 in the last 72 hours CMP     Component Value Date/Time   NA 135 11/30/2011 0500   K 4.2 11/30/2011 0500   CL 103 11/30/2011 0500   CO2 20 11/30/2011 0500   GLUCOSE 95 11/30/2011 0500   BUN 5* 11/30/2011 0500   CREATININE 0.53 11/30/2011 0500   CALCIUM 8.3* 11/30/2011 0500   PROT 7.4 11/28/2011 0104   ALBUMIN 2.8* 11/28/2011 0104   AST 18 11/28/2011 0104   ALT 15 11/28/2011 0104   ALKPHOS 114 11/28/2011 0104   BILITOT 0.3 11/28/2011 0104   GFRNONAA >90 11/30/2011 0500   GFRAA >90 11/30/2011 0500   Lipase  No results found for this basename: lipase       Studies/Results: No results found.  Anti-infectives: Anti-infectives     Start     Dose/Rate Route Frequency Ordered Stop   11/29/11 0400   ertapenem (INVANZ) 1 g in sodium chloride 0.9 % 50 mL IVPB        1 g 100  mL/hr over 30 Minutes Intravenous Every 24 hours 11/28/11 0307     11/28/11 0330   ertapenem (INVANZ) 1 g in sodium chloride 0.9 % 50 mL IVPB        1 g 100 mL/hr over 30 Minutes Intravenous [facility] 11/28/11 0319 11/28/11 0532   11/28/11 0230  piperacillin-tazobactam (ZOSYN) IVPB 3.375 g       3.375 g 12.5 mL/hr over 240 Minutes Intravenous  Once 11/28/11 0219 11/28/11 0633           Assessment/Plan  1. Tics with perf and drain 2. Cough with wheeze  Plan: 1. Cont mucinex for now.  Will get a CXR today to r/o PNA 2. CT scan tomorrow to re-eval tics and abscess 3. Advance to full liquids today.   LOS: 4 days    Amanda [name] E 12/02/2011

## 2011-12-02 NOTE — Progress Notes (Signed)
I have seen and examined the patient and agree with the assessment and plans.  Teyonna Plaisted A. Mikaili Flippin  MD, FACS  

## 2011-12-02 NOTE — Progress Notes (Signed)
UR of chart complete.  

## 2011-12-03 ENCOUNTER — Inpatient Hospital Stay (HOSPITAL_COMMUNITY): Payer: 59

## 2011-12-03 MED ORDER — CIPROFLOXACIN HCL 500 MG PO TABS: 500.0000 mg | ORAL_TABLET | Freq: Two times a day (BID) | ORAL | Status: AC

## 2011-12-03 MED ORDER — METRONIDAZOLE 500 MG PO TABS: 500.0000 mg | ORAL_TABLET | Freq: Three times a day (TID) | ORAL | Status: AC

## 2011-12-03 MED ORDER — IOHEXOL 300 MG/ML  SOLN
80.0000 mL | Freq: Once | INTRAMUSCULAR | Status: AC | PRN
  Administered 2011-12-03: 80 mL via INTRAVENOUS

## 2011-12-03 MED ORDER — IOHEXOL 300 MG/ML  SOLN
20.0000 mL | INTRAMUSCULAR | Status: AC
  Administered 2011-12-03 (×2): 20 mL via ORAL

## 2011-12-03 MED ORDER — OXYCODONE-ACETAMINOPHEN 5-325 MG PO TABS
1.0000 | ORAL_TABLET | ORAL | Status: DC | PRN
  Administered 2011-12-03 – 2011-12-05 (×8): 2 via ORAL
  Filled 2011-12-03 (×8): qty 2

## 2011-12-03 MED ORDER — OXYCODONE-ACETAMINOPHEN 5-325 MG PO TABS: 1.0000 | ORAL_TABLET | ORAL | Status: AC | PRN

## 2011-12-03 MED ORDER — PANTOPRAZOLE SODIUM 40 MG PO TBEC
40.0000 mg | DELAYED_RELEASE_TABLET | Freq: Every day | ORAL | Status: DC
  Administered 2011-12-03 – 2011-12-05 (×3): 40 mg via ORAL
  Filled 2011-12-03 (×3): qty 1

## 2011-12-03 NOTE — Discharge Summary (Signed)
Patient ID: ADRIEANA FENNELLY MRN: [id number] DOB/AGE: November 07, 1960 51 y.o.  Admit date: 11/28/2011 Discharge date: 12/03/2011  Procedures: percutaneous placement of abdominal drain  Consults: None  Reason for Admission: This is a 51 yo female who presented to [facility] after a 1 week history of abdominal pain and fullness with diarrhea.  She had a CT scan that revealed perforated diverticulitis with perforation and abscess.  See admitting H&P for further details.  Admission Diagnoses:  1. Diverticulitis with microperforation and abscess 2. CAD  Hospital Course:  The patient was admitted and started on IV Invanz.  Due to the large abscess she had, IR was consulted and a percutaneous drain was placed.  The patient took several days before he pain began to feel much better after her drain placement.  The drain put out milky, cloudy output during her entire stay.  Finally on HD# 4, her pain began to improve enough that we were able to give her clear liquids.  Her diet was then able to be advanced as tolerated.  She had a repeat CT scan on 12-03-11. This showed a couple smaller abscesses.  She is now pain free, nl wbc, no fever with drain in place.  She is tolerating a regular diet and has return of bowel function.  She would like to go home and will plan on doing that with po abx today.    She also had some c/o cough during this admission.  She was given mucinex which helped.  She had a CXR that was negative.  Discharge Diagnoses:  Active Problems:  Intestinal diverticular abscess s/p perc drain Cough/URI  Discharge Medications: Medication List  As of 12/03/2011 10:08 AM   TAKE these medications         ciprofloxacin 500 MG tablet   Commonly known as: CIPRO   Take 1 tablet (500 mg total) by mouth 2 (two) times daily.      glucosamine-chondroitin 500-400 MG tablet   Take 1 tablet by mouth daily.      metoprolol succinate 25 MG 24 hr tablet   Commonly known as: TOPROL-XL   Take 25 mg by mouth  every evening.      metroNIDAZOLE 500 MG tablet   Commonly known as: FLAGYL   Take 1 tablet (500 mg total) by mouth 3 (three) times daily.      multivitamin tablet   Take 1 tablet by mouth daily.      oxyCODONE-acetaminophen 5-325 MG per tablet   Commonly known as: PERCOCET   Take 1-2 tablets by mouth every 4 (four) hours as needed.      VIMOVO 500-20 MG Tbec   Generic drug: Naproxen-Esomeprazole   Take 500 mg by mouth 2 (two) times daily.            Discharge Instructions: See AVS F/u with Dr. Magnus Ivan in one week   Signed: OSBORNE,KELLY E 12/03/2011, 10:08 AM

## 2011-12-03 NOTE — Progress Notes (Signed)
12/03/11 14:05 MD order received for Home health RN to assist with JP drain management. In to see patient who confirmed she and her husband would be able to learn JP drain management until [facility] can start services on Sunday March 24,2013.  Discussed with RN caring for patient she will provide enough flushes for until start of care on Sunday. Referral called to [facility] with [facility]. Jim Like RN BSN CCM.

## 2011-12-03 NOTE — Progress Notes (Signed)
Patient ID: Amanda [name], female   DOB: [birth year], 51 y.o.   MRN: [id number]    Subjective: Pt continues to feel better.  Tolerating full liquids well.  No increase in pain.  Objective: Vital signs in last 24 hours: Temp:  [97.9 F (36.6 C)-98.4 F (36.9 C)] 97.9 F (36.6 C) (03/21 2225) Pulse Rate:  [97-98] 98  (03/21 2225) Resp:  [18] 18  (03/21 2225) BP: (130-139)/(91-92) 139/92 mmHg (03/21 2225) SpO2:  [92 %-97 %] 97 % (03/21 2225) Last BM Date: 12/02/11  Intake/Output from previous day: 03/21 0701 - 03/22 0700 In: 3335.3 [P.O.:1030; I.V.:1998.3; IV Piggyback:60] Out: 10 [Drains:10] Intake/Output this shift:    PE: Abd: soft, less tender, JP still with very cloudy, milky output, +BS, ND Heart: regular Lungs: still with slight expiratory wheezes  Lab Results:   Basename 12/02/11 0640 12/01/11 0622  WBC 7.5 10.3  HGB 9.5* 9.5*  HCT 28.9* 29.1*  PLT 460* 437*   BMET No results found for this basename: NA:2,K:2,CL:2,CO2:2,GLUCOSE:2,BUN:2,CREATININE:2,CALCIUM:2 in the last 72 hours PT/INR No results found for this basename: LABPROT:2,INR:2 in the last 72 hours CMP     Component Value Date/Time   NA 135 11/30/2011 0500   K 4.2 11/30/2011 0500   CL 103 11/30/2011 0500   CO2 20 11/30/2011 0500   GLUCOSE 95 11/30/2011 0500   BUN 5* 11/30/2011 0500   CREATININE 0.53 11/30/2011 0500   CALCIUM 8.3* 11/30/2011 0500   PROT 7.4 11/28/2011 0104   ALBUMIN 2.8* 11/28/2011 0104   AST 18 11/28/2011 0104   ALT 15 11/28/2011 0104   ALKPHOS 114 11/28/2011 0104   BILITOT 0.3 11/28/2011 0104   GFRNONAA >90 11/30/2011 0500   GFRAA >90 11/30/2011 0500   Lipase  No results found for this basename: lipase       Studies/Results: Dg Chest 2 View  12/02/2011  *RADIOLOGY REPORT*  Clinical Data: Cough and wheezing  CHEST - 2 VIEW  Comparison: None.  Findings: Cardiomediastinal silhouette is stable.  No acute infiltrate or pulmonary edema.  Question trace left pleural effusion with left  posterior basilar atelectasis. Bony thorax is unremarkable.  IMPRESSION: No acute infiltrate or pulmonary edema.  Question trace left pleural effusion with left posterior basilar atelectasis.  Original Report Authenticated By: Natasha Mead, M.D.    Anti-infectives: Anti-infectives     Start     Dose/Rate Route Frequency Ordered Stop   11/29/11 0400   ertapenem (INVANZ) 1 g in sodium chloride 0.9 % 50 mL IVPB        1 g 100 mL/hr over 30 Minutes Intravenous Every 24 hours 11/28/11 0307     11/28/11 0330   ertapenem (INVANZ) 1 g in sodium chloride 0.9 % 50 mL IVPB        1 g 100 mL/hr over 30 Minutes Intravenous To [facility] 11/28/11 0319 11/28/11 0532   11/28/11 0230  piperacillin-tazobactam (ZOSYN) IVPB 3.375 g       3.375 g 12.5 mL/hr over 240 Minutes Intravenous  Once 11/28/11 0219 11/28/11 0633           Assessment/Plan  1. Perforated diverticulitis with abscess, s/p perc drain 2. URI  Plan: 1. Will get repeat scan today, if stable then suspect patient may be able to discharge later this after. 2. Advance to solid diet.   LOS: 5 days    Makyiah Lie E 12/03/2011

## 2011-12-03 NOTE — Discharge Instructions (Signed)
Measure drain output and bring with you to follow up appointment.  Diverticulitis A diverticulum is a small pouch or sac on the colon. Diverticulosis is the presence of these diverticula on the colon. Diverticulitis is the irritation (inflammation) or infection of diverticula. CAUSES  The colon and its diverticula contain bacteria. If food particles block the tiny opening to a diverticulum, the bacteria inside can grow and cause an increase in pressure. This leads to infection and inflammation and is called diverticulitis. SYMPTOMS   Abdominal pain and tenderness. Usually, the pain is located on the left side of your abdomen. However, it could be located elsewhere.   Fever.   Bloating.   Feeling sick to your stomach (nausea).   Throwing up (vomiting).   Abnormal stools.  DIAGNOSIS  Your caregiver will take a history and perform a physical exam. Since many things can cause abdominal pain, other tests may be necessary. Tests may include:  Blood tests.   Urine tests.   X-ray of the abdomen.   CT scan of the abdomen.  Sometimes, surgery is needed to determine if diverticulitis or other conditions are causing your symptoms. TREATMENT  Most of the time, you can be treated without surgery. Treatment includes:  Resting the bowels by only having liquids for a few days. As you improve, you will need to eat a low-fiber diet.   Intravenous (IV) fluids if you are losing body fluids (dehydrated).   Antibiotic medicines that treat infections may be given.   Pain and nausea medicine, if needed.   Surgery if the inflamed diverticulum has burst.  HOME CARE INSTRUCTIONS   Try a clear liquid diet (broth, tea, or water for as long as directed by your caregiver). You may then gradually begin a low-fiber diet as tolerated. A low-fiber diet is a diet with less than 10 grams of fiber. Choose the foods below to reduce fiber in the diet:   White breads, cereals, rice, and pasta.   Cooked fruits  and vegetables or soft fresh fruits and vegetables without the skin.   Ground or well-cooked tender beef, ham, veal, lamb, pork, or poultry.   Eggs and seafood.   After your diverticulitis symptoms have improved, your caregiver may put you on a high-fiber diet. A high-fiber diet includes 14 grams of fiber for every 1000 calories consumed. For a standard 2000 calorie diet, you would need 28 grams of fiber. Follow these diet guidelines to help you increase the fiber in your diet. It is important to slowly increase the amount fiber in your diet to avoid gas, constipation, and bloating.   Choose whole-grain breads, cereals, pasta, and brown rice.   Choose fresh fruits and vegetables with the skin on. Do not overcook vegetables because the more vegetables are cooked, the more fiber is lost.   Choose more nuts, seeds, legumes, dried peas, beans, and lentils.   Look for food products that have greater than 3 grams of fiber per serving on the Nutrition Facts label.   Take all medicine as directed by your caregiver.   If your caregiver has given you a follow-up appointment, it is very important that you go. Not going could result in lasting (chronic) or permanent injury, pain, and disability. If there is any problem keeping the appointment, call to reschedule.  SEEK MEDICAL CARE IF:   Your pain does not improve.   You have a hard time advancing your diet beyond clear liquids.   Your bowel movements do not return to normal.  SEEK IMMEDIATE MEDICAL CARE IF:   Your pain becomes worse.   You have an oral temperature above 102 F (38.9 C), not controlled by medicine.   You have repeated vomiting.   You have bloody or black, tarry stools.   Symptoms that brought you to your caregiver become worse or are not getting better.  MAKE SURE YOU:   Understand these instructions.   Will watch your condition.   Will get help right away if you are not doing well or get worse.  Document Released:  06/09/2005 Document Revised: 08/19/2011 Document Reviewed: 10/05/2010 [facility] Patient Information 2012 [place], [place].  Bulb Drain Home Care A bulb drain is a small, plastic reservoir which creates a gentle suction. It is used to remove excess fluid from a surgical wound. The color and amount of fluid will vary. Immediately after surgery, the fluid is bright red. It may gradually change to a yellow color. When the amount decreases to about 1 or 2 tablespoons (15 to 30 cc) per 24 hours, your caregiver will usually remove it. DAILY CARE  Keep the bulb compressed at all times, except while emptying it. The compression creates suction.   Keep sites where the tubes enter the skin dry and covered with a light bandage (dressing).   Tape the tubes to your skin, 1 to 2 inches below the insertion sites, to keep from pulling on your stitches. Tubes are stitched in place and will not slip out.   Pin the bulb to your shirt (not to your pants) with a safety pin.   For the first few days after surgery, there usually is more fluid in the bulb. Empty the bulb whenever it becomes half full because the bulb does not create enough suction if it is too full. Include this amount in your 24 hour totals.   When the amount of drainage decreases, empty the bulb at the same time every day. Write down the amounts and the 24 hour totals. Your caregiver will want to know them. This helps your caregiver know when the tubes can be removed.   Once you are home, it is no longer necessary to strip the tubes as may have been done in the hospital. If there is drainage around the tube sites, change dressings and keep the area dry. If you see a clot in the tube, leave it alone. However, if the tube does not appear to be draining, let your caregiver know.  TO EMPTY THE BULB  Open the stopper to release suction.   Holding the stopper out of the way, pour drainage into the measuring cup that was sent home with you.   Measure and  write down the amount. If there are 2 bulbs, note the amount of drainage from bulb 1 or bulb 2 and keep the totals separate. Your caregiver will want to know which tube is draining more.   Compress the bulb by folding it in half.   Replace the stopper.   Check the tape that holds the tube to your skin, and pin the bulb to your shirt.  SEEK MEDICAL CARE IF:  The drainage develops a bad odor.   You have an oral temperature above 102 F (38.9 C).   The amount of drainage from your wound suddenly increases or decreases.   You accidentally pull out your drain.   You have any other questions or concerns.  MAKE SURE YOU:   Understand these instructions.   Will watch your condition.   Will  get help right away if you are not doing well or get worse.  Document Released: 08/27/2000 Document Revised: 08/19/2011 Document Reviewed: 08/30/2005 [facility] Patient Information 2012 [place], [place].

## 2011-12-03 NOTE — Progress Notes (Signed)
I have seen and examined the patient and agree with the assessment and plans.  Brewer Hitchman A. Jouri Threat  MD, FACS  

## 2011-12-04 LAB — DIFFERENTIAL
Basophils Absolute: 0 10*3/uL (ref 0.0–0.1)
Lymphs Abs: 1.8 10*3/uL (ref 0.7–4.0)
Monocytes Absolute: 0.7 10*3/uL (ref 0.1–1.0)

## 2011-12-04 LAB — CBC
MCH: 28.7 pg (ref 26.0–34.0)
MCHC: 32.8 g/dL (ref 30.0–36.0)
MCV: 87.3 fL (ref 78.0–100.0)
Platelets: 547 10*3/uL — ABNORMAL HIGH (ref 150–400)
RDW: 14.9 % (ref 11.5–15.5)

## 2011-12-04 NOTE — Progress Notes (Signed)
Subjective: Patient feeling better, no new c/o  Objective: Vital signs in last 24 hours: Temp:  [98.1 F (36.7 C)-98.3 F (36.8 C)] 98.2 F (36.8 C) (03/23 0719) Pulse Rate:  [94-101] 94  (03/23 0719) Resp:  [18] 18  (03/23 0719) BP: (122-140)/(75-90) 133/75 mmHg (03/23 0719) SpO2:  [95 %-98 %] 98 % (03/23 0719) Last BM Date: 12/03/11  Intake/Output from previous day: 03/22 0701 - 03/23 0700 In: 3348.3 [P.O.:840; I.V.:2448.3; IV Piggyback:50] Out: 29 [Urine:2; Drains:25; Stool:2] Intake/Output this shift:    Left pelvic drain intact, output about 25 cc's today, insertion site ok, mildly tender, cx's -strept  Lab Results:   Basename 12/04/11 1051 12/02/11 0640  WBC 7.7 7.5  HGB 11.1* 9.5*  HCT 33.8* 28.9*  PLT 547* 460*   BMET No results found for this basename: NA:2,K:2,CL:2,CO2:2,GLUCOSE:2,BUN:2,CREATININE:2,CALCIUM:2 in the last 72 hours PT/INR No results found for this basename: LABPROT:2,INR:2 in the last 72 hours ABG No results found for this basename: PHART:2,PCO2:2,PO2:2,HCO3:2 in the last 72 hours Results for orders placed during the hospital encounter of 11/28/11  CULTURE, ROUTINE-ABSCESS     Status: Normal   Collection Time   11/28/11  1:20 PM      Component Value Range Status Comment   Specimen Description ABSCESS ABDOMEN   Final    Special Requests ASPIRATE   Final    Gram Stain     Final    Value: ABUNDANT WBC PRESENT,BOTH PMN AND MONONUCLEAR     NO SQUAMOUS EPITHELIAL CELLS SEEN     ABUNDANT GRAM POSITIVE COCCI IN PAIRS AND CHAINS   Culture     Final    Value: ABUNDANT MICROAEROPHILIC STREPTOCOCCI     Note: Standardized susceptibility testing for this organism is not available.   Report Status 12/01/2011 FINAL   Final     Studies/Results: Ct Abdomen Pelvis W Contrast  12/03/2011  *RADIOLOGY REPORT*  Clinical Data: Follow up diverticular abscess.  CT ABDOMEN AND PELVIS WITH CONTRAST 12/03/2011:  Technique:  Multidetector CT imaging of the  abdomen and pelvis was performed following the standard protocol during bolus administration of intravenous contrast.  Contrast:  80 ml Omnipaque-300 IV.  Oral contrast also administered.  Comparison: CT abdomen and pelvis 11/27/2011 and images obtained at the time of abscess drainage on 11/28/2011.  Findings: Interval decrease in size of the multiloculated abscess in the left side of the low pelvis, current maximum measurements approximating 3.7 x 4.9 x 4.6 cm (series 2, image 67 and coronal image 51).  Percutaneous drainage catheter in place within the largest loculation.  Persistent wall thickening and associated pericolonic edema/inflammation involving the proximal and mid sigmoid colon in an area of diverticulosis.  Diverticulosis involving the descending colon as well.  Remainder of the colon decompressed and unremarkable.  Small bowel normal in appearance. Stomach decompressed and unremarkable.  No free intraperitoneal air.  2 new fluid collections in the pelvis, one in the right side of the low pelvis adjacent to the lower uterus measuring approximately 4.3 x 2.4 x 5.3 cm (series 2, image 69 and coronal image 78) and the other in the midline of the low pelvis, situated between the lower uterine segment and the rectum, measuring approximately 2.8 x 3.9 x 3.8 cm (series 2, image 74 and coronal image 89).  Sub-centimeter low attenuation lesion in the posterior segment right lobe of liver (series 2, image 29), unchanged from the prior examination, statistically a small cyst, given its conspicuity for small size.  No significant abnormalities  involving the liver. Normal appearing spleen, pancreas, and adrenal glands.  Extrarenal pelvis involving the left kidney and incomplete rotation of the right kidney; no significant abnormalities involving either kidney. Gallbladder unremarkable by CT.  No biliary ductal dilation.  No visible aorto-iliofemoral atherosclerosis.  Scattered upper normal- sized lymph nodes in the  retroperitoneum and at the root of the mesentery, unchanged.  Urinary bladder decompressed and unremarkable.  Uterus unremarkable by CT.  Phleboliths low in the left side of the pelvis.  Bone window images demonstrate degenerative changes in the facets at L3- 4 and mild lower thoracic spondylosis.  Small bilateral pleural effusions, right greater than left, and associated mild passive atelectasis in the lower lobes.  IMPRESSION:  1.  2 new abscesses in the pelvis.  One is in the right low pelvis adjacent to the uterine body, and the other is in the midline of the low pelvis between the lower uterine segment and the rectum. Measurements are given above. 2.  Interval decrease in size of the multiloculated diverticular abscess in the left anterior pelvis, measurements given above.  The abscess drainage catheter is within the largest loculation. 3.  Small bilateral pleural effusions, right greater than left, and associated mild passive atelectasis in the lower lobes. 4.  Stable shotty retroperitoneal and mesenteric lymphadenopathy, likely reactive.  Original Report Authenticated By: Arnell Sieving, M.D.    Anti-infectives: Anti-infectives     Start     Dose/Rate Route Frequency Ordered Stop   12/03/11 0000   ciprofloxacin (CIPRO) 500 MG tablet        500 mg Oral 2 times daily 12/03/11 1003 12/17/11 2359   12/03/11 0000   metroNIDAZOLE (FLAGYL) 500 MG tablet        500 mg Oral 3 times daily 12/03/11 1003 12/17/11 2359   11/29/11 0400   ertapenem (INVANZ) 1 g in sodium chloride 0.9 % 50 mL IVPB        1 g 100 mL/hr over 30 Minutes Intravenous Every 24 hours 11/28/11 0307     11/28/11 0330   ertapenem (INVANZ) 1 g in sodium chloride 0.9 % 50 mL IVPB        1 g 100 mL/hr over 30 Minutes Intravenous To [facility] 11/28/11 0319 11/28/11 0532   11/28/11 0230   piperacillin-tazobactam (ZOSYN) IVPB 3.375 g        3.375 g 12.5 mL/hr over 240 Minutes Intravenous  Once 11/28/11 0219 11/28/11  1610          Assessment/Plan: s/p *pelvic/diverticular abscess drainage 3/17; f/u CT shows smaller but unresolved drained collection with two additional but small and nonaccessible collections; continue current tx; will need drain injection before removal to r/o fistula; other plans as per CCS.   LOS: 6 days    Rosibel Giacobbe,D [facility] 12/04/2011

## 2011-12-04 NOTE — Progress Notes (Addendum)
Intestinal diverticular abscess   Subjective: She feels ok, much better than at admission, tolerating diet, minimal pain, ambulating and had BM  Objective: Vital signs in last 24 hours: Temp:  [98.1 F (36.7 C)-98.3 F (36.8 C)] 98.2 F (36.8 C) (03/23 0719) Pulse Rate:  [94-101] 94  (03/23 0719) Resp:  [18] 18  (03/23 0719) BP: (122-140)/(75-90) 133/75 mmHg (03/23 0719) SpO2:  [95 %-98 %] 98 % (03/23 0719) Last BM Date: 12/03/11  Intake/Output from previous day: 03/22 0701 - 03/23 0700 In: 3348.3 [P.O.:840; I.V.:2448.3; IV Piggyback:50] Out: 29 [Urine:2; Drains:25; Stool:2] Intake/Output this shift:    General appearance: alert, cooperative and no distress Resp: clear to auscultation bilaterally GI: soft, non-tender; bowel sounds normal; no masses,  no organomegaly  Lab Results:  No results found for this or any previous visit (from the past 24 hour(s)).   Studies/Results Radiology     MEDS, Scheduled    . antiseptic oral rinse  15 mL Mouth Rinse q12n4p  . chlorhexidine  15 mL Mouth Rinse BID  . dextromethorphan-guaiFENesin  1 tablet Oral BID  . enoxaparin  40 mg Subcutaneous Q24H  . ertapenem (INVANZ) IV  1 g Intravenous Q24H  . feeding supplement  1 Container Oral TID WC  . metoprolol succinate  25 mg Oral QPM  . pantoprazole  40 mg Oral Q1200  . DISCONTD: pantoprazole (PROTONIX) IV  40 mg Intravenous QHS     Assessment: Acute Diverticulitis with abscess, s/p perc drain, now with new collections noted on CT. However, clinically improved and basically asymtomatic  Plan: Will check CBC today and review CT with radiologist. May be that she could go home for a long course of antibiotics. Alternatively, she may need sigmoid colectomy and might be able to have a bowel prep and primary anastomosis. All will depend on ongoing clinical course  Reviewed CT with radiologist, Sharin Mons. The two new collections are in the pelvis, relatively small, lilkely hard to  perc drain. The original abscess clearly smaller, and drain is in main portion, but it has loculations and may be slow to resolve,   LOS: 6 days    Currie Paris, MD, [facility], [place] [phone]   12/04/2011 10:37 AM

## 2011-12-05 MED ORDER — METRONIDAZOLE 500 MG PO TABS: 500.0000 mg | ORAL_TABLET | Freq: Three times a day (TID) | ORAL | Status: DC

## 2011-12-05 MED ORDER — METRONIDAZOLE 500 MG PO TABS
500.0000 mg | ORAL_TABLET | Freq: Three times a day (TID) | ORAL | Status: DC
  Administered 2011-12-05: 500 mg via ORAL
  Filled 2011-12-05 (×3): qty 1

## 2011-12-05 MED ORDER — CIPROFLOXACIN HCL 500 MG PO TABS
500.0000 mg | ORAL_TABLET | Freq: Two times a day (BID) | ORAL | Status: DC
Start: 2011-12-05 — End: 2011-12-05
  Administered 2011-12-05: 500 mg via ORAL
  Filled 2011-12-05 (×2): qty 1

## 2011-12-05 MED ORDER — CIPROFLOXACIN HCL 500 MG PO TABS: 500.0000 mg | ORAL_TABLET | Freq: Two times a day (BID) | ORAL | Status: DC

## 2011-12-05 NOTE — Progress Notes (Signed)
Spoke with Dr. Quincy Carnes in Radiology about home care and JP abscess drain.  Dr. Lyman Bishop stated to have pt continue to flush the drain with 10 cc normal saline every 8 hrs (or 3 times a day) once at home.  I will instruct pt how to do this and inform her what to call MD for (if any complications with the drain).

## 2011-12-05 NOTE — Progress Notes (Signed)
Pt discharged to home accomp. by husband.  Called [facility] to notify them that the pt was leaving today and they will initiate home visits on Monday, 3/25, pt informed.   Supplies given to pt for 10 cc sterile normal saline TID flushes for the drain tube and record for drainage amount as per Dr. Lyman Bishop, Radiologist.  Pt given CCS # in case of any problems or concerns, she understands that she is to call on Monday to set up an appt with Dr. Magnus Ivan for 1 week.  Rx's given to pt for percocet, cipro and flagyl and explained.  Pt informed to call if drain comes out, or fever greater than 100.5, or for any other complications with the drain.  She understands how to empty the drain and how to flush the drain as explained.  No further questions about home self care.

## 2011-12-05 NOTE — Progress Notes (Signed)
Subjective: No complaints, tol regular diet, has bowel function, she wants to go home today  Objective: Vital signs in last 24 hours: Temp:  [97.6 F (36.4 C)-98.2 F (36.8 C)] 98.2 F (36.8 C) (03/24 0534) Pulse Rate:  [75-94] 75  (03/24 0534) Resp:  [18] 18  (03/24 0534) BP: (137-146)/(84-90) 146/89 mmHg (03/24 0534) SpO2:  [96 %-98 %] 96 % (03/24 0534) Last BM Date: 12/04/11  Intake/Output from previous day: 03/23 0701 - 03/24 0700 In: 3558.3 [P.O.:1080; I.V.:2473.3] Out: 32 [Drains:32] Intake/Output this shift:    General appearance: no distress GI: soft nontender nondistended bs present, drain with murky fluid present  Lab Results:   Basename 12/04/11 1051  WBC 7.7  HGB 11.1*  HCT 33.8*  PLT 547*    Studies/Results: Ct Abdomen Pelvis W Contrast  12/03/2011  *RADIOLOGY REPORT*  Clinical Data: Follow up diverticular abscess.  CT ABDOMEN AND PELVIS WITH CONTRAST 12/03/2011:  Technique:  Multidetector CT imaging of the abdomen and pelvis was performed following the standard protocol during bolus administration of intravenous contrast.  Contrast:  80 ml Omnipaque-300 IV.  Oral contrast also administered.  Comparison: CT abdomen and pelvis 11/27/2011 and images obtained at the time of abscess drainage on 11/28/2011.  Findings: Interval decrease in size of the multiloculated abscess in the left side of the low pelvis, current maximum measurements approximating 3.7 x 4.9 x 4.6 cm (series 2, image 67 and coronal image 51).  Percutaneous drainage catheter in place within the largest loculation.  Persistent wall thickening and associated pericolonic edema/inflammation involving the proximal and mid sigmoid colon in an area of diverticulosis.  Diverticulosis involving the descending colon as well.  Remainder of the colon decompressed and unremarkable.  Small bowel normal in appearance. Stomach decompressed and unremarkable.  No free intraperitoneal air.  2 new fluid collections in  the pelvis, one in the right side of the low pelvis adjacent to the lower uterus measuring approximately 4.3 x 2.4 x 5.3 cm (series 2, image 69 and coronal image 78) and the other in the midline of the low pelvis, situated between the lower uterine segment and the rectum, measuring approximately 2.8 x 3.9 x 3.8 cm (series 2, image 74 and coronal image 89).  Sub-centimeter low attenuation lesion in the posterior segment right lobe of liver (series 2, image 29), unchanged from the prior examination, statistically a small cyst, given its conspicuity for small size.  No significant abnormalities involving the liver. Normal appearing spleen, pancreas, and adrenal glands.  Extrarenal pelvis involving the left kidney and incomplete rotation of the right kidney; no significant abnormalities involving either kidney. Gallbladder unremarkable by CT.  No biliary ductal dilation.  No visible aorto-iliofemoral atherosclerosis.  Scattered upper normal- sized lymph nodes in the retroperitoneum and at the root of the mesentery, unchanged.  Urinary bladder decompressed and unremarkable.  Uterus unremarkable by CT.  Phleboliths low in the left side of the pelvis.  Bone window images demonstrate degenerative changes in the facets at L3- 4 and mild lower thoracic spondylosis.  Small bilateral pleural effusions, right greater than left, and associated mild passive atelectasis in the lower lobes.  IMPRESSION:  1.  2 new abscesses in the pelvis.  One is in the right low pelvis adjacent to the uterine body, and the other is in the midline of the low pelvis between the lower uterine segment and the rectum. Measurements are given above. 2.  Interval decrease in size of the multiloculated diverticular abscess in the left anterior  pelvis, measurements given above.  The abscess drainage catheter is within the largest loculation. 3.  Small bilateral pleural effusions, right greater than left, and associated mild passive atelectasis in the lower  lobes. 4.  Stable shotty retroperitoneal and mesenteric lymphadenopathy, likely reactive.  Original Report Authenticated By: Arnell Sieving, M.D.      Assessment/Plan: Complicated diverticulitis - I had long talk with her today about possibilities.  She has two smaller collections that are not drainable and one larger multiloculated area with a drain in it. She is currently afebrile, wbc normal on invanz, eating fine with return of bowel function.  She does not need surgery currently.  She really wants to go home today although I told her I would rather she stay an additional 24 hours.  She is reliable though.  I will switch her to cipro/flagyl and hopefully she won't have trouble with that as I would rather do that then put picc in and send home on invanz.  If she fails this may need that though.  Will plan on seeing Dr. Magnus Ivan in a week and will need surgery for this but timing and how/what operation she will have will be based on recovery.  I told her to plan on being out of work for at least the next couple weeks and that this treatment may fail in which case she will need surgery.  I told her what to watch for as well to call back.   LOS: 7 days    [facility] 12/05/2011

## 2011-12-06 ENCOUNTER — Telehealth (INDEPENDENT_AMBULATORY_CARE_PROVIDER_SITE_OTHER): Payer: Self-pay | Admitting: General Surgery

## 2011-12-06 NOTE — Telephone Encounter (Signed)
Pt of Dr. Magnus Ivan calling for 1 week follow-up appt.  She was seen for intestinal abscess and is home with a drain. Please let her know when she should come in.

## 2011-12-07 ENCOUNTER — Other Ambulatory Visit: Payer: Self-pay | Admitting: Family Medicine

## 2011-12-14 ENCOUNTER — Encounter (INDEPENDENT_AMBULATORY_CARE_PROVIDER_SITE_OTHER): Payer: Self-pay | Admitting: Surgery

## 2011-12-14 ENCOUNTER — Ambulatory Visit (INDEPENDENT_AMBULATORY_CARE_PROVIDER_SITE_OTHER): Payer: Commercial Managed Care - PPO | Admitting: Surgery

## 2011-12-14 ENCOUNTER — Other Ambulatory Visit (INDEPENDENT_AMBULATORY_CARE_PROVIDER_SITE_OTHER): Payer: Self-pay | Admitting: Surgery

## 2011-12-14 VITALS — BP 118/70 | HR 96 | Temp 97.8°F | Resp 14 | Ht 66.0 in | Wt 182.0 lb

## 2011-12-14 DIAGNOSIS — K5732 Diverticulitis of large intestine without perforation or abscess without bleeding: Secondary | ICD-10-CM

## 2011-12-14 DIAGNOSIS — K5720 Diverticulitis of large intestine with perforation and abscess without bleeding: Secondary | ICD-10-CM

## 2011-12-14 DIAGNOSIS — L0291 Cutaneous abscess, unspecified: Secondary | ICD-10-CM

## 2011-12-14 NOTE — Progress Notes (Signed)
Subjective:     Patient ID: Amanda [name], female   DOB: 03-Aug-1961, 51 y.o.   MRN: [id number]  HPI She is here for a hospital visit status post admission for severe diverticulitis with abscess. During that admission she had a drain placed in one of the large abscess cavities. Her followup CAT scan before discharge showed new abscesses in the pelvis. She has since been discharged and is on oral antibiotics. She has been taking care of the drain herself. She denies any fevers. She is eating well and moving her bowels well.  Review of Systems     Objective:   Physical Exam On exam, she looks great. She is comfortable. Her abdomen is soft and totally nontender. There is no guarding. The drain has seropurulent fluid    Assessment:     Patient with diverticulitis with abscess    Plan:     I am going to repeat her CAT scan of the abdomen and pelvis to see if her drain can come out or to see if these collections in the pelvis or larger he may need further drained. She will continue her antibiotics. I renewed her Percocet.

## 2011-12-15 ENCOUNTER — Ambulatory Visit
Admission: RE | Admit: 2011-12-15 | Discharge: 2011-12-15 | Disposition: A | Discharge status: Home / Self Care | Payer: 59 | Source: Ambulatory Visit | Attending: Surgery | Admitting: Surgery

## 2011-12-15 DIAGNOSIS — L0291 Cutaneous abscess, unspecified: Secondary | ICD-10-CM

## 2011-12-15 MED ORDER — IOHEXOL 300 MG/ML  SOLN
100.0000 mL | Freq: Once | INTRAMUSCULAR | Status: AC | PRN
  Administered 2011-12-15: 100 mL via INTRAVENOUS

## 2011-12-17 ENCOUNTER — Ambulatory Visit (INDEPENDENT_AMBULATORY_CARE_PROVIDER_SITE_OTHER): Payer: Commercial Managed Care - PPO | Admitting: Surgery

## 2011-12-17 ENCOUNTER — Encounter (INDEPENDENT_AMBULATORY_CARE_PROVIDER_SITE_OTHER): Payer: Self-pay | Admitting: Surgery

## 2011-12-17 VITALS — BP 118/72 | HR 68 | Temp 97.8°F | Resp 18 | Ht 66.5 in | Wt 184.4 lb

## 2011-12-17 DIAGNOSIS — K5732 Diverticulitis of large intestine without perforation or abscess without bleeding: Secondary | ICD-10-CM

## 2011-12-17 DIAGNOSIS — K5792 Diverticulitis of intestine, part unspecified, without perforation or abscess without bleeding: Secondary | ICD-10-CM

## 2011-12-17 NOTE — Progress Notes (Signed)
Subjective:     Patient ID: Amanda [name], female   DOB: 1961-02-27, 51 y.o.   MRN: [id number]  HPI She is here for a followup visit to discuss her CAT scan results. She continues to do well  Review of Systems     Objective:   Physical Exam Her abdomen is soft and nontender. I removed the drain.  The CAT scan showed marked improvement in the diverticulitis with resolution of the fluid collections in the pelvis and resolution of the abscess    Assessment:     Patient with improved diverticulitis on oral antibiotics now status post drain removal    Plan:     She will continue her oral antibiotics. I will see her back in 4 weeks and we will then discuss definitive partial colectomy

## 2011-12-20 ENCOUNTER — Ambulatory Visit (INDEPENDENT_AMBULATORY_CARE_PROVIDER_SITE_OTHER): Payer: 59 | Admitting: Family Medicine

## 2011-12-20 ENCOUNTER — Ambulatory Visit: Payer: Self-pay | Admitting: Internal Medicine

## 2011-12-20 VITALS — BP 125/85 | HR 93 | Temp 98.2°F | Resp 16 | Ht 66.0 in | Wt 184.6 lb

## 2011-12-20 DIAGNOSIS — F411 Generalized anxiety disorder: Secondary | ICD-10-CM

## 2011-12-20 DIAGNOSIS — I471 Supraventricular tachycardia: Secondary | ICD-10-CM

## 2011-12-20 DIAGNOSIS — F419 Anxiety disorder, unspecified: Secondary | ICD-10-CM

## 2011-12-20 DIAGNOSIS — I498 Other specified cardiac arrhythmias: Secondary | ICD-10-CM

## 2011-12-20 MED ORDER — ALPRAZOLAM 1 MG PO TABS: 1.0000 mg | ORAL_TABLET | Freq: Every evening | ORAL | Status: DC | PRN

## 2011-12-20 MED ORDER — METOPROLOL SUCCINATE ER 25 MG PO TB24: 25.0000 mg | ORAL_TABLET | Freq: Every evening | ORAL | Status: DC

## 2011-12-20 NOTE — Progress Notes (Signed)
  Subjective:    Patient ID: Amanda [name], female    DOB: Jul 08, 1961, 51 y.o.   MRN: [id number]  HPI 51 yo female here for refills: 1) SVT/HtN - take toprol.  25 once a day.  Controlling it well  2) Anxiety - takes xanax. Takes xanax at night to sleep.  Doing well.    Review of Systems Negative except as per HPI      Objective:   Physical Exam  Constitutional: She appears well-developed and well-nourished.  Cardiovascular: Normal rate, regular rhythm, normal heart sounds and intact distal pulses.   No murmur heard. Pulmonary/Chest: Effort normal and breath sounds normal.  Neurological: She is alert.  Skin: Skin is warm and dry.          Assessment & Plan:  SVT, HTN, Anxiety - meds refilled for 6 months.  Okay to call in 6 months, f/u in 1 year.

## 2012-01-14 ENCOUNTER — Encounter (INDEPENDENT_AMBULATORY_CARE_PROVIDER_SITE_OTHER): Payer: Commercial Managed Care - PPO | Admitting: Surgery

## 2012-01-14 ENCOUNTER — Ambulatory Visit (INDEPENDENT_AMBULATORY_CARE_PROVIDER_SITE_OTHER): Payer: Commercial Managed Care - PPO | Admitting: Surgery

## 2012-01-14 ENCOUNTER — Encounter (INDEPENDENT_AMBULATORY_CARE_PROVIDER_SITE_OTHER): Payer: Self-pay | Admitting: Surgery

## 2012-01-14 VITALS — BP 130/78 | HR 78 | Temp 98.2°F | Resp 18 | Ht 66.5 in | Wt 185.2 lb

## 2012-01-14 DIAGNOSIS — K5732 Diverticulitis of large intestine without perforation or abscess without bleeding: Secondary | ICD-10-CM

## 2012-01-14 DIAGNOSIS — K5792 Diverticulitis of intestine, part unspecified, without perforation or abscess without bleeding: Secondary | ICD-10-CM

## 2012-01-14 NOTE — Progress Notes (Signed)
Subjective:     Patient ID: Amanda [name], female   DOB: November 22, 1960, 51 y.o.   MRN: [id number]  HPI She is here for another followup visit of her severe diverticulitis. She has been off antibiotics for a week he reports she is having some discomfort in the left lower quadrant. She denies any fevers.  Review of Systems     Objective:   Physical Exam On exam, her abdomen is soft. There is just a mild amount of guarding and minimal fullness of the left lower quadrant    Assessment:     Diverticulitis    Plan:     We will now schedule her for laparoscopic assisted partial colectomy. I discussed the risks with her in detail. We will do a preoperative bowel prep. Surgery will be scheduled

## 2012-01-19 ENCOUNTER — Encounter (HOSPITAL_COMMUNITY): Payer: Self-pay | Admitting: Pharmacy Technician

## 2012-01-24 ENCOUNTER — Encounter (HOSPITAL_COMMUNITY): Payer: Self-pay

## 2012-01-24 ENCOUNTER — Encounter (HOSPITAL_COMMUNITY)
Admission: RE | Admit: 2012-01-24 | Discharge: 2012-01-24 | Disposition: A | Discharge status: Home / Self Care | Payer: 59 | Source: Ambulatory Visit | Attending: Surgery | Admitting: Surgery

## 2012-01-24 HISTORY — DX: Anxiety disorder, unspecified: F41.9

## 2012-01-24 HISTORY — DX: Unspecified osteoarthritis, unspecified site: M19.90

## 2012-01-24 LAB — BASIC METABOLIC PANEL
BUN: 8 mg/dL (ref 6–23)
Calcium: 9.3 mg/dL (ref 8.4–10.5)
GFR calc Af Amer: >90 mL/min (ref >90)
GFR calc non Af Amer: >90 mL/min (ref >90)
Potassium: 4.5 mEq/L (ref 3.5–5.1)
Sodium: 140 mEq/L (ref 135–145)

## 2012-01-24 LAB — HCG, SERUM, QUALITATIVE: Preg, Serum: NEGATIVE

## 2012-01-24 LAB — CBC
MCH: 28.7 pg (ref 26.0–34.0)
MCHC: 32.2 g/dL (ref 30.0–36.0)
RDW: 17.8 % — ABNORMAL HIGH (ref 11.5–15.5)

## 2012-01-24 NOTE — Patient Instructions (Addendum)
20 KANNA DAFOE  01/24/2012   Your procedure is scheduled on:  01/28/12  Report to SHORT STAY DEPT  at 11:00 AM.  Call this number if you have problems the morning of surgery: [phone]   Remember:   Do not eat food or drink liquids AFTER MIDNIGHT  May have clear liquids UNTIL 6 HOURS BEFORE SURGERY (7:30 AM)  Clear liquids include soda, tea, black coffee, apple or grape juice, broth.  Take these medicines the morning of surgery with A SIP OF WATER: HYDROCODONE   Do not wear jewelry, make-up or nail polish.  Do not wear lotions, powders, or perfumes.   Do not shave legs or underarms 48 hrs. before surgery (men may shave face)  Do not bring valuables to the hospital.  Contacts, dentures or bridgework may not be worn into surgery.  Leave suitcase in the car. After surgery it may be brought to your room.  For patients admitted to the hospital, checkout time is 11:00 AM the day of discharge.   Patients discharged the day of surgery will not be allowed to drive home.    Special Instructions:   Please read over the following fact sheets that you were given: MRSA  Information               SHOWER WITH BETASEPT THE NIGHT BEFORE SURGERY AND THE MORNING OF SURGERY               NO ASPIRIN / HERBVAL MEDICATIONS 5-7 DAYS PREOP

## 2012-01-27 NOTE — H&P (Signed)
Amanda [name] is an 51 y.o. female.   Chief Complaint: hx of diverticulitis HPI: she has had a recent admission for severe diverticulitis with abscess that required percutaneous drainage and long term antibiotics.  She has improved significantly but is still having occasional LLQ abdominal discomfort. She is now presenting for Laparoscopic assisted partial colectomy  Past Medical History  Diagnosis Date  . Tachyarrhythmia 2006    s/p ablation  . Coronary artery disease     cardiologist- dr Sharlot Gowda taylor- last visit 2 yrs ago - no problems since ablation of tachpalpitations  . Congenital patella maltracking right knee  . Diverticulitis   . Arthritis   . Anxiety     Past Surgical History  Procedure Date  . Shoulder arthroscopy w/ rotator cuff repair 2010 repair bicep    right shoulder   . Left small fingernail removed 2006  . Cardiac electrophysiology study and ablation 2006    tachycardia  . Ectopic pregnancy surgery 1993  . Exploratory laparotomy GYN fertility 1990's  . Hernia repair 1988  . Knee surgery 2012    right lateral release  . Ganglion cyst excision 1997    left wrist  . Pilonidal cyst excision 1983  . Rhinoplasty   . Lasik     Family History  Problem Relation Age of Onset  . Heart disease Mother     congestive heart failure   Social History:  reports that she quit smoking about 14 months ago. She has never used smokeless tobacco. She reports that she drinks about 3.6 ounces of alcohol per week. She reports that she does not use illicit drugs.  Allergies: No Known Allergies  No prescriptions prior to admission    No results found for this or any previous visit (from the past 48 hour(s)). No results found.  Review of Systems  All other systems reviewed and are negative.    There were no vitals taken for this visit. Physical Exam  Constitutional: She is oriented to person, place, and time. She appears well-developed and well-nourished. No distress.    Cardiovascular: Normal rate, regular rhythm, normal heart sounds and intact distal pulses.   Respiratory: Effort normal and breath sounds normal. No respiratory distress. She has no wheezes. She has no rales.  GI: Soft. Bowel sounds are normal. She exhibits no distension. There is no tenderness. There is no rebound.  Neurological: She is alert and oriented to person, place, and time.  Psychiatric: Her behavior is normal. Judgment normal.     Assessment/Plan Patient with a history of severe sigmoid diverticulitis with abscess, now improved and presenting for elective partial colectomy, lap assisted.  We discussed the surgical procedure in detail.  Risks including but not limited to bleeding, infection, anastomotic leak, anastomotic breakdown, need for an ostomy, injury to surrounding structures, etc discussed. She wishes to proceed. Likelihood of success is good  Amanda [name] A 01/27/2012, 10:33 AM

## 2012-01-28 ENCOUNTER — Encounter (HOSPITAL_COMMUNITY)
Admission: RE | Disposition: A | Discharge status: Home Health | Payer: Self-pay | Source: Ambulatory Visit | Attending: Surgery

## 2012-01-28 ENCOUNTER — Encounter (HOSPITAL_COMMUNITY): Payer: Self-pay | Admitting: Anesthesiology

## 2012-01-28 ENCOUNTER — Inpatient Hospital Stay (HOSPITAL_COMMUNITY)
Admission: RE | Admit: 2012-01-28 | Discharge: 2012-02-01 | DRG: 331 | Disposition: A | Discharge status: Home Health | Payer: 59 | Source: Ambulatory Visit | Attending: Surgery | Admitting: Surgery

## 2012-01-28 ENCOUNTER — Encounter (HOSPITAL_COMMUNITY): Payer: Self-pay

## 2012-01-28 ENCOUNTER — Ambulatory Visit (HOSPITAL_COMMUNITY): Payer: 59 | Admitting: Anesthesiology

## 2012-01-28 DIAGNOSIS — I498 Other specified cardiac arrhythmias: Secondary | ICD-10-CM | POA: Diagnosis present

## 2012-01-28 DIAGNOSIS — Z01812 Encounter for preprocedural laboratory examination: Secondary | ICD-10-CM

## 2012-01-28 DIAGNOSIS — K5730 Diverticulosis of large intestine without perforation or abscess without bleeding: Secondary | ICD-10-CM

## 2012-01-28 DIAGNOSIS — K5732 Diverticulitis of large intestine without perforation or abscess without bleeding: Principal | ICD-10-CM | POA: Diagnosis present

## 2012-01-28 HISTORY — PX: PARTIAL COLECTOMY: SHX5273

## 2012-01-28 LAB — TYPE AND SCREEN: Antibody Screen: NEGATIVE

## 2012-01-28 SURGERY — LAPAROSCOPIC PARTIAL COLECTOMY
Anesthesia: General | Site: Abdomen | Wound class: Clean Contaminated

## 2012-01-28 MED ORDER — PROMETHAZINE HCL 25 MG/ML IJ SOLN: 6.2500 mg | INTRAMUSCULAR | Status: DC | PRN

## 2012-01-28 MED ORDER — NEOSTIGMINE METHYLSULFATE 1 MG/ML IJ SOLN
INTRAMUSCULAR | Status: DC | PRN
  Administered 2012-01-28: 5 mg via INTRAVENOUS

## 2012-01-28 MED ORDER — SODIUM CHLORIDE 0.9 % IV SOLN
1.0000 g | INTRAVENOUS | Status: AC
  Administered 2012-01-29: 1 g via INTRAVENOUS
  Filled 2012-01-28: qty 1

## 2012-01-28 MED ORDER — HYDROMORPHONE 0.3 MG/ML IV SOLN
INTRAVENOUS | Status: DC
  Administered 2012-01-28: 1.4 mg via INTRAVENOUS
  Administered 2012-01-28: 0.3 mg via INTRAVENOUS
  Administered 2012-01-29: 2.1 mg via INTRAVENOUS
  Administered 2012-01-29: 10:00:00 via INTRAVENOUS
  Administered 2012-01-30: 1.5 mg via INTRAVENOUS
  Administered 2012-01-30 (×2): 2.4 mg via INTRAVENOUS
  Administered 2012-01-30: 0.3 mg via INTRAVENOUS
  Administered 2012-01-30: 20:00:00 via INTRAVENOUS
  Administered 2012-01-31: 1.5 mg via INTRAVENOUS
  Administered 2012-01-31: 1.2 mg via INTRAVENOUS
  Filled 2012-01-28 (×4): qty 25

## 2012-01-28 MED ORDER — ONDANSETRON HCL 4 MG PO TABS: 4.0000 mg | ORAL_TABLET | Freq: Four times a day (QID) | ORAL | Status: DC | PRN

## 2012-01-28 MED ORDER — PROPOFOL 10 MG/ML IV BOLUS
INTRAVENOUS | Status: DC | PRN
  Administered 2012-01-28: 150 mg via INTRAVENOUS

## 2012-01-28 MED ORDER — MEPERIDINE HCL 50 MG/ML IJ SOLN: 6.2500 mg | INTRAMUSCULAR | Status: DC | PRN

## 2012-01-28 MED ORDER — SODIUM CHLORIDE 0.9 % IV SOLN
1.0000 g | INTRAVENOUS | Status: AC
  Administered 2012-01-28: 1 g via INTRAVENOUS
  Filled 2012-01-28: qty 1

## 2012-01-28 MED ORDER — ONDANSETRON HCL 4 MG/2ML IJ SOLN
INTRAMUSCULAR | Status: DC | PRN
  Administered 2012-01-28 (×2): 2 mg via INTRAVENOUS

## 2012-01-28 MED ORDER — ENOXAPARIN SODIUM 40 MG/0.4ML ~~LOC~~ SOLN
40.0000 mg | SUBCUTANEOUS | Status: DC
  Administered 2012-01-29 – 2012-01-31 (×3): 40 mg via SUBCUTANEOUS
  Filled 2012-01-28 (×4): qty 0.4

## 2012-01-28 MED ORDER — HYDROMORPHONE 0.3 MG/ML IV SOLN
INTRAVENOUS | Status: AC
  Administered 2012-01-28: 25 mL
  Filled 2012-01-28: qty 25

## 2012-01-28 MED ORDER — POTASSIUM CHLORIDE IN NACL 20-0.9 MEQ/L-% IV SOLN
INTRAVENOUS | Status: DC
  Administered 2012-01-28 – 2012-01-31 (×9): via INTRAVENOUS
  Filled 2012-01-28 (×9): qty 1000

## 2012-01-28 MED ORDER — LACTATED RINGERS IR SOLN
Status: DC | PRN
  Administered 2012-01-28: 3000 mL

## 2012-01-28 MED ORDER — DIPHENHYDRAMINE HCL 12.5 MG/5ML PO ELIX: 12.5000 mg | ORAL_SOLUTION | Freq: Four times a day (QID) | ORAL | Status: DC | PRN

## 2012-01-28 MED ORDER — LACTATED RINGERS IV SOLN
INTRAVENOUS | Status: DC | PRN
  Administered 2012-01-28: 13:00:00 via INTRAVENOUS

## 2012-01-28 MED ORDER — HYDROMORPHONE HCL PF 1 MG/ML IJ SOLN
INTRAMUSCULAR | Status: AC
  Filled 2012-01-28: qty 1

## 2012-01-28 MED ORDER — KETOROLAC TROMETHAMINE 15 MG/ML IJ SOLN
INTRAMUSCULAR | Status: AC
  Administered 2012-01-28: 15 mg via INTRAVENOUS
  Filled 2012-01-28: qty 1

## 2012-01-28 MED ORDER — SODIUM CHLORIDE 0.9 % IV BOLUS (SEPSIS)
500.0000 mL | Freq: Once | INTRAVENOUS | Status: AC
  Administered 2012-01-28: 500 mL via INTRAVENOUS

## 2012-01-28 MED ORDER — HYDROMORPHONE HCL PF 1 MG/ML IJ SOLN
0.2500 mg | INTRAMUSCULAR | Status: DC | PRN
  Administered 2012-01-28 (×3): 0.5 mg via INTRAVENOUS

## 2012-01-28 MED ORDER — LACTATED RINGERS IV SOLN: INTRAVENOUS | Status: DC

## 2012-01-28 MED ORDER — ROCURONIUM BROMIDE 100 MG/10ML IV SOLN
INTRAVENOUS | Status: DC | PRN
  Administered 2012-01-28 (×3): 10 mg via INTRAVENOUS
  Administered 2012-01-28: 20 mg via INTRAVENOUS
  Administered 2012-01-28: 50 mg via INTRAVENOUS

## 2012-01-28 MED ORDER — ALVIMOPAN 12 MG PO CAPS
12.0000 mg | ORAL_CAPSULE | Freq: Two times a day (BID) | ORAL | Status: DC
  Administered 2012-01-29 – 2012-01-30 (×4): 12 mg via ORAL
  Filled 2012-01-28 (×6): qty 1

## 2012-01-28 MED ORDER — LIDOCAINE HCL (CARDIAC) 20 MG/ML IV SOLN
INTRAVENOUS | Status: DC | PRN
  Administered 2012-01-28: 50 mg via INTRAVENOUS

## 2012-01-28 MED ORDER — ONDANSETRON HCL 4 MG/2ML IJ SOLN: 4.0000 mg | Freq: Four times a day (QID) | INTRAMUSCULAR | Status: DC | PRN

## 2012-01-28 MED ORDER — FENTANYL CITRATE 0.05 MG/ML IJ SOLN
INTRAMUSCULAR | Status: DC | PRN
  Administered 2012-01-28 (×3): 50 ug via INTRAVENOUS
  Administered 2012-01-28: 100 ug via INTRAVENOUS
  Administered 2012-01-28 (×5): 50 ug via INTRAVENOUS

## 2012-01-28 MED ORDER — SODIUM CHLORIDE 0.9 % IV SOLN
INTRAVENOUS | Status: AC
  Filled 2012-01-28: qty 1

## 2012-01-28 MED ORDER — HYDROMORPHONE HCL PF 1 MG/ML IJ SOLN
INTRAMUSCULAR | Status: DC | PRN
  Administered 2012-01-28 (×2): 1 mg via INTRAVENOUS

## 2012-01-28 MED ORDER — GLYCOPYRROLATE 0.2 MG/ML IJ SOLN
INTRAMUSCULAR | Status: DC | PRN
  Administered 2012-01-28: 0.6 mg via INTRAVENOUS

## 2012-01-28 MED ORDER — ACETAMINOPHEN 10 MG/ML IV SOLN
INTRAVENOUS | Status: AC
  Filled 2012-01-28: qty 100

## 2012-01-28 MED ORDER — ONDANSETRON HCL 4 MG/2ML IJ SOLN
4.0000 mg | Freq: Four times a day (QID) | INTRAMUSCULAR | Status: DC | PRN
  Administered 2012-01-29: 4 mg via INTRAVENOUS
  Filled 2012-01-28: qty 2

## 2012-01-28 MED ORDER — BUPIVACAINE HCL (PF) 0.5 % IJ SOLN
INTRAMUSCULAR | Status: AC
  Filled 2012-01-28: qty 30

## 2012-01-28 MED ORDER — NALOXONE HCL 0.4 MG/ML IJ SOLN: 0.4000 mg | INTRAMUSCULAR | Status: DC | PRN

## 2012-01-28 MED ORDER — LORAZEPAM 2 MG/ML IJ SOLN
1.0000 mg | Freq: Two times a day (BID) | INTRAMUSCULAR | Status: DC | PRN
  Administered 2012-01-28 – 2012-01-30 (×2): 1 mg via INTRAVENOUS
  Filled 2012-01-28 (×2): qty 1

## 2012-01-28 MED ORDER — MIDAZOLAM HCL 5 MG/5ML IJ SOLN
INTRAMUSCULAR | Status: DC | PRN
  Administered 2012-01-28: 2 mg via INTRAVENOUS
  Administered 2012-01-28 (×2): 1 mg via INTRAVENOUS

## 2012-01-28 MED ORDER — DIPHENHYDRAMINE HCL 50 MG/ML IJ SOLN: 12.5000 mg | Freq: Four times a day (QID) | INTRAMUSCULAR | Status: DC | PRN

## 2012-01-28 MED ORDER — KETOROLAC TROMETHAMINE 15 MG/ML IJ SOLN
15.0000 mg | Freq: Four times a day (QID) | INTRAMUSCULAR | Status: AC
Start: 2012-01-28 — End: 2012-01-30
  Administered 2012-01-28 – 2012-01-30 (×8): 15 mg via INTRAVENOUS
  Filled 2012-01-28 (×11): qty 1

## 2012-01-28 MED ORDER — SODIUM CHLORIDE 0.9 % IJ SOLN: 9.0000 mL | INTRAMUSCULAR | Status: DC | PRN

## 2012-01-28 MED ORDER — METOPROLOL SUCCINATE ER 25 MG PO TB24
25.0000 mg | ORAL_TABLET | Freq: Every day | ORAL | Status: DC
  Administered 2012-01-28 – 2012-01-31 (×4): 25 mg via ORAL
  Filled 2012-01-28 (×5): qty 1

## 2012-01-28 SURGICAL SUPPLY — 72 items
APPLIER CLIP 5 13 M/L LIGAMAX5 (MISCELLANEOUS)
APPLIER CLIP ROT 10 11.4 M/L (STAPLE) ×2
BLADE EXTENDED COATED 6.5IN (ELECTRODE) ×2 IMPLANT
BLADE HEX COATED 2.75 (ELECTRODE) ×2 IMPLANT
BLADE SURG SZ10 CARB STEEL (BLADE) ×2 IMPLANT
CABLE HIGH FREQUENCY MONO STRZ (ELECTRODE) ×2 IMPLANT
CANISTER SUCTION 2500CC (MISCELLANEOUS) ×2 IMPLANT
CELLS DAT CNTRL 66122 CELL SVR (MISCELLANEOUS) IMPLANT
CHLORAPREP W/TINT 26ML (MISCELLANEOUS) ×2 IMPLANT
CLIP APPLIE 5 13 M/L LIGAMAX5 (MISCELLANEOUS) IMPLANT
CLIP APPLIE ROT 10 11.4 M/L (STAPLE) ×1 IMPLANT
CLOTH BEACON ORANGE TIMEOUT ST (SAFETY) ×2 IMPLANT
COVER MAYO STAND STRL (DRAPES) ×2 IMPLANT
DECANTER SPIKE VIAL GLASS SM (MISCELLANEOUS) ×2 IMPLANT
DRAIN CHANNEL 19F RND (DRAIN) IMPLANT
DRAPE LAPAROSCOPIC ABDOMINAL (DRAPES) ×2 IMPLANT
DRAPE LG THREE QUARTER DISP (DRAPES) ×2 IMPLANT
DRAPE WARM FLUID 44X44 (DRAPE) ×2 IMPLANT
DRSG AQUACEL AG ADV 3.5X 4 (GAUZE/BANDAGES/DRESSINGS) IMPLANT
DRSG AQUACEL AG ADV 3.5X 6 (GAUZE/BANDAGES/DRESSINGS) IMPLANT
DRSG PAD ABDOMINAL 8X10 ST (GAUZE/BANDAGES/DRESSINGS) ×2 IMPLANT
ELECT REM PT RETURN 9FT ADLT (ELECTROSURGICAL) ×2
ELECTRODE REM PT RTRN 9FT ADLT (ELECTROSURGICAL) ×1 IMPLANT
EVACUATOR DRAINAGE 10X20 100CC (DRAIN) IMPLANT
EVACUATOR SILICONE 100CC (DRAIN)
GLOVE BIOGEL PI IND STRL 7.0 (GLOVE) ×1 IMPLANT
GLOVE BIOGEL PI INDICATOR 7.0 (GLOVE) ×1
GLOVE SURG SIGNA 7.5 PF LTX (GLOVE) ×6 IMPLANT
GOWN STRL NON-REIN LRG LVL3 (GOWN DISPOSABLE) ×2 IMPLANT
GOWN STRL REIN XL XLG (GOWN DISPOSABLE) ×4 IMPLANT
HAND ACTIVATED (MISCELLANEOUS) ×2 IMPLANT
KIT BASIN OR (CUSTOM PROCEDURE TRAY) ×2 IMPLANT
LEGGING LITHOTOMY PAIR STRL (DRAPES) ×2 IMPLANT
NS IRRIG 1000ML POUR BTL (IV SOLUTION) ×4 IMPLANT
PENCIL BUTTON HOLSTER BLD 10FT (ELECTRODE) ×2 IMPLANT
REGULATOR SUCTION ADULT (MISCELLANEOUS) IMPLANT
RTRCTR WOUND ALEXIS 18CM MED (MISCELLANEOUS)
SCISSORS LAP 5X35 DISP (ENDOMECHANICALS) ×2 IMPLANT
SEALER TISSUE G2 CVD JAW 35 (ENDOMECHANICALS) ×1 IMPLANT
SEALER TISSUE G2 CVD JAW 45CM (ENDOMECHANICALS) ×1 IMPLANT
SEALER TISSUE X1 CVD JAW (INSTRUMENTS) IMPLANT
SET IRRIG TUBING LAPAROSCOPIC (IRRIGATION / IRRIGATOR) ×2 IMPLANT
SOLUTION ANTI FOG 6CC (MISCELLANEOUS) ×2 IMPLANT
SPONGE GAUZE 4X4 12PLY (GAUZE/BANDAGES/DRESSINGS) ×2 IMPLANT
SPONGE LAP 18X18 X RAY DECT (DISPOSABLE) ×4 IMPLANT
STAPLER CIRC CVD 29MM 37CM (STAPLE) ×2 IMPLANT
STAPLER CUT CVD 40MM BLUE (STAPLE) ×2 IMPLANT
STAPLER PROXIMATE 75MM BLUE (STAPLE) ×2 IMPLANT
STAPLER VISISTAT 35W (STAPLE) ×2 IMPLANT
SUCTION POOLE TIP (SUCTIONS) ×2 IMPLANT
SUT NYLON 3 0 (SUTURE) ×4 IMPLANT
SUT PDS AB 1 CTX 36 (SUTURE) ×4 IMPLANT
SUT PDS AB 1 TP1 96 (SUTURE) IMPLANT
SUT PROLENE 2 0 KS (SUTURE) IMPLANT
SUT SILK 2 0 (SUTURE) ×1
SUT SILK 2 0 SH CR/8 (SUTURE) ×2 IMPLANT
SUT SILK 2-0 18XBRD TIE 12 (SUTURE) ×1 IMPLANT
SUT SILK 3 0 (SUTURE) ×1
SUT SILK 3 0 SH CR/8 (SUTURE) ×2 IMPLANT
SUT SILK 3-0 18XBRD TIE 12 (SUTURE) ×1 IMPLANT
SUT VIC AB 2-0 SH 18 (SUTURE) ×4 IMPLANT
SUT VICRYL 2 0 18  UND BR (SUTURE) ×2
SUT VICRYL 2 0 18 UND BR (SUTURE) ×2 IMPLANT
SYR 30ML LL (SYRINGE) IMPLANT
TAPE CLOTH SURG 4X10 WHT LF (GAUZE/BANDAGES/DRESSINGS) ×2 IMPLANT
TRAY FOLEY CATH 14FRSI W/METER (CATHETERS) ×2 IMPLANT
TRAY LAP CHOLE (CUSTOM PROCEDURE TRAY) ×2 IMPLANT
TROCAR XCEL BLUNT TIP 100MML (ENDOMECHANICALS) ×2 IMPLANT
TROCAR XCEL NON-BLD 5MMX100MML (ENDOMECHANICALS) ×2 IMPLANT
TUBING FILTER THERMOFLATOR (ELECTROSURGICAL) IMPLANT
YANKAUER SUCT BULB TIP 10FT TU (MISCELLANEOUS) ×2 IMPLANT
YANKAUER SUCT BULB TIP NO VENT (SUCTIONS) ×2 IMPLANT

## 2012-01-28 NOTE — Interval H&P Note (Signed)
History and Physical Interval Note:  She has had no change in her history or exam  01/28/2012 1:05 PM  Amanda [name]  has presented today for surgery, with the diagnosis of diverticulitis  The various methods of treatment have been discussed with the patient and family. After consideration of risks, benefits and other options for treatment, the patient has consented to  Procedure(s) (LRB): LAPAROSCOPIC PARTIAL COLECTOMY (N/A) as a surgical intervention .  The patients' history has been reviewed, patient examined, no change in status, stable for surgery.  I have reviewed the patients' chart and labs.  Questions were answered to the patient's satisfaction.     Fern Canova A

## 2012-01-28 NOTE — Anesthesia Postprocedure Evaluation (Signed)
  Anesthesia Post-op Note  Patient: Amanda [name]  Procedure(s) Performed: Procedure(s) (LRB): LAPAROSCOPIC PARTIAL COLECTOMY (N/A)  Patient Location: PACU  Anesthesia Type: General  Level of Consciousness: awake and alert   Airway and Oxygen Therapy: Patient Spontanous Breathing  Post-op Pain: mild  Post-op Assessment: Post-op Vital signs reviewed, Patient's Cardiovascular Status Stable, Respiratory Function Stable, Patent Airway and No signs of Nausea or vomiting  Post-op Vital Signs: stable  Complications: No apparent anesthesia complications

## 2012-01-28 NOTE — Anesthesia Preprocedure Evaluation (Signed)
Anesthesia Evaluation  Patient identified by MRN, date of birth, ID band Patient awake    Reviewed: Allergy & Precautions, H&P , NPO status , Patient's Chart, lab work & pertinent test results, reviewed documented beta blocker date and time   History of Anesthesia Complications Negative for: history of anesthetic complications  Airway Mallampati: II TM Distance: <3 FB   Mouth opening: Limited Mouth Opening  Dental No notable dental hx.    Pulmonary neg pulmonary ROS,  breath sounds clear to auscultation  Pulmonary exam normal       Cardiovascular - CAD negative cardio ROS  + dysrhythmias (s/p ablantion) Supra Ventricular Tachycardia Rhythm:regular Rate:Normal     Neuro/Psych negative neurological ROS  negative psych ROS   GI/Hepatic negative GI ROS, Neg liver ROS,   Endo/Other  negative endocrine ROS  Renal/GU negative Renal ROS  negative genitourinary   Musculoskeletal negative musculoskeletal ROS (+)   Abdominal   Peds negative pediatric ROS (+)  Hematology negative hematology ROS (+)   Anesthesia Other Findings Two upper front veneers   Reproductive/Obstetrics negative OB ROS                           Anesthesia Physical  Anesthesia Plan  ASA: II  Anesthesia Plan: General   Post-op Pain Management:    Induction: Intravenous  Airway Management Planned: Oral ETT  Additional Equipment:   Intra-op Plan:   Post-operative Plan: Extubation in OR  Informed Consent: I have reviewed the patients History and Physical, chart, labs and discussed the procedure including the risks, benefits and alternatives for the proposed anesthesia with the patient or authorized representative who has indicated his/her understanding and acceptance.   Dental Advisory Given  Plan Discussed with: CRNA  Anesthesia Plan Comments:         Anesthesia Quick Evaluation

## 2012-01-28 NOTE — Transfer of Care (Signed)
Immediate Anesthesia Transfer of Care Note  Patient: Amanda [name]  Procedure(s) Performed: Procedure(s) (LRB): LAPAROSCOPIC PARTIAL COLECTOMY (N/A)  Patient Location: PACU  Anesthesia Type: General  Level of Consciousness: awake and sedated  Airway & Oxygen Therapy: Patient Spontanous Breathing and Patient connected to face mask oxygen  Post-op Assessment: Report given to PACU RN and Post -op Vital signs reviewed and stable  Post vital signs: Reviewed and stable  Complications: No apparent anesthesia complications

## 2012-01-28 NOTE — Anesthesia Procedure Notes (Signed)
Procedure Name: Intubation Date/Time: 01/28/2012 1:35 PM Performed by: Paris Lore Pre-anesthesia Checklist: Patient identified, Emergency Drugs available, Suction available, Patient being monitored and Timeout performed Patient Re-evaluated:Patient Re-evaluated prior to inductionOxygen Delivery Method: Circle system utilized Preoxygenation: Pre-oxygenation with 100% oxygen Intubation Type: IV induction Ventilation: Mask ventilation without difficulty Laryngoscope Size: Mac, 4, Miller and 3 Grade View: Grade III Tube type: Oral Number of attempts: 2 (first attempt with MAC 4 bogie Grade III - [place] 2 with bogie teeth as per pre-op) Airway Equipment and Method: Patient positioned with wedge pillow and Bougie stylet Placement Confirmation: positive ETCO2,  CO2 detector and breath sounds checked- equal and bilateral Secured at: 21 cm Tube secured with: Tape Dental Injury: Teeth and Oropharynx as per pre-operative assessment  Difficulty Due To: Difficulty was anticipated and Difficult Airway- due to anterior larynx

## 2012-01-28 NOTE — Op Note (Signed)
LAPAROSCOPIC PARTIAL COLECTOMY  Procedure Note  Amanda Amanda [name] 01/28/2012   Pre-op Diagnosis: History of diverticulitis with abscess     Post-op Diagnosis: same  Procedure(s): LAPAROSCOPIC ASSISTED PARTIAL COLECTOMY  Surgeon(s): Shelly Rubenstein, MD Velora Heckler, MD  Anesthesia: General  Staff:  Gerda Diss, RN - Circulator Clarnce Flock, CST - Scrub Person [facility], [place] - Scrub Person Victoriano Lain Allred, RN - Relief Circulator Erik Obey, CST - Scrub Person  Estimated Blood Loss: Minimal               Specimens: sigmoid colon         Indications: This is Amanda [name] who several months ago presented with sigmoid diverticulitis with significant abscesses in the pelvis. She was able to be managed with percutaneous drains. She eventually improved but still had low-grade discomfort so decision was made to proceed with partial sigmoid colectomy  Procedure: The patient was brought to the operating room and identified as the correct patient. She was placed on the operating table and general anesthesia was induced. The patient was in place the lithotomy position. Her abdomen was then prepped and draped in the usual sterile fashion. Using Amanda [name] scalpel and made Amanda [name] small incision just below the umbilicus. I took this down to the fascia which was then opened the scalpel. Amanda [name] hemostat was then used to pass into the peritoneal cavity under direct vision. Amanda [name] 0 Vicryl sutures and placed around the fascial opening. The [facility] port was placed in the opening and insufflation of the abdomen was begun. I placed another 5 and therefore the patient's lower midline as well as another port in the right lower quadrant both under direct vision. The patient has significant diverticular changes in the sigmoid colon. Amanda [name] large the sigmoid colon was firmly stuck to the pelvic sidewall including the left adnexa and fallopian tube. I was able to free up the small bowel from the pelvis. I  then mobilized the proximal colon along the white line of Toldt. It became apparent there was quite Amanda [name] redundant loop of bowel stuck to the pelvic sidewall so decision was then made to convert to an open procedure. I then removed the ports. I deflated the abdomen. I then connected the umbilical port with the lower midline port with Amanda [name] scalpel. I took this down to the fascia the peritoneum with the electrocautery. I then evaluated the sigmoid colon. After both blunt and sharp dissection with cautery I was able to free it up off of the adnexa and fallopian tube. It was Amanda [name] very redundant thickened hard loop of colon. No purulence or abscess was identified. At this point I was able to identify rectum I took the mesentery surrounding it and then transected the rectum with the contour stapler. I then took down the rest of the mesentery working proximal. I was able to identify healthy appearing colon proximal to the sigmoid loop with the diverticulitis. I took the mesentery down with the cautery device to this area. I then transected the colon here with the cautery. The specimen was sent to pathology for evaluation. I placed Amanda [name] 2-0 Prolene pursestring suture around the opening of colon. I then brought Amanda [name] 29 EEA stapler also the field. The anvil was placed in the apparent descending colon. The Prolene suture was then tightened down closing the colon around the anvil. I then brought the proximal end of the EEA stapler through the anus and up to the end of the  rectal stump. I opened up the stapling device bringing out the proximal end. I then attached the 2 ends of the stapling device together and slowly closed the device bringing the proximal colon down to the rectum.  The stapling device was then fired creating the colorectal anastomosis. I then removed the stapling device and checked the donuts which appeared to be intact. I then filled the pelvis full of saline. I placed Amanda [name] proctoscope into the anus and insufflated the colon  around the anastomosis. No evidence of leak or air bubbles was identified. I then reinforced the staple line with several interrupted 3-0 silk sutures. Amanda [name] pink and well-perfused anastomosis without tension appeared to be achieved. I then irrigated the pelvis with several liters of saline. Hemostasis appeared to be achieved. I placed Amanda [name] 34 [place] Blake drain into the pelvis through the separate right lower quadrant port site. I sewed this in place with Amanda [name] nylon suture and placed Amanda [name] drain to bulb suction. I then closed the patient's midline fascia with Amanda [name] running looped #1 PDS suture. The skin was then irrigated and closed with skin staples. Gauze and tape were then applied. The patient tolerated the procedure well. All the counts were correct at the end of the procedure. The patient was then extubated in the operating room and taken in Amanda [name] stable condition to the recovery room. Amanda Amanda [name]   Date: 01/28/2012  Time: 3:29 PM

## 2012-01-29 LAB — BASIC METABOLIC PANEL
CO2: 27 mEq/L (ref 19–32)
Chloride: 104 mEq/L (ref 96–112)
Creatinine, Ser: 0.59 mg/dL (ref 0.50–1.10)
Glucose, Bld: 96 mg/dL (ref 70–99)
Sodium: 137 mEq/L (ref 135–145)

## 2012-01-29 LAB — CBC
HCT: 33.5 % — ABNORMAL LOW (ref 36.0–46.0)
MCH: 29.2 pg (ref 26.0–34.0)
MCV: 90.5 fL (ref 78.0–100.0)
RBC: 3.7 MIL/uL — ABNORMAL LOW (ref 3.87–5.11)
WBC: 9.7 10*3/uL (ref 4.0–10.5)

## 2012-01-29 MED ORDER — SODIUM CHLORIDE 0.9 % IV SOLN
1.0000 g | Freq: Once | INTRAVENOUS | Status: AC
  Administered 2012-01-29: 1 g via INTRAVENOUS
  Filled 2012-01-29: qty 10

## 2012-01-29 MED ORDER — PANTOPRAZOLE SODIUM 40 MG IV SOLR
40.0000 mg | INTRAVENOUS | Status: DC
  Administered 2012-01-29 – 2012-01-31 (×3): 40 mg via INTRAVENOUS
  Filled 2012-01-29 (×4): qty 40

## 2012-01-29 NOTE — Progress Notes (Signed)
Patient heart rate remained less than   120 bpm. Has been asymptomatic. Foley catheter removed.

## 2012-01-29 NOTE — Progress Notes (Signed)
Patient continues with heart rate tachycardic, is asymptomatic, alert and oriented. Is tired and weak from surgery. Continues to use dilaudid pca. Monitoring heart rate

## 2012-01-29 NOTE — Progress Notes (Signed)
Patient ID: Amanda [name], female   DOB: August 18, 1961, 51 y.o.   MRN: [id number]  General Surgery - [facility] - Progress Note  POD# 1  Subjective: Patient without complaint.  Has not been OOB.  Using PCA as needed.  Objective: Vital signs in last 24 hours: Temp:  [97 F (36.1 C)-99.1 F (37.3 C)] 98.6 F (37 C) (05/18 0619) Pulse Rate:  [60-126] 91  (05/18 0844) Resp:  [9-20] 14  (05/18 0844) BP: (106-164)/(69-89) 106/73 mmHg (05/18 0619) SpO2:  [98 %-100 %] 99 % (05/18 0844) Weight:  [184 lb 2 oz (83.519 kg)] 184 lb 2 oz (83.519 kg) (05/17 1712) Last BM Date: 01/28/12  Intake/Output from previous day: 05/17 0701 - 05/18 0700 In: 800 [I.V.:800] Out: 2220 [Urine:1850; Drains:220; Blood:150]  Exam: HEENT - clear, not icteric Neck - soft Chest - clear bilaterally Cor - RRR, no murmur Abd - soft, dressing dry and intact; few BS present Ext - no significant edema Neuro - grossly intact, no focal deficits  Lab Results:   [facility] 01/29/12 0450  WBC 9.7  HGB 10.8*  HCT 33.5*  PLT 299     Basename 01/29/12 0450  NA 137  K 3.7  CL 104  CO2 27  GLUCOSE 96  BUN 4*  CREATININE 0.59  CALCIUM 8.0*    Studies/Results: No results found.  Assessment / Plan: 1.  Status post sigmoid colectomy for diverticular disease  - NPO except ice today  - OOB, ambulate  - IVF, replete calcium  - check labs in AM 5/19  Velora Heckler, MD, [facility] Office: [phone]  01/29/2012

## 2012-01-29 NOTE — Progress Notes (Signed)
Patient heart rate continues tachy, dr. Gerrit Friends was notified, patient also informed staff that she had cariac ablation and svt in 2009. Received order to do ekg, if it is just sinus tach then to just monitor, if anything else call doctor back. ekg done and was sinus tach, heart rate remains at 120 apically. Is using dilaudid pca frequently, causing respirations to decrease when sleeping then setting off alarm on monitor which wakes her up and increases heart rate. Will continue to monitor status frequently, is alert and oriented. Foley catheter draining clear yellow urine.

## 2012-01-29 NOTE — Progress Notes (Signed)
Patient having tachycardia, heart rate not going lower then 115, is using dilauded pca, dr. Gerrit Friends on call was notified, order for ativan iv and bolus 500cc ns. Both orders carried out. Patient sleeping off and on.

## 2012-01-30 LAB — BASIC METABOLIC PANEL
Calcium: 8.4 mg/dL (ref 8.4–10.5)
GFR calc non Af Amer: >90 mL/min (ref >90)
Glucose, Bld: 92 mg/dL (ref 70–99)
Sodium: 136 mEq/L (ref 135–145)

## 2012-01-30 NOTE — Progress Notes (Signed)
Pt c/o reflux and heartburn, md called and orders given

## 2012-01-30 NOTE — Progress Notes (Signed)
Spoke to Dr. Gerrit Friends informed him patient had first post op BM and was dark and bloody.

## 2012-01-30 NOTE — Progress Notes (Signed)
Patient ID: Amanda [name], female   DOB: March 28, 1961, 51 y.o.   MRN: [id number]  General Surgery - [facility] - Progress Note  POD# 2  Subjective: Patient without complaints.  Pain controlled good.  Ambulating.  Ready for liquids.  No flatus or BM.  Objective: Vital signs in last 24 hours: Temp:  [97.2 F (36.2 C)-98.7 F (37.1 C)] 98.4 F (36.9 C) (05/19 0426) Pulse Rate:  [86-98] 94  (05/19 0426) Resp:  [13-18] 18  (05/19 0426) BP: (109-122)/(70-76) 116/70 mmHg (05/19 0426) SpO2:  [90 %-100 %] 98 % (05/19 0426) Last BM Date: 01/28/12  Intake/Output from previous day: 05/18 0701 - 05/19 0700 In: 3110 [I.V.:3000; IV Piggyback:110] Out: 1380 [Urine:1300; Drains:80]  Exam: HEENT - clear, not icteric Neck - soft Chest - clear bilaterally Cor - RRR, no murmur Abd - mild distension; BS present; dressing dry and intact Ext - no significant edema Neuro - grossly intact, no focal deficits  Lab Results:   [facility] 01/29/12 0450  WBC 9.7  HGB 10.8*  HCT 33.5*  PLT 299     Basename 01/30/12 0444 01/29/12 0450  NA 136 137  K 4.2 3.7  CL 102 104  CO2 22 27  GLUCOSE 92 96  BUN 4* 4*  CREATININE 0.49* 0.59  CALCIUM 8.4 8.0*    Studies/Results: No results found.  Assessment / Plan: 1.  Status post sigmoid colectomy for diverticular disease  - begin CL diet  - ambulate  - decrease IVF  - PCA for pain control  Velora Heckler, MD, [facility] Office: [phone]  01/30/2012

## 2012-01-31 MED ORDER — HYDROMORPHONE HCL PF 1 MG/ML IJ SOLN
1.0000 mg | INTRAMUSCULAR | Status: DC | PRN
  Administered 2012-01-31: 1 mg via INTRAVENOUS
  Filled 2012-01-31: qty 1

## 2012-01-31 MED ORDER — OXYCODONE-ACETAMINOPHEN 5-325 MG PO TABS
1.0000 | ORAL_TABLET | ORAL | Status: DC | PRN
  Administered 2012-01-31 – 2012-02-01 (×6): 2 via ORAL
  Filled 2012-01-31 (×6): qty 2

## 2012-01-31 NOTE — Progress Notes (Signed)
UR complete 

## 2012-01-31 NOTE — Progress Notes (Signed)
3 Days Post-Op  Subjective: POD#3 +flatus and BM No nausea  Objective: Vital signs in last 24 hours: Temp:  [97.8 F (36.6 C)-98.8 F (37.1 C)] 98.3 F (36.8 C) (05/20 0609) Pulse Rate:  [91-93] 91  (05/20 0609) Resp:  [13-21] 13  (05/20 0609) BP: (137-145)/(81-90) 145/90 mmHg (05/20 0609) SpO2:  [95 %-100 %] 95 % (05/20 0609) Last BM Date: 01/30/12  Intake/Output from previous day: 05/19 0701 - 05/20 0700 In: 3589.2 [P.O.:1580; I.V.:1939.2] Out: 4975 [OZHYQ:6578; Drains:100] Intake/Output this shift:   Abdomen soft, incision clean Drain serous  Lab Results:   [facility] 01/29/12 0450  WBC 9.7  HGB 10.8*  HCT 33.5*  PLT 299   BMET  Basename 01/30/12 0444 01/29/12 0450  NA 136 137  K 4.2 3.7  CL 102 104  CO2 22 27  GLUCOSE 92 96  BUN 4* 4*  CREATININE 0.49* 0.59  CALCIUM 8.4 8.0*   PT/INR No results found for this basename: LABPROT:2,INR:2 in the last 72 hours ABG No results found for this basename: PHART:2,PCO2:2,PO2:2,HCO3:2 in the last 72 hours  Studies/Results: No results found.  Anti-infectives: Anti-infectives     Start     Dose/Rate Route Frequency Ordered Stop   01/29/12 1300   ertapenem (INVANZ) 1 g in sodium chloride 0.9 % 50 mL IVPB        1 g 100 mL/hr over 30 Minutes Intravenous Every 24 hours 01/28/12 1733 01/29/12 1251   01/28/12 1147   ertapenem (INVANZ) 1 g in sodium chloride 0.9 % 50 mL IVPB        1 g 100 mL/hr over 30 Minutes Intravenous 60 min pre-op 01/28/12 1147 01/28/12 1320          Assessment/Plan: s/p Procedure(s) (LRB): LAPAROSCOPIC PARTIAL COLECTOMY (N/A)  Advance po D/C PCA   LOS: 3 days    Maddex Garlitz A 01/31/2012

## 2012-02-01 MED ORDER — OXYCODONE-ACETAMINOPHEN 5-325 MG PO TABS: 1.0000 | ORAL_TABLET | ORAL | Status: AC | PRN

## 2012-02-01 NOTE — Progress Notes (Signed)
Patient ID: Amanda [name], female   DOB: 1961-07-20, 51 y.o.   MRN: [id number] POD#4  No complaints +BM Pain controlled  Abdomen soft, incision healing well Drain serosang  Plan:  D/c drain D/c home

## 2012-02-01 NOTE — Discharge Summary (Signed)
Physician Discharge Summary  Patient ID: Amanda [name] MRN: [id number] DOB/AGE: 03-04-1961 51 y.o.  Admit date: 01/28/2012 Discharge date: 02/01/2012  Admission Diagnoses:  Diverticulitis  Discharge Diagnoses: same Active Problems:  * No active hospital problems. *    Discharged Condition: good  Hospital Course: admitted for elective partial colectomy.  Uneventful post op course.  Tolerating po and having BM's on POD#4.  Discharged home  Consults: None  Significant Diagnostic Studies: 0  Treatments: surgery: lap assisted partial colectomy  Discharge Exam: Blood pressure 132/85, pulse 90, temperature 98 F (36.7 C), temperature source Oral, resp. rate 18, height 5' 6.5" (1.689 m), weight 184 lb 2 oz (83.519 kg), last menstrual period 12/01/2011, SpO2 96.00%. General appearance: alert and no distress GI: soft, non-tender; bowel sounds normal; no masses,  no organomegaly Incision/Wound: clean with no erythema  Disposition: 06-Home-Health Care Svc  Discharge Orders    Future Appointments: Provider: Department: Dept Phone: Center:   02/08/2012 12:00 PM Shelly Rubenstein, MD [facility] [phone] None     Medication List  As of 02/01/2012  6:26 AM   TAKE these medications         ALPRAZolam 1 MG tablet   Commonly known as: XANAX   Take 1 tablet (1 mg total) by mouth at bedtime as needed for sleep.      CIPRO PO   Take 1 tablet by mouth 2 (two) times daily. For 14 days      glucosamine-chondroitin 500-400 MG tablet   Take 1 tablet by mouth daily.      HYDROcodone-acetaminophen 5-325 MG per tablet   Commonly known as: NORCO   Take 1 tablet by mouth every 6 (six) hours as needed. For pain      metoprolol succinate 25 MG 24 hr tablet   Commonly known as: TOPROL-XL   Take 1 tablet (25 mg total) by mouth every evening.      metroNIDAZOLE 500 MG tablet   Commonly known as: FLAGYL   Take 500 mg by mouth 3 (three) times daily. For 2 weeks      multivitamin  tablet   Take 1 tablet by mouth daily.      OVER THE COUNTER MEDICATION   PROBIOTIC ONE DAILY      oxyCODONE-acetaminophen 5-325 MG per tablet   Commonly known as: PERCOCET   Take 1 tablet by mouth every 4 (four) hours as needed for pain.      PRESCRIPTION MEDICATION   Take 1 tablet by mouth 2 (two) times daily. Vinevo-Naproxen      PRESCRIPTION MEDICATION   VIMOVO 500/20MG  ONE DAILY  (COATED NAPROSYN)           Follow-up Information    Follow up with Shelly Rubenstein, MD. Call on 02/08/2012. [phone])    Contact information:   [facility] [address] [place] [address] [phone]          Signed: Shelly Rubenstein 02/01/2012, 6:26 AM

## 2012-02-01 NOTE — Discharge Instructions (Signed)
[facility]      [facility] [phone]  OPEN ABDOMINAL SURGERY: POST OP INSTRUCTIONS  Always review your discharge instruction sheet given to you by the facility where your surgery was performed.  IF YOU HAVE DISABILITY OR FAMILY LEAVE FORMS, YOU MUST BRING THEM TO THE OFFICE FOR PROCESSING.  PLEASE DO NOT GIVE THEM TO YOUR DOCTOR.  1. A prescription for pain medication may be given to you upon discharge.  Take your pain medication as prescribed, if needed.  If narcotic pain medicine is not needed, then you may take acetaminophen (Tylenol) or ibuprofen (Advil) as needed. 2. Take your usually prescribed medications unless otherwise directed. 3. If you need a refill on your pain medication, please contact your pharmacy. They will contact our office to request authorization.  Prescriptions will not be filled after 5pm or on week-ends. 4. You should follow a light diet the first few days after arrival home, such as soup and crackers, pudding, etc.unless your doctor has advised otherwise. A high-fiber, low fat diet can be resumed as tolerated.   Be sure to include lots of fluids daily. Most patients will experience some swelling and bruising on the chest and neck area.  Ice packs will help.  Swelling and bruising can take several days to resolve 5. Most patients will experience some swelling and bruising in the area of the incision. Ice pack will help. Swelling and bruising can take several days to resolve..  6. It is common to experience some constipation if taking pain medication after surgery.  Increasing fluid intake and taking a stool softener will usually help or prevent this problem from occurring.  A mild laxative (Milk of Magnesia or Miralax) should be taken according to package directions if there are no bowel movements after 48 hours. 7.  You may have steri-strips (small skin tapes) in place directly over the incision.  These strips should be left on the skin for 7-10 days.  If your  surgeon used skin glue on the incision, you may shower in 24 hours.  The glue will flake off over the next 2-3 weeks.  Any sutures or staples will be removed at the office during your follow-up visit. You may find that a light gauze bandage over your incision may keep your staples from being rubbed or pulled. You may shower and replace the bandage daily. 8. ACTIVITIES:  You may resume regular (light) daily activities beginning the next day--such as daily self-care, walking, climbing stairs--gradually increasing activities as tolerated.  You may have sexual intercourse when it is comfortable.  Refrain from any heavy lifting or straining until approved by your doctor. a. You may drive when you no longer are taking prescription pain medication, you can comfortably wear a seatbelt, and you can safely maneuver your car and apply brakes b. Return to Work: ___________________________________ 9. You should see your doctor in the office for a follow-up appointment approximately two weeks after your surgery.  Make sure that you call for this appointment within a day or two after you arrive home to insure a convenient appointment time. OTHER INSTRUCTIONS:  _____________________________________________________________ _____________________________________________________________  WHEN TO CALL YOUR DOCTOR: 1. Fever over 101.0 2. Inability to urinate 3. Nausea and/or vomiting 4. Extreme swelling or bruising 5. Continued bleeding from incision. 6. Increased pain, redness, or drainage from the incision. 7. Difficulty swallowing or breathing 8. Muscle cramping or spasms. 9. Numbness or tingling in hands or feet or around lips.  The clinic staff is available to   answer your questions during regular business hours.  Please don't hesitate to call and ask to speak to one of the nurses if you have concerns.  For further questions, please visit [email or web address]   

## 2012-02-08 ENCOUNTER — Encounter (INDEPENDENT_AMBULATORY_CARE_PROVIDER_SITE_OTHER): Payer: Self-pay | Admitting: Surgery

## 2012-02-08 ENCOUNTER — Ambulatory Visit (INDEPENDENT_AMBULATORY_CARE_PROVIDER_SITE_OTHER): Payer: Commercial Managed Care - PPO | Admitting: Surgery

## 2012-02-08 VITALS — BP 114/72 | HR 66 | Temp 97.4°F | Resp 12 | Ht 66.5 in | Wt 180.2 lb

## 2012-02-08 DIAGNOSIS — Z09 Encounter for follow-up examination after completed treatment for conditions other than malignant neoplasm: Secondary | ICD-10-CM

## 2012-02-08 NOTE — Progress Notes (Signed)
Subjective:     Patient ID: Amanda [name], female   DOB: 1960/12/05, 51 y.o.   MRN: [id number]  HPI She is here for her first postop visit status post partial colectomy for sigmoid diverticulitis. She is doing well and has no complaints. She denies fevers and her bowel movements are normal Review of Systems     Objective:   Physical Exam    On exam, she looks great. Her abdomen is soft and her incision is well healing. We removed the staples and placed Steri-Strips Assessment:     Patient status post sigmoid colectomy for diverticulitis with abscess    Plan:     She was to refrain from heavy lifting until June 28. She is going to return to work on June 3 for 1/2 half days for one week and then full time starting June 10.  I reviewed her Percocet. I will see her back as needed

## 2012-02-15 ENCOUNTER — Telehealth (INDEPENDENT_AMBULATORY_CARE_PROVIDER_SITE_OTHER): Payer: Self-pay

## 2012-02-15 ENCOUNTER — Encounter (INDEPENDENT_AMBULATORY_CARE_PROVIDER_SITE_OTHER): Payer: Self-pay | Admitting: Surgery

## 2012-02-15 ENCOUNTER — Telehealth (INDEPENDENT_AMBULATORY_CARE_PROVIDER_SITE_OTHER): Payer: Self-pay | Admitting: General Surgery

## 2012-02-15 NOTE — Telephone Encounter (Signed)
Called pt to let her know that she has a written Rx for Percocet 5/325 at check in I leave her message on cell and home about the Rx

## 2012-02-15 NOTE — Telephone Encounter (Signed)
Patient is requesting more Oxycodone 5/325.  [place] the beach and [place] more active and feels she requires more.

## 2012-02-21 ENCOUNTER — Telehealth (INDEPENDENT_AMBULATORY_CARE_PROVIDER_SITE_OTHER): Payer: Self-pay | Admitting: General Surgery

## 2012-02-21 NOTE — Telephone Encounter (Signed)
Pt calling for pain med refill.  Will call in Hydrocodone 5/325 mg, # 30, 1-2 po Q4-6H prn pain , no refill.  Called to [facility] out-patient pharmacy:  [phone].

## 2012-05-29 ENCOUNTER — Other Ambulatory Visit (HOSPITAL_COMMUNITY): Payer: Self-pay | Admitting: Obstetrics & Gynecology

## 2012-05-29 DIAGNOSIS — Z1231 Encounter for screening mammogram for malignant neoplasm of breast: Secondary | ICD-10-CM

## 2012-06-19 ENCOUNTER — Ambulatory Visit (HOSPITAL_COMMUNITY)
Admission: RE | Admit: 2012-06-19 | Discharge: 2012-06-19 | Disposition: A | Discharge status: Home / Self Care | Payer: 59 | Source: Ambulatory Visit | Attending: Obstetrics & Gynecology | Admitting: Obstetrics & Gynecology

## 2012-06-19 DIAGNOSIS — Z1231 Encounter for screening mammogram for malignant neoplasm of breast: Secondary | ICD-10-CM | POA: Insufficient documentation

## 2012-11-23 ENCOUNTER — Encounter: Payer: Self-pay | Admitting: Internal Medicine

## 2012-11-24 ENCOUNTER — Encounter: Payer: Self-pay | Admitting: *Deleted

## 2013-01-17 ENCOUNTER — Other Ambulatory Visit: Payer: Self-pay | Admitting: Family Medicine

## 2013-01-29 ENCOUNTER — Ambulatory Visit: Payer: 59 | Admitting: Internal Medicine

## 2013-02-06 ENCOUNTER — Encounter: Payer: Self-pay | Admitting: Internal Medicine

## 2013-02-06 ENCOUNTER — Ambulatory Visit (INDEPENDENT_AMBULATORY_CARE_PROVIDER_SITE_OTHER): Payer: 59 | Admitting: Internal Medicine

## 2013-02-06 VITALS — BP 110/80 | HR 68 | Ht 66.5 in | Wt 159.8 lb

## 2013-02-06 DIAGNOSIS — R1013 Epigastric pain: Secondary | ICD-10-CM

## 2013-02-06 NOTE — Progress Notes (Signed)
JAMISON YUHASZ [birth year] MRN [id number]        History of Present Illness:  This is a 52 year old white female who works at [employer]. We have seen her in the past for perforated diverticulitis resulting in the sigmoid resection in April 2013. She is here today because of epigastric pain which is only partially relieved with OTC Prilosec 20 mg twice a day. She has been on Aleve 2-4 tablets daily for knee injury which  has been a chronic problem. She is concerned about possibility of an ulcer. She denies melena ,nausea or vomiting. She has been trying to lose weight. There is no family history of gallbladder disease. She had a colonoscopy in 2004 which confirmed diverticulosis.   Past Medical History  Diagnosis Date  . Tachyarrhythmia 2006    s/p ablation  . Coronary artery disease     cardiologist- dr Sharlot Gowda taylor- last visit 2 yrs ago - no problems since ablation of tachpalpitations  . Congenital patella maltracking right knee  . Diverticulitis     perforated; required partial colectomy  . Arthritis   . Anxiety   . Pelvic abscess in female 11/28/11  . Pleural effusion, bilateral 11/28/11  . Perforated diverticulitis    Past Surgical History  Procedure Laterality Date  . Shoulder arthroscopy w/ rotator cuff repair Right 2010 repair bicep  . Left small fingernail removed  2006  . Cardiac electrophysiology study and ablation  2006    tachycardia  . Ectopic pregnancy surgery  1993  . Exploratory laparotomy  GYN fertility 1990's  . Hernia repair  1988  . Knee surgery Right 2012    lateral release  . Ganglion cyst excision Left 1997  . Pilonidal cyst excision  1983  . Rhinoplasty    . Lasik    . Partial colectomy  01/28/12    reports that she has been smoking Cigarettes.  She has been smoking about 0.00 packs per day. She has never used smokeless tobacco. She reports that she drinks about 3.6 ounces of alcohol per week. She reports that she does not use illicit  drugs. family history includes Heart failure in her mother; Stomach cancer in her paternal uncle; and Stroke in her mother. No Known Allergies      Review of Systems: Negative for dysphagia or odynophagia  The remainder of the 10 point ROS is negative except as outlined in H&P   Physical Exam: General appearance  Well developed, in no distress. Eyes- non icteric. HEENT nontraumatic, normocephalic. Mouth no lesions, tongue papillated, no cheilosis. Neck supple without adenopathy, thyroid not enlarged, no carotid bruits, no JVD. Lungs Clear to auscultation bilaterally. Cor normal S1, normal S2, regular rhythm, no murmur,  quiet precordium. Abdomen: Well-healed surgical scar. Normoactive bowel sounds. No distention. Tenderness in epigastrium and subxiphoid area. No fullness Rectal: Not done Extremities no pedal edema. Skin no lesions. Neurological alert and oriented x 3. Psychological normal mood and affect.  Assessment and Plan:  52 year old white female with persistent epigastric pain refractory to PPIs. She is on the large dose of Aleve which can lead to a gastropathy.. We will proceed with upper endoscopy to rule out gastropathy or ulcer. She will decrease her Aleve and will continue Prilosec 20 mg twice a day. Depending on the findings she may have to discontinue NSAID'sentirely. We will also consider increasing her Prilosec to 40 mg twice a day. We will plan to check her H. pylori antibody at the time of endoscopy   02/06/2013  Lina Sar

## 2013-02-06 NOTE — Patient Instructions (Addendum)
You have been scheduled for an endoscopy with propofol. Please follow written instructions given to you at your visit today. If you use inhalers (even only as needed), please bring them with you on the day of your procedure. Your physician has requested that you go to www.startemmi.com and enter the access code given to you at your visit today. This web site gives a general overview about your procedure. However, you should still follow specific instructions given to you by our office regarding your preparation for the procedure. CC:  Robert Bellow MD

## 2013-02-07 ENCOUNTER — Ambulatory Visit (AMBULATORY_SURGERY_CENTER): Payer: 59 | Admitting: Internal Medicine

## 2013-02-07 ENCOUNTER — Encounter: Payer: Self-pay | Admitting: Internal Medicine

## 2013-02-07 VITALS — BP 147/75 | HR 82 | Temp 97.7°F | Resp 19 | Ht 66.5 in | Wt 159.0 lb

## 2013-02-07 DIAGNOSIS — K319 Disease of stomach and duodenum, unspecified: Secondary | ICD-10-CM

## 2013-02-07 DIAGNOSIS — R1013 Epigastric pain: Secondary | ICD-10-CM

## 2013-02-07 DIAGNOSIS — K2990 Gastroduodenitis, unspecified, without bleeding: Secondary | ICD-10-CM

## 2013-02-07 DIAGNOSIS — K2970 Gastritis, unspecified, without bleeding: Secondary | ICD-10-CM

## 2013-02-07 MED ORDER — SODIUM CHLORIDE 0.9 % IV SOLN: 500.0000 mL | INTRAVENOUS | Status: DC

## 2013-02-07 NOTE — Op Note (Signed)
[facility] [address] [place], [address]   ENDOSCOPY PROCEDURE REPORT  PATIENT: Amanda [name], Amanda [name]  MR#: [id number] BIRTHDATE: October 02, 1960 , 52  yrs. old GENDER: Female ENDOSCOPIST: Hart Carwin, MD REFERRED BY:  Robert Bellow, M.D. PROCEDURE DATE:  02/07/2013 PROCEDURE:  EGD w/ biopsy ASA CLASS:     Class I INDICATIONS:  Epigastric pain.   worse with eating, has been on Aleve 2-4/day for knee pain. MEDICATIONS: MAC sedation, administered by CRNA and propofol (Diprivan) 250mg  IV TOPICAL ANESTHETIC: Cetacaine Spray  DESCRIPTION OF PROCEDURE: After the risks benefits and alternatives of the procedure were thoroughly explained, informed consent was obtained.  The LB [id number] endoscope was introduced through the mouth and advanced to the second portion of the duodenum. Without limitations.  The instrument was slowly withdrawn as the mucosa was fully examined.      esophagus: Endoscope passed through the posterior pharynx into the esophagus without difficulty. Proximal, mid and distal esophageal mucosa appeared normal. Squamocolumnar junction was normal. There was no evidence of hiatal hernia Stomach: Stomach was insufflated with air  showed normal rugal folds. Mucosa of the gastric  antrum was intensely erythematous and showed superficial erosions. There was no bleeding and no old blood in the stomach ,biopsies were taken from the gastric antrum, findings were consistent with gastritis.likely related to Aleve butH. pyloric gastropathy needs to be ruled out Duodenum:  duodenal bulb and descending duodenum was normal[ The scope was then withdrawn from the patient and the procedure completed.  COMPLICATIONS: There were no complications. ENDOSCOPIC IMPRESSION: antral gastritis most likely NSAID related. Status post biopsies RECOMMENDATIONS: 1.  Await pathology results 2.  Continue PPI 3. STOP Aleve, OK to take Tylenol 4. May use antacids as  needed for  gastric relief ( Gaviscon, Rolaids) 5. Bland food for several days   REPEAT EXAM: for EGD pending biopsy results.  eSigned:  Hart Carwin, MD 02/07/2013 4:15 PM   CC:  PATIENT NAME:  Anvika, Gashi MR#: [id number]

## 2013-02-07 NOTE — Progress Notes (Signed)
Called to room to assist during endoscopic procedure.  Patient ID and intended procedure confirmed with present staff. Received instructions for my participation in the procedure from the performing physician.  

## 2013-02-07 NOTE — Progress Notes (Signed)
Patient did not experience any of the following events: a burn prior to discharge; a fall within the facility; wrong site/side/patient/procedure/implant event; or a hospital transfer or hospital admission upon discharge from the facility. (G8907) Patient did not have preoperative order for IV antibiotic SSI prophylaxis. (G8918)  

## 2013-02-07 NOTE — Patient Instructions (Addendum)
Discharge instructions given with verbal understanding. Biopsies taken. Resume previous medications. YOU HAD AN ENDOSCOPIC PROCEDURE TODAY AT THE [facility]: Refer to the procedure report that was given to you for any specific questions about what was found during the examination.  If the procedure report does not answer your questions, please call your gastroenterologist to clarify.  If you requested that your care partner not be given the details of your procedure findings, then the procedure report has been included in a sealed envelope for you to review at your convenience later.  YOU SHOULD EXPECT: Some feelings of bloating in the abdomen. Passage of more gas than usual.  Walking can help get rid of the air that was put into your GI tract during the procedure and reduce the bloating. If you had a lower endoscopy (such as a colonoscopy or flexible sigmoidoscopy) you may notice spotting of blood in your stool or on the toilet paper. If you underwent a bowel prep for your procedure, then you may not have a normal bowel movement for a few days.  DIET: Your first meal following the procedure should be a light meal and then it is ok to progress to your normal diet.  A half-sandwich or bowl of soup is an example of a good first meal.  Heavy or fried foods are harder to digest and may make you feel nauseous or bloated.  Likewise meals heavy in dairy and vegetables can cause extra gas to form and this can also increase the bloating.  Drink plenty of fluids but you should avoid alcoholic beverages for 24 hours.  ACTIVITY: Your care partner should take you home directly after the procedure.  You should plan to take it easy, moving slowly for the rest of the day.  You can resume normal activity the day after the procedure however you should NOT DRIVE or use heavy machinery for 24 hours (because of the sedation medicines used during the test).    SYMPTOMS TO REPORT IMMEDIATELY: A gastroenterologist  can be reached at any hour.  During normal business hours, 8:30 AM to 5:00 PM Monday through Friday, call [phone].  After hours and on weekends, please call the GI answering service at [phone] who will take a message and have the physician on call contact you.   Following upper endoscopy (EGD)  Vomiting of blood or coffee ground material  New chest pain or pain under the shoulder blades  Painful or persistently difficult swallowing  New shortness of breath  Fever of 100F or higher  Black, tarry-looking stools  FOLLOW UP: If any biopsies were taken you will be contacted by phone or by letter within the next 1-3 weeks.  Call your gastroenterologist if you have not heard about the biopsies in 3 weeks.  Our staff will call the home number listed on your records the next business day following your procedure to check on you and address any questions or concerns that you may have at that time regarding the information given to you following your procedure. This is a courtesy call and so if there is no answer at the home number and we have not heard from you through the emergency physician on call, we will assume that you have returned to your regular daily activities without incident.  SIGNATURES/CONFIDENTIALITY: You and/or your care partner have signed paperwork which will be entered into your electronic medical record.  These signatures attest to the fact that that the information above on your After   Visit Summary has been reviewed and is understood.  Full responsibility of the confidentiality of this discharge information lies with you and/or your care-partner. 

## 2013-02-08 ENCOUNTER — Telehealth: Payer: Self-pay | Admitting: *Deleted

## 2013-02-08 NOTE — Telephone Encounter (Signed)
  Follow up Call-  Call back number 02/07/2013  Post procedure Call Back phone  # [phone]  Permission to leave phone message Yes     Patient questions:  Do you have a fever, pain , or abdominal swelling? no Pain Score  0 *  Have you tolerated food without any problems? yes  Have you been able to return to your normal activities? yes  Do you have any questions about your discharge instructions: Diet   no Medications  no Follow up visit  no  Do you have questions or concerns about your Care? no  Actions: * If pain score is 4 or above: No action needed, pain <4.

## 2013-02-12 ENCOUNTER — Encounter: Payer: Self-pay | Admitting: Internal Medicine

## 2013-02-27 ENCOUNTER — Encounter: Payer: Self-pay | Admitting: Internal Medicine

## 2013-06-19 ENCOUNTER — Other Ambulatory Visit (HOSPITAL_COMMUNITY): Payer: Self-pay | Admitting: Obstetrics & Gynecology

## 2013-06-19 DIAGNOSIS — Z1231 Encounter for screening mammogram for malignant neoplasm of breast: Secondary | ICD-10-CM

## 2013-06-29 ENCOUNTER — Ambulatory Visit (HOSPITAL_COMMUNITY)
Admission: RE | Admit: 2013-06-29 | Discharge: 2013-06-29 | Disposition: A | Discharge status: Home / Self Care | Payer: 59 | Source: Ambulatory Visit | Attending: Obstetrics & Gynecology | Admitting: Obstetrics & Gynecology

## 2013-06-29 DIAGNOSIS — Z1231 Encounter for screening mammogram for malignant neoplasm of breast: Secondary | ICD-10-CM | POA: Insufficient documentation

## 2013-09-14 ENCOUNTER — Ambulatory Visit (INDEPENDENT_AMBULATORY_CARE_PROVIDER_SITE_OTHER): Payer: 59 | Admitting: Family Medicine

## 2013-09-14 ENCOUNTER — Encounter: Payer: Self-pay | Admitting: Family Medicine

## 2013-09-14 VITALS — BP 174/103 | HR 89 | Temp 97.8°F | Resp 16 | Ht 66.5 in | Wt 159.6 lb

## 2013-09-14 DIAGNOSIS — R030 Elevated blood-pressure reading, without diagnosis of hypertension: Secondary | ICD-10-CM | POA: Insufficient documentation

## 2013-09-14 DIAGNOSIS — Z0000 Encounter for general adult medical examination without abnormal findings: Secondary | ICD-10-CM

## 2013-09-14 DIAGNOSIS — F17200 Nicotine dependence, unspecified, uncomplicated: Secondary | ICD-10-CM

## 2013-09-14 LAB — CBC WITH DIFFERENTIAL/PLATELET
Basophils Absolute: 0 10*3/uL (ref 0.0–0.1)
Basophils Relative: 0 % (ref 0–1)
Eosinophils Absolute: 0.1 10*3/uL (ref 0.0–0.7)
Eosinophils Relative: 1 % (ref 0–5)
HCT: 39.8 % (ref 36.0–46.0)
HEMOGLOBIN: 13.5 g/dL (ref 12.0–15.0)
LYMPHS ABS: 1.6 10*3/uL (ref 0.7–4.0)
LYMPHS PCT: 18 % (ref 12–46)
MCH: 31.5 pg (ref 26.0–34.0)
MCHC: 33.9 g/dL (ref 30.0–36.0)
MCV: 93 fL (ref 78.0–100.0)
Monocytes Absolute: 0.6 10*3/uL (ref 0.1–1.0)
Monocytes Relative: 7 % (ref 3–12)
NEUTROS PCT: 74 % (ref 43–77)
Neutro Abs: 6.4 10*3/uL (ref 1.7–7.7)
Platelets: 283 10*3/uL (ref 150–400)
RBC: 4.28 MIL/uL (ref 3.87–5.11)
RDW: 14.8 % (ref 11.5–15.5)
WBC: 8.6 10*3/uL (ref 4.0–10.5)

## 2013-09-14 LAB — HEPATITIS C ANTIBODY: HCV AB: NEGATIVE

## 2013-09-14 LAB — COMPLETE METABOLIC PANEL WITH GFR
ALT: 17 U/L (ref 0–35)
AST: 23 U/L (ref 0–37)
Albumin: 4.2 g/dL (ref 3.5–5.2)
Alkaline Phosphatase: 70 U/L (ref 39–117)
BILIRUBIN TOTAL: 0.9 mg/dL (ref 0.3–1.2)
BUN: 15 mg/dL (ref 6–23)
CO2: 28 mEq/L (ref 19–32)
Calcium: 9.2 mg/dL (ref 8.4–10.5)
Chloride: 99 mEq/L (ref 96–112)
Creat: 0.68 mg/dL (ref 0.50–1.10)
GFR, Est Non African American: >89 mL/min
Glucose, Bld: 86 mg/dL (ref 70–99)
Potassium: 4.4 mEq/L (ref 3.5–5.3)
Sodium: 137 mEq/L (ref 135–145)
Total Protein: 7.4 g/dL (ref 6.0–8.3)

## 2013-09-14 LAB — LIPID PANEL
Cholesterol: 187 mg/dL (ref 0–200)
HDL: 103 mg/dL (ref >39)
LDL CALC: 73 mg/dL (ref 0–99)
TRIGLYCERIDES: 54 mg/dL (ref <150)
Total CHOL/HDL Ratio: 1.8 Ratio
VLDL: 11 mg/dL (ref 0–40)

## 2013-09-14 LAB — TSH: TSH: 0.648 u[IU]/mL (ref 0.350–4.500)

## 2013-09-14 MED ORDER — METOPROLOL SUCCINATE ER 25 MG PO TB24: ORAL_TABLET | ORAL | Status: DC

## 2013-09-14 NOTE — Patient Instructions (Addendum)
Keep monitoring your blood pressure at [facility] about once a month.  Keeping You Healthy  Get These Tests  Blood Pressure- Have your blood pressure checked by your healthcare provider at least once a year.  Normal blood pressure is 120/80.  Weight- Have your body mass index (BMI) calculated to screen for obesity.  BMI is a measure of body fat based on height and weight.  You can calculate your own BMI at https://www.west-esparza.com/  Cholesterol- Have your cholesterol checked every year.  Diabetes- Have your blood sugar checked every year if you have high blood pressure, high cholesterol, a family history of diabetes or if you are overweight.  Pap Smear- Have a pap smear every 1 to 3 years if you have been sexually active.  If you are older than 65 and recent pap smears have been normal you may not need additional pap smears.  In addition, if you have had a hysterectomy  For benign disease additional pap smears are not necessary.  Mammogram-Yearly mammograms are essential for early detection of breast cancer  Screening for Colon Cancer- Colonoscopy starting at age 24. Screening may begin sooner depending on your family history and other health conditions.  Follow up colonoscopy as directed by your Gastroenterologist.  Screening for Osteoporosis- Screening begins at age 36 with bone density scanning, sooner if you are at higher risk for developing Osteoporosis.  Get these medicines  Calcium with Vitamin D- Your body requires 1200-1500 mg of Calcium a day and [phone] IU of Vitamin D a day.  You can only absorb 500 mg of Calcium at a time therefore Calcium must be taken in 2 or 3 separate doses throughout the day.  Hormones- Hormone therapy has been associated with increased risk for certain cancers and heart disease.  Talk to your healthcare provider about if you need relief from menopausal symptoms.  Aspirin- Ask your healthcare provider about taking Aspirin to prevent Heart Disease and  Stroke.  Get these Immuniztions  Flu shot- Every fall  Pneumonia shot- Once after the age of 73; if you are younger ask your healthcare provider if you need a pneumonia shot.  Tetanus- Every ten years.  Zostavax- Once after the age of 69 to prevent shingles.  Take these steps  Don't smoke- Your healthcare provider can help you quit. For tips on how to quit, ask your healthcare provider or go to www.smokefree.gov or call 1-800 QUIT-NOW.  Be physically active- Exercise 5 days a week for a minimum of 30 minutes.  If you are not already physically active, start slow and gradually work up to 30 minutes of moderate physical activity.  Try walking, dancing, bike riding, swimming, etc.  Eat a healthy diet- Eat a variety of healthy foods such as fruits, vegetables, whole grains, low fat milk, low fat cheeses, yogurt, lean meats, chicken, fish, eggs, dried beans, tofu, etc.  For more information go to www.thenutritionsource.org  Dental visit- Brush and floss teeth twice daily; visit your dentist twice a year.  Eye exam- Visit your Optometrist or Ophthalmologist yearly.  Drink alcohol in moderation- Limit alcohol intake to one drink or less a day.  Never drink and drive.  Depression- Your emotional health is as important as your physical health.  If you're feeling down or losing interest in things you normally enjoy, please talk to your healthcare provider.  Seat Belts- can save your life; always wear one  Smoke/Carbon Monoxide detectors- These detectors need to be installed on the appropriate level of your home.  Replace batteries at least once a year.  Violence- If anyone is threatening or hurting you, please tell your healthcare provider.  Living Will/ Health care power of attorney- Discuss with your healthcare provider and family.  Smoking Cessation Quitting smoking is important to your health and has many advantages. However, it is not always easy to quit since nicotine is a very  addictive drug. Often times, people try 3 times or more before being able to quit. This document explains the best ways for you to prepare to quit smoking. Quitting takes hard work and a lot of effort, but you can do it. ADVANTAGES OF QUITTING SMOKING  You will live longer, feel better, and live better.  Your body will feel the impact of quitting smoking almost immediately.  Within 20 minutes, blood pressure decreases. Your pulse returns to its normal level.  After 8 hours, carbon monoxide levels in the blood return to normal. Your oxygen level increases.  After 24 hours, the chance of having a heart attack starts to decrease. Your breath, hair, and body stop smelling like smoke.  After 48 hours, damaged nerve endings begin to recover. Your sense of taste and smell improve.  After 72 hours, the body is virtually free of nicotine. Your bronchial tubes relax and breathing becomes easier.  After 2 to 12 weeks, lungs can hold more air. Exercise becomes easier and circulation improves.  The risk of having a heart attack, stroke, cancer, or lung disease is greatly reduced.  After 1 year, the risk of coronary heart disease is cut in half.  After 5 years, the risk of stroke falls to the same as a nonsmoker.  After 10 years, the risk of lung cancer is cut in half and the risk of other cancers decreases significantly.  After 15 years, the risk of coronary heart disease drops, usually to the level of a nonsmoker.  If you are pregnant, quitting smoking will improve your chances of having a healthy baby.  The people you live with, especially any children, will be healthier.  You will have extra money to spend on things other than cigarettes. QUESTIONS TO THINK ABOUT BEFORE ATTEMPTING TO QUIT You may want to talk about your answers with your caregiver.  Why do you want to quit?  If you tried to quit in the past, what helped and what did not?  What will be the most difficult situations for  you after you quit? How will you plan to handle them?  Who can help you through the tough times? Your family? Friends? A caregiver?  What pleasures do you get from smoking? What ways can you still get pleasure if you quit? Here are some questions to ask your caregiver:  How can you help me to be successful at quitting?  What medicine do you think would be best for me and how should I take it?  What should I do if I need more help?  What is smoking withdrawal like? How can I get information on withdrawal? GET READY  Set a quit date.  Change your environment by getting rid of all cigarettes, ashtrays, matches, and lighters in your home, car, or work. Do not let people smoke in your home.  Review your past attempts to quit. Think about what worked and what did not. GET SUPPORT AND ENCOURAGEMENT You have a better chance of being successful if you have help. You can get support in many ways.  Tell your family, friends, and co-workers that you are  going to quit and need their support. Ask them not to smoke around you.  Get individual, group, or telephone counseling and support. Programs are available at [employer] and health centers. Call your local health department for information about programs in your area.  Spiritual beliefs and practices may help some smokers quit.  Download a "quit meter" on your computer to keep track of quit statistics, such as how long you have gone without smoking, cigarettes not smoked, and money saved.  Get a self-help book about quitting smoking and staying off of tobacco. [place] yourself from urges to smoke. Talk to someone, go for a walk, or occupy your time with a task.  Change your normal routine. Take a different route to work. Drink tea instead of coffee. Eat breakfast in a different place.  Reduce your stress. Take a hot bath, exercise, or read a book.  Plan something enjoyable to do every day. Reward  yourself for not smoking.  Explore interactive web-based programs that specialize in helping you quit. GET MEDICINE AND USE IT CORRECTLY Medicines can help you stop smoking and decrease the urge to smoke. Combining medicine with the above behavioral methods and support can greatly increase your chances of successfully quitting smoking.  Nicotine replacement therapy helps deliver nicotine to your body without the negative effects and risks of smoking. Nicotine replacement therapy includes nicotine gum, lozenges, inhalers, nasal sprays, and skin patches. Some may be available over-the-counter and others require a prescription.  Antidepressant medicine helps people abstain from smoking, but how this works is unknown. This medicine is available by prescription.  Nicotinic receptor partial agonist medicine simulates the effect of nicotine in your brain. This medicine is available by prescription. Ask your caregiver for advice about which medicines to use and how to use them based on your health history. Your caregiver will tell you what side effects to look out for if you choose to be on a medicine or therapy. Carefully read the information on the package. Do not use any other product containing nicotine while using a nicotine replacement product.  RELAPSE OR DIFFICULT SITUATIONS Most relapses occur within the first 3 months after quitting. Do not be discouraged if you start smoking again. Remember, most people try several times before finally quitting. You may have symptoms of withdrawal because your body is used to nicotine. You may crave cigarettes, be irritable, feel very hungry, cough often, get headaches, or have difficulty concentrating. The withdrawal symptoms are only temporary. They are strongest when you first quit, but they will go away within 10 14 days. To reduce the chances of relapse, try to:  Avoid drinking alcohol. Drinking lowers your chances of successfully quitting.  Reduce the  amount of caffeine you consume. Once you quit smoking, the amount of caffeine in your body increases and can give you symptoms, such as a rapid heartbeat, sweating, and anxiety.  Avoid smokers because they can make you want to smoke.  Do not let weight gain distract you. Many smokers will gain weight when they quit, usually less than 10 pounds. Eat a healthy diet and stay active. You can always lose the weight gained after you quit.  Find ways to improve your mood other than smoking. FOR MORE INFORMATION  www.smokefree.gov  Document Released: 08/24/2001 Document Revised: 02/29/2012 Document Reviewed: 12/09/2011 [facility] Patient Information 2014 [place], [place].

## 2013-09-14 NOTE — Progress Notes (Signed)
Subjective:    Patient ID: Amanda [name], female    DOB: 1961/02/09, 53 y.o.   MRN: [id number] Chief Complaint  Patient presents with  . Annual Exam   HPI  Checking BP outside of office - goes to [facility] every 2-3 wks to check and almost always runs 120s/80s.  Never >140/90.  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  Seen at [facility] annually - very distant h/o abnml paps - all nml in last 10 yrs - last done 12/2012 by Dr. Benjie Karvonen.  Last colonoscopy was 2004 but had partial colectomy 2013 due to ruptured diverticulitis. Just had upper endoscopy due to persistent gastritis sxs which resolved when she stopped using nsaids.  Knows she is due for her colonoscopy. Sees Dr. Olevia Perches - has not sched yet.  Now on pristiq instead of prozac - mood sxs good - followed by Dr. Toy Care.  Wants to quit smoking - just signed up to do the smoking cessation classes that [facility] offers!!! Past Medical History  Diagnosis Date  . Tachyarrhythmia 2006    s/p ablation  . Congenital patella maltracking right knee  . Diverticulitis     perforated; required partial colectomy  . Arthritis   . Anxiety   . Pelvic abscess in female 11/28/11  . Pleural effusion, bilateral 11/28/11  . Perforated diverticulitis   . Depression    Past Surgical History  Procedure Laterality Date  . Shoulder arthroscopy w/ rotator cuff repair Right 2010 repair bicep  . Left small fingernail removed  2006  . Cardiac electrophysiology study and ablation  2006    tachycardia  . Ectopic pregnancy surgery  1993  . Exploratory laparotomy  GYN fertility 1990's  . Hernia repair  1988  . Knee surgery Right 2012    lateral release  . Ganglion cyst excision Left 1997  . Pilonidal cyst excision  1983  . Rhinoplasty    . Lasik    . Partial colectomy  01/28/12  . Eye surgery    . Colon surgery     Current Outpatient Prescriptions on File Prior to Visit  Medication Sig Dispense Refill  . ALPRAZolam (XANAX) 1 MG  tablet Take 1 tablet (1 mg total) by mouth at bedtime as needed for sleep.  90 tablet  1  . glucosamine-chondroitin 500-400 MG tablet Take 1 tablet by mouth daily.       . Multiple Vitamin (MULTIVITAMIN) tablet Take 1 tablet by mouth daily.       . simethicone (MYLICON) 998 MG chewable tablet Chew 125 mg by mouth every 6 (six) hours as needed for flatulence.       No current facility-administered medications on file prior to visit.   No Known Allergies Family History  Problem Relation Age of Onset  . Heart failure Mother   . Stroke Mother   . Stomach cancer Paternal Uncle   . Stomach cancer Cousin   . Cancer Father 61    brain tumor   History   Social History  . Marital Status: Married    Spouse Name: N/A    Number of Children: N/A  . Years of Education: N/A   Social History Main Topics  . Smoking status: Current Some Day Smoker    Types: Cigarettes    Last Attempt to Quit: 11/27/2010  . Smokeless tobacco: Never Used  . Alcohol Use: 3.6 oz/week    6 Glasses of wine per week  . Drug Use: No  .  Sexual Activity: None   Other Topics Concern  . None   Social History Narrative  . None     Review of Systems  Constitutional: Negative.   HENT: Negative.   Eyes: Negative.   Respiratory: Negative.   Cardiovascular: Positive for palpitations.  Gastrointestinal: Negative.   Endocrine: Negative.   Genitourinary: Negative.   Musculoskeletal: Negative.   Skin: Negative.   Allergic/Immunologic: Negative.   Neurological: Negative.   Hematological: Negative.   Psychiatric/Behavioral: Negative.       BP 174/103  Pulse 89  Temp(Src) 97.8 F (36.6 C) (Oral)  Resp 16  Ht 5' 6.5" (1.689 m)  Wt 159 lb 9.6 oz (72.394 kg)  BMI 25.38 kg/m2  SpO2 99%  LMP 08/27/2013 Objective:   Physical Exam  Constitutional: She is oriented to person, place, and time. She appears well-developed and well-nourished. No distress.  HENT:  Head: Normocephalic and atraumatic.  Right Ear:  Tympanic membrane, external ear and ear canal normal.  Left Ear: Tympanic membrane, external ear and ear canal normal.  Nose: Nose normal. No mucosal edema or rhinorrhea.  Mouth/Throat: Uvula is midline, oropharynx is clear and moist and mucous membranes are normal. Oral lesions present. No trismus in the jaw. No uvula swelling. No oropharyngeal exudate, posterior oropharyngeal edema or posterior oropharyngeal erythema.    White-based shagged edge superficial ulceration on posterior right soft palate  Eyes: Conjunctivae and EOM are normal. Pupils are equal, round, and reactive to light. Right eye exhibits no discharge. Left eye exhibits no discharge. No scleral icterus.  Neck: Normal range of motion. Neck supple. No thyromegaly present.  Cardiovascular: Normal rate, regular rhythm, normal heart sounds and intact distal pulses.   Pulmonary/Chest: Effort normal and breath sounds normal. No respiratory distress.  Abdominal: Soft. Bowel sounds are normal. There is no tenderness.  Musculoskeletal: She exhibits no edema.  Lymphadenopathy:    She has no cervical adenopathy.  Neurological: She is alert and oriented to person, place, and time. She has normal reflexes.  Skin: Skin is warm and dry. She is not diaphoretic. No erythema.  Psychiatric: She has a normal mood and affect. Her behavior is normal.       Assessment & Plan:  Elevated blood pressure (not hypertension) - Plan: COMPLETE METABOLIC PANEL WITH GFR, CBC with Differential, Vit D  25 hydroxy (rtn osteoporosis monitoring) - white coat HTN - cont to monitor outside office.  Routine general medical examination at a health care facility - Plan: Lipid panel, COMPLETE METABOLIC PANEL WITH GFR, CBC with Differential, TSH, Vit D  25 hydroxy (rtn osteoporosis monitoring), Hepatitis C antibody - pt will contact Dr. Olevia Perches to schedule her colonoscopy.  Plans to cont annual eval at [facility] w/ Dr. Benjie Karvonen  Tobacco use disorder - Plan: COMPLETE  METABOLIC PANEL WITH GFR, CBC with Differential - starting smoking cessation classes at [facility]!!  Palpitations- toprol refilled.  Oral ulceration - advised mouthwash and monitor - if continues, could try nystatin but if still does not resolve rec ENT eval.  Meds ordered this encounter  Medications  . desvenlafaxine (PRISTIQ) 50 MG 24 hr tablet    Sig: Take 50 mg by mouth daily.  . metoprolol succinate (TOPROL-XL) 25 MG 24 hr tablet    Sig: TAKE 1 TABLET BY MOUTH ONCE DAILY IN THE EVENING    Dispense:  90 tablet    Refill:  4   Delman Cheadle, MD MPH

## 2013-09-15 ENCOUNTER — Encounter: Payer: Self-pay | Admitting: Family Medicine

## 2013-09-15 LAB — VITAMIN D 25 HYDROXY (VIT D DEFICIENCY, FRACTURES): Vit D, 25-Hydroxy: 30 ng/mL (ref 30–89)

## 2013-10-03 ENCOUNTER — Other Ambulatory Visit: Payer: Self-pay | Admitting: Physician Assistant

## 2013-10-17 ENCOUNTER — Ambulatory Visit (INDEPENDENT_AMBULATORY_CARE_PROVIDER_SITE_OTHER): Payer: 59 | Admitting: Emergency Medicine

## 2013-10-17 VITALS — BP 144/90 | HR 94 | Temp 97.9°F | Resp 17 | Ht 66.5 in | Wt 162.0 lb

## 2013-10-17 DIAGNOSIS — J029 Acute pharyngitis, unspecified: Secondary | ICD-10-CM

## 2013-10-17 LAB — POCT RAPID STREP A (OFFICE): Rapid Strep A Screen: NEGATIVE

## 2013-10-17 LAB — POCT SKIN KOH: Skin KOH, POC: NEGATIVE

## 2013-10-17 MED ORDER — FIRST-DUKES MOUTHWASH MT SUSP: OROMUCOSAL | Status: DC

## 2013-10-17 NOTE — Progress Notes (Signed)
Subjective:  This chart was scribed for Remo Lipps A. Everlene Farrier, MD by Eugenia Mcalpine, ED Scribe. This patient was seen in room 12 and the patient's care was started at 12:35 PM.   Patient ID: Amanda [name], female    DOB: 03/03/1961, 53 y.o.   MRN: [id number]  Sore Throat  Associated symptoms include trouble swallowing. Pertinent negatives include no congestion or vomiting.   HPI Comments: Amanda [name] is a 53 y.o. female who presents to the Emergency Department complaining of a white spot at the back of her throat that she was seen for 1 month ago. She was advised to gargle with salt water and watch the spot with no relief.  She states that it makes it difficult to swallow and there is some drainage.  She felt that she had sinus drainage and post nasal drip this morning.  Patient Active Problem List   Diagnosis Date Noted  . Elevated blood pressure (not hypertension) 09/14/2013  . Intestinal diverticular abscess 11/28/2011   Past Medical History  Diagnosis Date  . Tachyarrhythmia 2006    s/p ablation  . Congenital patella maltracking right knee  . Diverticulitis     perforated; required partial colectomy  . Arthritis   . Anxiety   . Pelvic abscess in female 11/28/11  . Pleural effusion, bilateral 11/28/11  . Perforated diverticulitis   . Depression    Past Surgical History  Procedure Laterality Date  . Shoulder arthroscopy w/ rotator cuff repair Right 2010 repair bicep  . Left small fingernail removed  2006  . Cardiac electrophysiology study and ablation  2006    tachycardia  . Ectopic pregnancy surgery  1993  . Exploratory laparotomy  GYN fertility 1990's  . Hernia repair  1988  . Knee surgery Right 2012    lateral release  . Ganglion cyst excision Left 1997  . Pilonidal cyst excision  1983  . Rhinoplasty    . Lasik    . Partial colectomy  01/28/12  . Eye surgery    . Colon surgery     No Known Allergies Prior to Admission medications   Medication Sig Start Date End  Date Taking? Authorizing Provider  ALPRAZolam Duanne Moron) 1 MG tablet Take 1 tablet (1 mg total) by mouth at bedtime as needed for sleep. 12/20/11  Yes Elige Radon, MD  desvenlafaxine (PRISTIQ) 50 MG 24 hr tablet Take 50 mg by mouth daily.   Yes Historical Provider, MD  glucosamine-chondroitin 500-400 MG tablet Take 1 tablet by mouth daily.    Yes Historical Provider, MD  metoprolol succinate (TOPROL-XL) 25 MG 24 hr tablet TAKE 1 TABLET BY MOUTH ONCE DAILY IN THE EVENING 09/14/13  Yes Shawnee Knapp, MD  Multiple Vitamin (MULTIVITAMIN) tablet Take 1 tablet by mouth daily.    Yes Historical Provider, MD  simethicone (MYLICON) 009 MG chewable tablet Chew 125 mg by mouth every 6 (six) hours as needed for flatulence.   Yes Historical Provider, MD   History   Social History  . Marital Status: Married    Spouse Name: N/A    Number of Children: N/A  . Years of Education: N/A   Occupational History  . Not on file.   Social History Main Topics  . Smoking status: Current Some Day Smoker -- 0.50 packs/day for 20 years    Types: Cigarettes    Last Attempt to Quit: 11/27/2010  . Smokeless tobacco: Never Used  . Alcohol Use: 3.6 oz/week    6 Glasses  of wine per week  . Drug Use: No  . Sexual Activity: Not on file   Other Topics Concern  . Not on file   Social History Narrative  . No narrative on file      Review of Systems  Constitutional: Negative for fever.  HENT: Positive for postnasal drip and trouble swallowing. Negative for congestion and rhinorrhea.   Gastrointestinal: Negative for nausea and vomiting.       Objective:   Physical Exam  HENT:  Head: Normocephalic.  Right Ear: Tympanic membrane normal.  Left Ear: Tympanic membrane normal.  Nose: No mucosal edema.  Mouth/Throat: No uvula swelling. Posterior oropharyngeal erythema present. No oropharyngeal exudate or tonsillar abscesses.  No nodes present  Neck: Neck supple. No thyromegaly present.    Filed Vitals:   10/17/13  1128  BP: 144/90  Pulse: 94  Temp: 97.9 F (36.6 C)  TempSrc: Oral  Resp: 17  Height: 5' 6.5" (1.689 m)  Weight: 162 lb (73.483 kg)  SpO2: 99%   Results for orders placed in visit on 10/17/13  POCT RAPID STREP A (OFFICE)      Result Value Range   Rapid Strep A Screen Negative  Negative  POCT SKIN KOH      Result Value Range   Skin KOH, POC Negative           Assessment & Plan:  Culture was done we'll treat with [facility] mouthwash.

## 2013-10-19 LAB — CULTURE, GROUP A STREP: ORGANISM ID, BACTERIA: NORMAL

## 2013-11-05 ENCOUNTER — Emergency Department (HOSPITAL_COMMUNITY)
Admission: EM | Admit: 2013-11-05 | Discharge: 2013-11-05 | Disposition: A | Discharge status: Home / Self Care | Payer: 59 | Attending: Emergency Medicine | Admitting: Emergency Medicine

## 2013-11-05 ENCOUNTER — Encounter (HOSPITAL_COMMUNITY): Payer: Self-pay | Admitting: Emergency Medicine

## 2013-11-05 ENCOUNTER — Emergency Department (HOSPITAL_COMMUNITY): Payer: 59

## 2013-11-05 DIAGNOSIS — R1013 Epigastric pain: Secondary | ICD-10-CM | POA: Insufficient documentation

## 2013-11-05 DIAGNOSIS — R1110 Vomiting, unspecified: Secondary | ICD-10-CM

## 2013-11-05 DIAGNOSIS — R197 Diarrhea, unspecified: Secondary | ICD-10-CM | POA: Insufficient documentation

## 2013-11-05 DIAGNOSIS — R112 Nausea with vomiting, unspecified: Secondary | ICD-10-CM | POA: Insufficient documentation

## 2013-11-05 DIAGNOSIS — Z9089 Acquired absence of other organs: Secondary | ICD-10-CM | POA: Insufficient documentation

## 2013-11-05 DIAGNOSIS — Z8742 Personal history of other diseases of the female genital tract: Secondary | ICD-10-CM | POA: Insufficient documentation

## 2013-11-05 DIAGNOSIS — F3289 Other specified depressive episodes: Secondary | ICD-10-CM | POA: Insufficient documentation

## 2013-11-05 DIAGNOSIS — R000 Tachycardia, unspecified: Secondary | ICD-10-CM | POA: Insufficient documentation

## 2013-11-05 DIAGNOSIS — Z87768 Personal history of other specified (corrected) congenital malformations of integument, limbs and musculoskeletal system: Secondary | ICD-10-CM | POA: Insufficient documentation

## 2013-11-05 DIAGNOSIS — F17200 Nicotine dependence, unspecified, uncomplicated: Secondary | ICD-10-CM | POA: Insufficient documentation

## 2013-11-05 DIAGNOSIS — M129 Arthropathy, unspecified: Secondary | ICD-10-CM | POA: Insufficient documentation

## 2013-11-05 DIAGNOSIS — Z9889 Other specified postprocedural states: Secondary | ICD-10-CM | POA: Insufficient documentation

## 2013-11-05 DIAGNOSIS — Z8709 Personal history of other diseases of the respiratory system: Secondary | ICD-10-CM | POA: Insufficient documentation

## 2013-11-05 DIAGNOSIS — Z8776 Personal history of (corrected) congenital malformations of integument, limbs and musculoskeletal system: Secondary | ICD-10-CM | POA: Insufficient documentation

## 2013-11-05 DIAGNOSIS — Z79899 Other long term (current) drug therapy: Secondary | ICD-10-CM | POA: Insufficient documentation

## 2013-11-05 DIAGNOSIS — F411 Generalized anxiety disorder: Secondary | ICD-10-CM | POA: Insufficient documentation

## 2013-11-05 DIAGNOSIS — F329 Major depressive disorder, single episode, unspecified: Secondary | ICD-10-CM | POA: Insufficient documentation

## 2013-11-05 LAB — CBC WITH DIFFERENTIAL/PLATELET
Basophils Absolute: 0 10*3/uL (ref 0.0–0.1)
Basophils Relative: 0 % (ref 0–1)
Eosinophils Absolute: 0.1 10*3/uL (ref 0.0–0.7)
Eosinophils Relative: 2 % (ref 0–5)
HCT: 41 % (ref 36.0–46.0)
HEMOGLOBIN: 14.1 g/dL (ref 12.0–15.0)
LYMPHS ABS: 0.9 10*3/uL (ref 0.7–4.0)
Lymphocytes Relative: 17 % (ref 12–46)
MCH: 32.9 pg (ref 26.0–34.0)
MCHC: 34.4 g/dL (ref 30.0–36.0)
MCV: 95.6 fL (ref 78.0–100.0)
MONOS PCT: 15 % — AB (ref 3–12)
Monocytes Absolute: 0.8 10*3/uL (ref 0.1–1.0)
NEUTROS ABS: 3.6 10*3/uL (ref 1.7–7.7)
Neutrophils Relative %: 66 % (ref 43–77)
Platelets: 253 10*3/uL (ref 150–400)
RBC: 4.29 MIL/uL (ref 3.87–5.11)
RDW: 14.7 % (ref 11.5–15.5)
WBC: 5.5 10*3/uL (ref 4.0–10.5)

## 2013-11-05 LAB — COMPREHENSIVE METABOLIC PANEL
ALBUMIN: 3.2 g/dL — AB (ref 3.5–5.2)
ALK PHOS: 42 U/L (ref 39–117)
ALT: 18 U/L (ref 0–35)
AST: 25 U/L (ref 0–37)
BUN: 11 mg/dL (ref 6–23)
CO2: 20 mEq/L (ref 19–32)
CREATININE: 0.79 mg/dL (ref 0.50–1.10)
Calcium: 8 mg/dL — ABNORMAL LOW (ref 8.4–10.5)
Chloride: 105 mEq/L (ref 96–112)
GFR calc non Af Amer: >90 mL/min (ref >90)
GLUCOSE: 92 mg/dL (ref 70–99)
POTASSIUM: 3.6 meq/L — AB (ref 3.7–5.3)
Sodium: 139 mEq/L (ref 137–147)
TOTAL PROTEIN: 6.6 g/dL (ref 6.0–8.3)
Total Bilirubin: <0.2 mg/dL — ABNORMAL LOW (ref 0.3–1.2)

## 2013-11-05 LAB — URINALYSIS, ROUTINE W REFLEX MICROSCOPIC
Glucose, UA: NEGATIVE mg/dL
Ketones, ur: NEGATIVE mg/dL
Leukocytes, UA: NEGATIVE
Nitrite: NEGATIVE
Protein, ur: 100 mg/dL — AB
SPECIFIC GRAVITY, URINE: 1.028 (ref 1.005–1.030)
UROBILINOGEN UA: 0.2 mg/dL (ref 0.0–1.0)
pH: 6 (ref 5.0–8.0)

## 2013-11-05 LAB — URINE MICROSCOPIC-ADD ON

## 2013-11-05 LAB — LIPASE, BLOOD: LIPASE: 158 U/L — AB (ref 11–59)

## 2013-11-05 MED ORDER — IOHEXOL 300 MG/ML  SOLN
50.0000 mL | Freq: Once | INTRAMUSCULAR | Status: AC | PRN
  Administered 2013-11-05: 50 mL via ORAL

## 2013-11-05 MED ORDER — METOCLOPRAMIDE HCL 5 MG/ML IJ SOLN
10.0000 mg | Freq: Once | INTRAMUSCULAR | Status: AC
  Administered 2013-11-05: 10 mg via INTRAVENOUS
  Filled 2013-11-05: qty 2

## 2013-11-05 MED ORDER — SODIUM CHLORIDE 0.9 % IV BOLUS (SEPSIS)
1000.0000 mL | Freq: Once | INTRAVENOUS | Status: AC
  Administered 2013-11-05: 1000 mL via INTRAVENOUS

## 2013-11-05 MED ORDER — MORPHINE SULFATE 4 MG/ML IJ SOLN
4.0000 mg | Freq: Once | INTRAMUSCULAR | Status: AC
  Administered 2013-11-05: 4 mg via INTRAVENOUS
  Filled 2013-11-05: qty 1

## 2013-11-05 MED ORDER — ONDANSETRON 4 MG PO TBDP: ORAL_TABLET | ORAL | Status: DC

## 2013-11-05 MED ORDER — IOHEXOL 300 MG/ML  SOLN
100.0000 mL | Freq: Once | INTRAMUSCULAR | Status: AC | PRN
  Administered 2013-11-05: 100 mL via INTRAVENOUS

## 2013-11-05 NOTE — Discharge Instructions (Signed)
Take zofran 4 mg as needed for nausea.   Take tylenol or motrin for pain.   Follow up with your doctor.   Return to ER if you have fever, severe pain, worse dehydration.    Diet for Diarrhea, Adult Frequent, runny stools (diarrhea) may be caused or worsened by food or drink. Diarrhea may be relieved by changing your diet. Since diarrhea can last up to 7 days, it is easy for you to lose too much fluid from the body and become dehydrated. Fluids that are lost need to be replaced. Along with a modified diet, make sure you drink enough fluids to keep your urine clear or pale yellow. DIET INSTRUCTIONS  Ensure adequate fluid intake (hydration): have 1 cup (8 oz) of fluid for each diarrhea episode. Avoid fluids that contain simple sugars or sports drinks, fruit juices, whole milk products, and sodas. Your urine should be clear or pale yellow if you are drinking enough fluids. Hydrate with an oral rehydration solution that you can purchase at pharmacies, retail stores, and online. You can prepare an oral rehydration solution at home by mixing the following ingredients together:    tsp table salt.   tsp baking soda.   tsp salt substitute containing potassium chloride.  1  tablespoons sugar.  1 L (34 oz) of water.  Certain foods and beverages may increase the speed at which food moves through the gastrointestinal (GI) tract. These foods and beverages should be avoided and include:  Caffeinated and alcoholic beverages.  High-fiber foods, such as raw fruits and vegetables, nuts, seeds, and whole grain breads and cereals.  Foods and beverages sweetened with sugar alcohols, such as xylitol, sorbitol, and mannitol.  Some foods may be well tolerated and may help thicken stool including:  Starchy foods, such as rice, toast, pasta, low-sugar cereal, oatmeal, grits, baked potatoes, crackers, and bagels.   Bananas.   Applesauce.  Add probiotic-rich foods to help increase healthy bacteria in  the GI tract, such as yogurt and fermented milk products. RECOMMENDED FOODS AND BEVERAGES Starches Choose foods with less than 2 g of fiber per serving.  Recommended:  White, [place], and pita breads, plain rolls, buns, bagels. Plain muffins, matzo. Soda, saltine, or graham crackers. Pretzels, melba toast, zwieback. Cooked cereals made with water: cornmeal, farina, cream cereals. Dry cereals: refined corn, wheat, rice. Potatoes prepared any way without skins, refined macaroni, spaghetti, noodles, refined rice.  Avoid:  Bread, rolls, or crackers made with whole wheat, multi-grains, rye, bran seeds, nuts, or coconut. Corn tortillas or taco shells. Cereals containing whole grains, multi-grains, bran, coconut, nuts, raisins. Cooked or dry oatmeal. Coarse wheat cereals, granola. Cereals advertised as "high-fiber." Potato skins. Whole grain pasta, wild or brown rice. Popcorn. Sweet potatoes, yams. Sweet rolls, doughnuts, waffles, pancakes, sweet breads. Vegetables  Recommended: Strained tomato and vegetable juices. Most well-cooked and canned vegetables without seeds. Fresh: Tender lettuce, cucumber without the skin, cabbage, spinach, bean sprouts.  Avoid: Fresh, cooked, or canned: Artichokes, baked beans, beet greens, broccoli, Brussels sprouts, corn, kale, legumes, peas, sweet potatoes. Cooked: Green or red cabbage, spinach. Avoid large servings of any vegetables because vegetables shrink when cooked, and they contain more fiber per serving than fresh vegetables. Fruit  Recommended: Cooked or canned: Apricots, applesauce, cantaloupe, cherries, fruit cocktail, grapefruit, grapes, kiwi, mandarin oranges, peaches, pears, plums, watermelon. Fresh: Apples without skin, ripe banana, grapes, cantaloupe, cherries, grapefruit, peaches, oranges, plums. Keep servings limited to  cup or 1 piece.  Avoid: Fresh: Apples with skin, apricots,  mangoes, pears, raspberries, strawberries. Prune juice, stewed or dried  prunes. Dried fruits, raisins, dates. Large servings of all fresh fruits. Protein  Recommended: Ground or well-cooked tender beef, ham, veal, lamb, pork, or poultry. Eggs. Fish, oysters, shrimp, lobster, other seafoods. Liver, organ meats.  Avoid: Tough, fibrous meats with gristle. Peanut butter, smooth or chunky. Cheese, nuts, seeds, legumes, dried peas, beans, lentils. Dairy  Recommended: Yogurt, lactose-free milk, kefir, drinkable yogurt, buttermilk, soy milk, or plain hard cheese.  Avoid: Milk, chocolate milk, beverages made with milk, such as milkshakes. Soups  Recommended: Bouillon, broth, or soups made from allowed foods. Any strained soup.  Avoid: Soups made from vegetables that are not allowed, cream or milk-based soups. Desserts and Sweets  Recommended: Sugar-free gelatin, sugar-free frozen ice pops made without sugar alcohol.  Avoid: Plain cakes and cookies, pie made with fruit, pudding, custard, cream pie. Gelatin, fruit, ice, sherbet, frozen ice pops. Ice cream, ice milk without nuts. Plain hard candy, honey, jelly, molasses, syrup, sugar, chocolate syrup, gumdrops, marshmallows. Fats and Oils  Recommended: Limit fats to less than 8 tsp per day.  Avoid: Seeds, nuts, olives, avocados. Margarine, butter, cream, mayonnaise, salad oils, plain salad dressings. Plain gravy, crisp bacon without rind. Beverages  Recommended: Water, decaffeinated teas, oral rehydration solutions, sugar-free beverages not sweetened with sugar alcohols.  Avoid: Fruit juices, caffeinated beverages (coffee, tea, soda), alcohol, sports drinks, or lemon-lime soda. Condiments  Recommended: Ketchup, mustard, horseradish, vinegar, cocoa powder. Spices in moderation: allspice, basil, bay leaves, celery powder or leaves, cinnamon, cumin powder, curry powder, ginger, mace, marjoram, onion or garlic powder, oregano, paprika, parsley flakes, ground pepper, rosemary, sage, savory, tarragon, thyme,  turmeric.  Avoid: Coconut, honey. Document Released: 11/20/2003 Document Revised: 05/24/2012 Document Reviewed: 01/14/2012 [facility] Patient Information 2014 [place].

## 2013-11-05 NOTE — ED Provider Notes (Signed)
CSN: [id number]     Arrival date & time 11/05/13  0932 History   First MD Initiated Contact with Patient 11/05/13 0932     Chief Complaint  Patient presents with  . Abdominal Pain  . Emesis  . Diarrhea     (Consider location/radiation/quality/duration/timing/severity/associated sxs/prior Treatment) The history is provided by the patient.  Amanda [name] is a 53 y.o. female hx of diverticulitis s/p colectomy here with vomiting, diarrhea. Vomiting 3 days ago that stopped but has persistent nausea. She is able to tolerate some food today but still feels nauseous. She also had several episodes of diarrhea. Denies any fevers. She has some epigastric pain but denies any lower abdominal pain.    Past Medical History  Diagnosis Date  . Tachyarrhythmia 2006    s/p ablation  . Congenital patella maltracking right knee  . Diverticulitis     perforated; required partial colectomy  . Arthritis   . Anxiety   . Pelvic abscess in female 11/28/11  . Pleural effusion, bilateral 11/28/11  . Perforated diverticulitis   . Depression    Past Surgical History  Procedure Laterality Date  . Shoulder arthroscopy w/ rotator cuff repair Right 2010 repair bicep  . Left small fingernail removed  2006  . Cardiac electrophysiology study and ablation  2006    tachycardia  . Ectopic pregnancy surgery  1993  . Exploratory laparotomy  GYN fertility 1990's  . Hernia repair  1988  . Knee surgery Right 2012    lateral release  . Ganglion cyst excision Left 1997  . Pilonidal cyst excision  1983  . Rhinoplasty    . Lasik    . Partial colectomy  01/28/12  . Eye surgery    . Colon surgery     Family History  Problem Relation Age of Onset  . Heart failure Mother   . Stroke Mother   . Stomach cancer Paternal Uncle   . Stomach cancer Cousin   . Cancer Father 73    brain tumor   History  Substance Use Topics  . Smoking status: Current Some Day Smoker -- 0.50 packs/day for 20 years    Types: Cigarettes     Last Attempt to Quit: 11/27/2010  . Smokeless tobacco: Never Used  . Alcohol Use: 3.6 oz/week    6 Glasses of wine per week   OB History   Grav Para Term Preterm Abortions TAB SAB Ect Mult Living                 Review of Systems  Gastrointestinal: Positive for vomiting, abdominal pain and diarrhea.  All other systems reviewed and are negative.      Allergies  Review of patient's allergies indicates no known allergies.  Home Medications   Current Outpatient Rx  Name  Route  Sig  Dispense  Refill  . ALPRAZolam (XANAX) 1 MG tablet   Oral   Take 1 tablet (1 mg total) by mouth at bedtime as needed for sleep.   90 tablet   1   . bismuth subsalicylate (PEPTO BISMOL) 262 MG/15ML suspension   Oral   Take 30 mLs by mouth every 6 (six) hours as needed.         Marland Kitchen glucosamine-chondroitin 500-400 MG tablet   Oral   Take 1 tablet by mouth daily.          Marland Kitchen HYDROcodone-acetaminophen (NORCO/VICODIN) 5-325 MG per tablet   Oral   Take 1 tablet by mouth every 6 (six) hours  as needed for moderate pain.         Marland Kitchen loperamide (IMODIUM) 2 MG capsule   Oral   Take 4 mg by mouth as needed for diarrhea or loose stools.         . metoprolol tartrate (LOPRESSOR) 25 MG tablet   Oral   Take 25 mg by mouth daily.         . Multiple Vitamin (MULTIVITAMIN) tablet   Oral   Take 1 tablet by mouth daily.          . norethindrone (AYGESTIN) 5 MG tablet   Oral   Take 5 mg by mouth daily. Took for 10 days         . simethicone (MYLICON) 517 MG chewable tablet   Oral   Chew 125 mg by mouth every 6 (six) hours as needed for flatulence.          BP 139/85  Pulse 106  Temp(Src) 98.5 F (36.9 C) (Oral)  Resp 16  SpO2 98%  LMP 08/27/2013 Physical Exam  Nursing note and vitals reviewed. Constitutional: She is oriented to person, place, and time.  Uncomfortable   HENT:  Head: Normocephalic.  MM slightly dry   Eyes: Conjunctivae are normal. Pupils are equal, round,  and reactive to light.  Neck: Normal range of motion. Neck supple.  Cardiovascular: Regular rhythm and normal heart sounds.   Slightly tachycardic   Pulmonary/Chest: Effort normal and breath sounds normal. No respiratory distress. She has no wheezes. She has no rales.  Abdominal: Soft. Bowel sounds are normal.  Mild epigastric tenderness, no rebound   Musculoskeletal: Normal range of motion.  Neurological: She is alert and oriented to person, place, and time. No cranial nerve deficit. Coordination normal.  Skin: Skin is warm and dry.  Psychiatric: She has a normal mood and affect. Her behavior is normal. Judgment and thought content normal.    ED Course  Procedures (including critical care time) Labs Review Labs Reviewed  CBC WITH DIFFERENTIAL - Abnormal; Notable for the following:    Monocytes Relative 15 (*)    All other components within normal limits  URINALYSIS, ROUTINE W REFLEX MICROSCOPIC - Abnormal; Notable for the following:    Color, Urine AMBER (*)    APPearance CLOUDY (*)    Hgb urine dipstick TRACE (*)    Bilirubin Urine SMALL (*)    Protein, ur 100 (*)    All other components within normal limits  COMPREHENSIVE METABOLIC PANEL - Abnormal; Notable for the following:    Potassium 3.6 (*)    Calcium 8.0 (*)    Albumin 3.2 (*)    Total Bilirubin <0.2 (*)    All other components within normal limits  LIPASE, BLOOD - Abnormal; Notable for the following:    Lipase 158 (*)    All other components within normal limits  URINE MICROSCOPIC-ADD ON - Abnormal; Notable for the following:    Squamous Epithelial / LPF MANY (*)    Bacteria, UA MANY (*)    Casts HYALINE CASTS (*)    All other components within normal limits   Imaging Review Ct Abdomen Pelvis W Contrast  11/05/2013   CLINICAL DATA:  Epigastric pain, vomiting, diarrhea, history of diverticulitis  EXAM: CT ABDOMEN AND PELVIS WITH CONTRAST  TECHNIQUE: Multidetector CT imaging of the abdomen and pelvis was performed  using the standard protocol following bolus administration of intravenous contrast.  CONTRAST:  170mL OMNIPAQUE IOHEXOL 300 MG/ML  SOLN  COMPARISON:  12/03/2011  FINDINGS: Mild degenerative changes lumbar spine.  Lung bases are unremarkable. No calcified gallstones are noted within gallbladder. Liver, pancreas, spleen and adrenal glands are unremarkable.  Her  Kidneys are symmetrical in size and enhancement. No hydronephrosis or hydroureter.  No focal renal mass. Scattered diverticula are noted descending colon and sigmoid colon. No evidence of acute diverticulitis. No evidence of colitis.  Delayed renal images shows bilateral renal symmetrical excretion. Bilateral visualized proximal ureter is unremarkable.  No small bowel obstruction.  No ascites or free air.  No adenopathy.  Small amount of pelvic free fluid is noted. Mild the retroflexed uterus. Nonspecific mild congested uterine vessels and nonspecific mild enhancement of the myometrium.  No adnexal masses noted. The urinary bladder is unremarkable. Mild congested left adnexal vessels.  There is no pericecal inflammation. The terminal ileum is unremarkable.  IMPRESSION: 1. No acute inflammatory process within abdomen. No small bowel obstruction. 2. Mild retroflexed uterus. Mild congested uteral vessels and nonspecific mild enhancement of the myometrium. Small amount of pelvic free fluid within posterior cul-de-sac. 3. No hydronephrosis or hydroureter. 4. No pericecal inflammation. 5. Scattered diverticula are noted descending colon and sigmoid colon. No evidence of acute diverticulitis or colitis.   Electronically Signed   By: Lahoma Crocker M.D.   On: 11/05/2013 13:17   Dg Abd Acute W/chest  11/05/2013   CLINICAL DATA:  53 year old female with epigastric pain nausea vomiting. Initial encounter.  EXAM: ACUTE ABDOMEN SERIES (ABDOMEN 2 VIEW & CHEST 1 VIEW)  COMPARISON:  CT Abdomen and Pelvis 12/15/2011 and earlier.  FINDINGS: Lung volumes appear normal. Normal  cardiac size and mediastinal contours. Visualized tracheal air column is within normal limits. No pneumothorax or pneumoperitoneum. Stable small circular sclerotic focus in the proximal right humerus since 2013, benign.  Non obstructed bowel gas pattern. Abdominal and pelvic visceral contours are within normal limits. Small chronic pelvic phleboliths. No acute osseous abnormality identified.  IMPRESSION: 1.  Non obstructed bowel gas pattern, no free air. 2.  No acute cardiopulmonary abnormality.   Electronically Signed   By: Lars Pinks M.D.   On: 11/05/2013 10:30    EKG Interpretation    Date/Time:  Monday November 05 2013 09:33:33 EST Ventricular Rate:  93 PR Interval:  156 QRS Duration: 72 QT Interval:  328 QTC Calculation: 408 R Axis:   61 Text Interpretation:  Sinus rhythm nonspecific changes No significant change since last tracing Confirmed by Gorden Stthomas  MD, Mikaia Janvier [phone]) on 11/05/2013 9:41:58 AM            MDM   Final diagnoses:  None  SHON HEADD is a 53 y.o. female here with epigastric pain, vomiting, diarrhea. Likely gastroenteritis. Given colectomy in the past, will get xray to r/o SBO. Will give zofran and hydrate.   11 AM Lipase 158. Still in pain. Will do ct to r/o pancreatitis.  2:13 PM Felt better. Tolerated food. Likely gastro. CT showed no pancreatitis. I think elevated lipase likely from gastro, not true pancreatitis. Recommend prn zofran at home.    Wandra Arthurs, MD 11/05/13 [phone]

## 2013-11-05 NOTE — ED Notes (Signed)
Pt c/o upper abdominal/epigastric pain, emesis, and diarrhea x 2 days.  Pain score 7/10.  Pt reports Hx of diverticulitis and sts "it feels like it did the last time I had a flare up."

## 2013-11-05 NOTE — ED Notes (Signed)
I tried to draw labs on patient put was not successful made I made the nurse aware

## 2013-11-05 NOTE — ED Notes (Signed)
Patient transported to CT 

## 2013-11-05 NOTE — ED Notes (Signed)
MD at bedside. 

## 2013-11-22 ENCOUNTER — Encounter: Payer: Self-pay | Admitting: Internal Medicine

## 2014-01-04 ENCOUNTER — Ambulatory Visit (AMBULATORY_SURGERY_CENTER): Payer: Self-pay | Admitting: *Deleted

## 2014-01-04 VITALS — Ht 66.0 in | Wt 160.4 lb

## 2014-01-04 DIAGNOSIS — Z1211 Encounter for screening for malignant neoplasm of colon: Secondary | ICD-10-CM

## 2014-01-04 MED ORDER — MOVIPREP 100 G PO SOLR: 1.0000 | Freq: Once | ORAL | Status: DC

## 2014-01-04 NOTE — Progress Notes (Signed)
Denies allergies to eggs or soy products. Denies complications with sedation or anesthesia. Denies O2 use. Denies use of diet or weight loss medications.  Emmi instructions given for colonoscopy.  

## 2014-01-18 ENCOUNTER — Ambulatory Visit (AMBULATORY_SURGERY_CENTER): Payer: 59 | Admitting: Internal Medicine

## 2014-01-18 ENCOUNTER — Encounter: Payer: Self-pay | Admitting: Internal Medicine

## 2014-01-18 VITALS — BP 155/92 | HR 91 | Temp 97.6°F | Resp 26 | Ht 66.0 in | Wt 160.0 lb

## 2014-01-18 DIAGNOSIS — Z1211 Encounter for screening for malignant neoplasm of colon: Secondary | ICD-10-CM

## 2014-01-18 MED ORDER — SODIUM CHLORIDE 0.9 % IV SOLN: 500.0000 mL | INTRAVENOUS | Status: DC

## 2014-01-18 NOTE — Op Note (Addendum)
[place]  [address]. [place], [address]   COLONOSCOPY PROCEDURE REPORT  PATIENT: Amanda [name], Amanda [name]  MR#: [id number] BIRTHDATE: Dec 04, 1960 , 31  yrs. old GENDER: Female ENDOSCOPIST: Lafayette Dragon, MD REFERRED YS:AYTKZ Guest, M.D. PROCEDURE DATE:  01/18/2014 PROCEDURE:   Colonoscopy, screening First Screening Colonoscopy - Avg.  risk and is 50 yrs.  old or older - No.  Prior Negative Screening - Now for repeat screening. 10 or more years since last screening  History of Adenoma - Now for follow-up colonoscopy & has been > or = to 3 yrs.  N/A  Polyps Removed Today? No.  Recommend repeat exam, <10 yrs? No. ASA CLASS:   Class II INDICATIONS:Average risk patient for colon cancer and prior colonoscopy July 2004 showed moderately severe diverticulosis. status post sigmoid resection 3 years ago or diverticulitis MEDICATIONS: MAC sedation, administered by CRNA and Propofol (Diprivan) 250 mg IV  DESCRIPTION OF PROCEDURE:   After the risks benefits and alternatives of the procedure were thoroughly explained, informed consent was obtained.  A digital rectal exam revealed no abnormalities of the rectum.   The LB CF-H180AL Loaner [id number] endoscope was introduced through the anus and advanced to the cecum, which was identified by both the appendix and ileocecal valve. No adverse events experienced.   The quality of the prep was good, using MoviPrep  The instrument was then slowly withdrawn as the colon was fully examined.      COLON FINDINGS: Moderate diverticulosis was noted throughout the entire examined colon.   Small internal hemorrhoids were found. Retroflexed views revealed no abnormalities. The time to cecum=7 minutes 03 seconds.  Withdrawal time=6 minutes 03 seconds.  The scope was withdrawn and the procedure completed. COMPLICATIONS: There were no complications.  ENDOSCOPIC IMPRESSION: 1.   Moderate diverticulosis was noted throughout the  entire examined colon 2.   Small internal hemorrhoids 3.status post remote sigmoid segmental resection for diverticulitis, no visible anastomoses RECOMMENDATIONS: high-fiber diet Recall colonoscopy in 10 years   eSigned:  Lafayette Dragon, MD 01/18/2014 10:43 AM Revised: 01/18/2014 10:43 AM  cc:   PATIENT NAME:  Chastity, Noland MR#: [id number]

## 2014-01-18 NOTE — Progress Notes (Signed)
Procedure ends, to recovery, report given and VSS. 

## 2014-01-18 NOTE — Patient Instructions (Signed)
YOU HAD AN ENDOSCOPIC PROCEDURE TODAY AT [facility]: Refer to the procedure report that was given to you for any specific questions about what was found during the examination.  If the procedure report does not answer your questions, please call your gastroenterologist to clarify.  If you requested that your care partner not be given the details of your procedure findings, then the procedure report has been included in a sealed envelope for you to review at your convenience later.  YOU SHOULD EXPECT: Some feelings of bloating in the abdomen. Passage of more gas than usual.  Walking can help get rid of the air that was put into your GI tract during the procedure and reduce the bloating. If you had a lower endoscopy (such as a colonoscopy or flexible sigmoidoscopy) you may notice spotting of blood in your stool or on the toilet paper. If you underwent a bowel prep for your procedure, then you may not have a normal bowel movement for a few days.  DIET: Your first meal following the procedure should be a light meal and then it is ok to progress to your normal diet.  A half-sandwich or bowl of soup is an example of a good first meal.  Heavy or fried foods are harder to digest and may make you feel nauseous or bloated.  Likewise meals heavy in dairy and vegetables can cause extra gas to form and this can also increase the bloating.  Drink plenty of fluids but you should avoid alcoholic beverages for 24 hours.  ACTIVITY: Your care partner should take you home directly after the procedure.  You should plan to take it easy, moving slowly for the rest of the day.  You can resume normal activity the day after the procedure however you should NOT DRIVE or use heavy machinery for 24 hours (because of the sedation medicines used during the test).    SYMPTOMS TO REPORT IMMEDIATELY: A gastroenterologist can be reached at any hour.  During normal business hours, 8:30 AM to 5:00 PM Monday through Friday,  call [phone].  After hours and on weekends, please call the GI answering service at [phone] who will take a message and have the physician on call contact you.   Following lower endoscopy (colonoscopy or flexible sigmoidoscopy):  Excessive amounts of blood in the stool  Significant tenderness or worsening of abdominal pains  Swelling of the abdomen that is new, acute  Fever of 100F or higher  FOLLOW UP: Our staff will call the home number listed on your records the next business day following your procedure to check on you and address any questions or concerns that you may have at that time regarding the information given to you following your procedure. This is a courtesy call and so if there is no answer at the home number and we have not heard from you through the emergency physician on call, we will assume that you have returned to your regular daily activities without incident.  SIGNATURES/CONFIDENTIALITY: You and/or your care partner have signed paperwork which will be entered into your electronic medical record.  These signatures attest to the fact that that the information above on your After Visit Summary has been reviewed and is understood.  Full responsibility of the confidentiality of this discharge information lies with you and/or your care-partner.  Continue your normal medications  Please read over handouts about diverticulosis, high fiber diets, and hemorrhoids  Follow up in 10 years

## 2014-01-21 ENCOUNTER — Telehealth: Payer: Self-pay

## 2014-01-21 NOTE — Telephone Encounter (Signed)
Called [phone] and left a message for the pt to call us back if any questions or concerns. maw

## 2014-07-05 ENCOUNTER — Ambulatory Visit (INDEPENDENT_AMBULATORY_CARE_PROVIDER_SITE_OTHER): Payer: 59 | Admitting: Family Medicine

## 2014-07-05 VITALS — BP 126/78 | HR 97 | Temp 98.2°F | Resp 17 | Ht 66.5 in | Wt 171.0 lb

## 2014-07-05 DIAGNOSIS — G4700 Insomnia, unspecified: Secondary | ICD-10-CM

## 2014-07-05 DIAGNOSIS — J209 Acute bronchitis, unspecified: Secondary | ICD-10-CM

## 2014-07-05 DIAGNOSIS — I10 Essential (primary) hypertension: Secondary | ICD-10-CM

## 2014-07-05 DIAGNOSIS — M2550 Pain in unspecified joint: Secondary | ICD-10-CM

## 2014-07-05 MED ORDER — AZITHROMYCIN 250 MG PO TABS: ORAL_TABLET | ORAL | Status: DC

## 2014-07-05 MED ORDER — HYDROCODONE-HOMATROPINE 5-1.5 MG/5ML PO SYRP: 5.0000 mL | ORAL_SOLUTION | Freq: Three times a day (TID) | ORAL | Status: DC | PRN

## 2014-07-05 NOTE — Patient Instructions (Signed)

## 2014-07-05 NOTE — Progress Notes (Signed)
Subjective:  This chart was scribed for Robyn Haber by Dellis Filbert, ED Scribe. The patient was seen in Exam room 05 and the patient's care was started at 1:27 PM.   Patient ID: Amanda [name], female    DOB: [birth year], 53 y.o.   MRN: [id number]  HPI HPI Comments: Amanda [name] is a 53 y.o. female who presents to [facility] complaining of an intermittent cold and congestion that has been going for 3 weeks. She does have fatigue and sore throat as an associated symptoms.She says she has an increased coughing at work. She has had sick contacts at work. She denies fever. Pt works in the Conservator, museum/gallery at [employer].  Patient Active Problem List   Diagnosis Date Noted  . Elevated blood pressure (not hypertension) 09/14/2013  . Intestinal diverticular abscess 11/28/2011   Past Medical History  Diagnosis Date  . Tachyarrhythmia 2006    s/p ablation  . Congenital patella maltracking right knee  . Diverticulitis     perforated; required partial colectomy  . Arthritis   . Anxiety   . Pelvic abscess in female 11/28/11  . Pleural effusion, bilateral 11/28/11  . Perforated diverticulitis   . Depression    Past Surgical History  Procedure Laterality Date  . Shoulder arthroscopy w/ rotator cuff repair Right 2010 repair bicep  . Left small fingernail removed  2006  . Cardiac electrophysiology study and ablation  2006    tachycardia  . Ectopic pregnancy surgery  1993  . Exploratory laparotomy  GYN fertility 1990's  . Hernia repair  1988  . Knee surgery Right 2012    lateral release  . Ganglion cyst excision Left 1997  . Pilonidal cyst excision  1983  . Rhinoplasty    . Lasik    . Partial colectomy  01/28/12  . Eye surgery    . Colon surgery     No Known Allergies Prior to Admission medications   Medication Sig Start Date End Date Taking? Authorizing Provider  ALPRAZolam Duanne Moron) 1 MG tablet Take 1 tablet (1 mg total) by mouth at bedtime as needed for sleep. 12/20/11  Yes Elige Radon, MD  glucosamine-chondroitin 500-400 MG tablet Take 1 tablet by mouth daily.    Yes Historical Provider, MD  loperamide (IMODIUM) 2 MG capsule Take 4 mg by mouth as needed for diarrhea or loose stools.   Yes Historical Provider, MD  metoprolol tartrate (LOPRESSOR) 25 MG tablet Take 25 mg by mouth daily.   Yes Historical Provider, MD  Multiple Vitamin (MULTIVITAMIN) tablet Take 1 tablet by mouth daily.    Yes Historical Provider, MD  simethicone (MYLICON) 678 MG chewable tablet Chew 125 mg by mouth every 6 (six) hours as needed for flatulence.   Yes Historical Provider, MD   History   Social History  . Marital Status: Married    Spouse Name: N/A    Number of Children: N/A  . Years of Education: N/A   Occupational History  . Not on file.   Social History Main Topics  . Smoking status: Current Some Day Smoker -- 0.50 packs/day for 32 years    Types: Cigarettes  . Smokeless tobacco: Never Used  . Alcohol Use: 4.2 - 8.4 oz/week    7-14 Glasses of wine per week  . Drug Use: No  . Sexual Activity: Not on file   Other Topics Concern  . Not on file   Social History Narrative  . No narrative on file  Review of Systems  Constitutional: Positive for fatigue.  HENT: Positive for congestion, sinus pressure and sore throat.   Respiratory: Positive for cough.   All other systems reviewed and are negative.      Objective:   Physical Exam  Nursing note and vitals reviewed. Constitutional: She is oriented to person, place, and time. She appears well-developed and well-nourished. No distress.  HENT:  Head: Normocephalic and atraumatic.  Eyes: EOM are normal.  Neck: Normal range of motion.  Cardiovascular: Normal rate.   Pulmonary/Chest: Effort normal.  Musculoskeletal: Normal range of motion.  Neurological: She is alert and oriented to person, place, and time.  Skin: Skin is warm and dry.  Psychiatric: She has a normal mood and affect. Her behavior is normal.   BP  126/78  Pulse 97  Temp(Src) 98.2 F (36.8 C) (Oral)  Resp 17  Ht 5' 6.5" (1.689 m)  Wt 171 lb (77.565 kg)  BMI 27.19 kg/m2  SpO2 96%     Assessment & Plan:  I personally performed the services described in this documentation, which was scribed in my presence. The recorded information has been reviewed and is accurate.  Acute bronchitis, unspecified organism - Plan: azithromycin (ZITHROMAX Z-PAK) 250 MG tablet, HYDROcodone-homatropine (HYCODAN) 5-1.5 MG/5ML syrup  Signed, Robyn Haber, MD

## 2014-07-05 NOTE — Addendum Note (Signed)
Addended by: Robyn Haber on: 07/05/2014 01:44 PM   Modules accepted: Orders, Medications

## 2014-07-11 ENCOUNTER — Other Ambulatory Visit: Payer: Self-pay

## 2014-07-11 DIAGNOSIS — J209 Acute bronchitis, unspecified: Secondary | ICD-10-CM

## 2014-07-11 MED ORDER — HYDROCODONE-HOMATROPINE 5-1.5 MG/5ML PO SYRP: 5.0000 mL | ORAL_SOLUTION | Freq: Three times a day (TID) | ORAL | Status: DC | PRN

## 2014-07-11 NOTE — Telephone Encounter (Signed)
Patient is requesting a refill on cough medication.   Finished last night - still coughing HYDROcodone-homatropine (HYCODAN) 5-1.5 MG/5ML syrup Dr. Carlean Jews   Advised patient to call pharmacy and send request.   [place]   [phone]

## 2014-07-12 ENCOUNTER — Other Ambulatory Visit: Payer: Self-pay | Admitting: Family Medicine

## 2014-07-12 DIAGNOSIS — J209 Acute bronchitis, unspecified: Secondary | ICD-10-CM

## 2014-07-14 MED ORDER — HYDROCODONE-HOMATROPINE 5-1.5 MG/5ML PO SYRP: 5.0000 mL | ORAL_SOLUTION | Freq: Three times a day (TID) | ORAL | Status: DC | PRN

## 2014-07-15 MED ORDER — HYDROCODONE-HOMATROPINE 5-1.5 MG/5ML PO SYRP: 5.0000 mL | ORAL_SOLUTION | Freq: Three times a day (TID) | ORAL | Status: DC | PRN

## 2014-07-15 NOTE — Telephone Encounter (Signed)
This was given to pt (who signed for it) by Larene Beach.

## 2014-07-15 NOTE — Telephone Encounter (Signed)
Original authorization by Dr. Joseph Art did not print, and will not allow reprint.  New Rx printed.  Meds ordered this encounter  Medications  . DISCONTD: HYDROcodone-homatropine (HYCODAN) 5-1.5 MG/5ML syrup    Sig: Take 5 mLs by mouth every 8 (eight) hours as needed for cough.    Dispense:  120 mL    Refill:  0  . HYDROcodone-homatropine (HYCODAN) 5-1.5 MG/5ML syrup    Sig: Take 5 mLs by mouth every 8 (eight) hours as needed for cough.    Dispense:  120 mL    Refill:  0    Order Specific Question:  Supervising Provider    Answer:  DOOLITTLE, ROBERT P [[id number]]

## 2014-07-16 ENCOUNTER — Other Ambulatory Visit: Payer: Self-pay

## 2014-07-16 NOTE — Telephone Encounter (Signed)
Pharm sent a request to refill hycodan. Do you want to give RF or have pt RTC for re-check? Pended.

## 2014-07-17 MED ORDER — HYDROCODONE-HOMATROPINE 5-1.5 MG/5ML PO SYRP: 5.0000 mL | ORAL_SOLUTION | Freq: Three times a day (TID) | ORAL | Status: DC | PRN

## 2014-07-17 NOTE — Addendum Note (Signed)
Addended by: Fara Chute on: 07/17/2014 12:24 PM   Modules accepted: Orders

## 2014-07-17 NOTE — Telephone Encounter (Signed)
If needs more, will need OV.  Rx printed.  Meds ordered this encounter  Medications  . DISCONTD: HYDROcodone-homatropine (HYCODAN) 5-1.5 MG/5ML syrup    Sig: Take 5 mLs by mouth every 8 (eight) hours as needed for cough.    Dispense:  120 mL    Refill:  0  . HYDROcodone-homatropine (HYCODAN) 5-1.5 MG/5ML syrup    Sig: Take 5 mLs by mouth every 8 (eight) hours as needed for cough.    Dispense:  60 mL    Refill:  0

## 2014-07-17 NOTE — Addendum Note (Signed)
Addended by: Elwyn Reach A on: 07/17/2014 09:08 AM   Modules accepted: Orders

## 2014-07-17 NOTE — Telephone Encounter (Addendum)
Dr Joseph Art approved this refill this AM, but I don't see that it printed. It will have to be redone in PA's name anyway in order to have it signed in his absence. Can someone write for this please? Re-pended.

## 2014-07-17 NOTE — Telephone Encounter (Signed)
LMOM that Rx is ready here for p/up and needs re-check if she is still coughing when this refill runs out.

## 2014-10-31 ENCOUNTER — Other Ambulatory Visit: Payer: Self-pay | Admitting: Family Medicine

## 2014-11-12 ENCOUNTER — Ambulatory Visit (INDEPENDENT_AMBULATORY_CARE_PROVIDER_SITE_OTHER): Payer: 59

## 2014-11-12 ENCOUNTER — Ambulatory Visit (INDEPENDENT_AMBULATORY_CARE_PROVIDER_SITE_OTHER): Payer: 59 | Admitting: Family Medicine

## 2014-11-12 VITALS — BP 118/84 | HR 106 | Temp 97.7°F | Resp 16 | Ht 66.25 in | Wt 178.0 lb

## 2014-11-12 DIAGNOSIS — M79632 Pain in left forearm: Secondary | ICD-10-CM

## 2014-11-12 DIAGNOSIS — W19XXXA Unspecified fall, initial encounter: Secondary | ICD-10-CM | POA: Diagnosis not present

## 2014-11-12 MED ORDER — ACETAMINOPHEN 500 MG PO TABS: 1000.0000 mg | ORAL_TABLET | Freq: Three times a day (TID) | ORAL | Status: DC | PRN

## 2014-11-12 MED ORDER — NAPROXEN 500 MG PO TABS: 500.0000 mg | ORAL_TABLET | Freq: Two times a day (BID) | ORAL | Status: DC

## 2014-11-12 NOTE — Patient Instructions (Signed)
RICE: Routine Care for Injuries The routine care of many injuries includes Rest, Ice, Compression, and Elevation (RICE). HOME CARE INSTRUCTIONS  Rest is needed to allow your body to heal. Routine activities can usually be resumed when comfortable. Injured tendons and bones can take up to 6 weeks to heal. Tendons are the cord-like structures that attach muscle to bone.  Ice following an injury helps keep the swelling down and reduces pain.  Put ice in a plastic bag.  Place a towel between your skin and the bag.  Leave the ice on for 15-20 minutes, 3-4 times a day, or as directed by your health care provider. Do this while awake, for the first 24 to 48 hours. After that, continue as directed by your caregiver.  Compression helps keep swelling down. It also gives support and helps with discomfort. If an elastic bandage has been applied, it should be removed and reapplied every 3 to 4 hours. It should not be applied tightly, but firmly enough to keep swelling down. Watch fingers or toes for swelling, bluish discoloration, coldness, numbness, or excessive pain. If any of these problems occur, remove the bandage and reapply loosely. Contact your caregiver if these problems continue.  Elevation helps reduce swelling and decreases pain. With extremities, such as the arms, hands, legs, and feet, the injured area should be placed near or above the level of the heart, if possible. SEEK IMMEDIATE MEDICAL CARE IF:  You have persistent pain and swelling.  You develop redness, numbness, or unexpected weakness.  Your symptoms are getting worse rather than improving after several days. These symptoms may indicate that further evaluation or further X-rays are needed. Sometimes, X-rays may not show a small broken bone (fracture) until 1 week or 10 days later. Make a follow-up appointment with your caregiver. Ask when your X-ray results will be ready. Make sure you get your X-ray results. Document Released:  12/12/2000 Document Revised: 09/04/2013 Document Reviewed: 01/29/2011 Exit[employer] Patient Information 2015 [employer]. This information is not intended to replace advice given to you by your health care provider. Make sure you discuss any questions you have with your health care provider.  

## 2014-11-12 NOTE — Progress Notes (Signed)
11/12/2014 at 6:27 PM  Amanda [name] / DOB: November 12, 1960 / MRN: [id number]  The patient has Insomnia; Essential hypertension; and Arthralgia on her problem list.  SUBJECTIVE  Chief compalaint: Arm Pain   History of present illness: [name] is 54 y.o. well appearing female presenting for left forearm pain d/t to a fall that occurred yesterday in which she slipped getting out of her shower.  She reports her left forearm struck the toilet. Typing makes her arm worse. She has tried ibuprofen 400 mg with poor relief. She denies paresthesia and a change in function or sensation of the hand. She denies pallor of the arm.  She has never been screened for osteoporosis, but she reports her mother lost a lot of height as she aged.     She  has a past medical history of Tachyarrhythmia (2006); Congenital patella maltracking (right knee); Diverticulitis; Arthritis; Anxiety; Pelvic abscess in female (11/28/11); Pleural effusion, bilateral (11/28/11); Perforated diverticulitis; and Depression.    She has a current medication list which includes the following prescription(s): alprazolam, glucosamine-chondroitin, metoprolol succinate, and multivitamin.  [name] has No Known Allergies. She  reports that she has been smoking Cigarettes.  She has a 16 pack-year smoking history. She has never used smokeless tobacco. She reports that she drinks about 4.2 - 8.4 oz of alcohol per week. She reports that she does not use illicit drugs. She  has no sexual activity history on file. The patient  has past surgical history that includes Shoulder arthroscopy w/ rotator cuff repair (Right, 2010 repair bicep); left small fingernail removed (2006); Cardiac electrophysiology study and ablation (2006); Ectopic pregnancy surgery (1993); Exploratory laparotomy (GYN fertility 1990's); Hernia repair (1988); Knee surgery (Right, 2012); Ganglion cyst excision (Left, 1997); Pilonidal cyst excision (1983); Rhinoplasty; LASIK; Partial  colectomy (01/28/12); Eye surgery; and Colon surgery.  Her family history includes Cancer (age of onset: 68) in her father; Colon cancer in her paternal uncle; Heart failure in her mother; Stomach cancer in her cousin and paternal uncle; Stroke in her mother. There is no history of Esophageal cancer or Rectal cancer.  ROS  Per HPI  OBJECTIVE  Her  height is 5' 6.25" (1.683 m) and weight is 178 lb (80.74 kg). Her oral temperature is 97.7 F (36.5 C). Her blood pressure is 118/84 and her pulse is 106. Her respiration is 16 and oxygen saturation is 100%.  The patient's body mass index is 28.5 kg/(m^2).  Physical Exam  Constitutional: She is oriented to person, place, and time. She appears well-developed and well-nourished. No distress.  Cardiovascular:  Pulses:      Radial pulses are 2+ on the left side.  Cap refill less than 2 seconds at the left digits.  The hand is not cool and there is no pallor.    Respiratory: Effort normal.  Musculoskeletal:       Right forearm: Normal.       Left forearm: She exhibits tenderness, bony tenderness and swelling. She exhibits no edema, no deformity and no laceration.       Arms: Neurological: She is alert and oriented to person, place, and time. She has normal strength. No cranial nerve deficit or sensory deficit.   [facility] reading (PRIMARY) by  Dr. Brigitte Pulse: Negative for bony abnormality.     No results found for this or any previous visit (from the past 24 hour(s)).  ASSESSMENT & PLAN  Amanda [name] was seen today for arm pain.  Diagnoses and all orders for this  visit:  Left forearm pain: Radiograph reassuring.  Most likely soft tissue injury.  Orders: -     DG Forearm Left; Future -     Naproxen 500 mg bid for at least 5 days -     Tylenol 1000 mg tid prn -      Ace bandage provided -      Rice information provided in AVS   Fall, initial encounter     The patient was advised to call or come back to clinic if she does not see an improvement in  symptoms, or worsens with the above plan.   Philis Fendt, MHS, PA-C [facility] 11/12/2014 6:27 PM

## 2014-11-28 ENCOUNTER — Encounter: Payer: Self-pay | Admitting: Family Medicine

## 2015-01-28 ENCOUNTER — Ambulatory Visit: Payer: Self-pay | Admitting: Pharmacist

## 2015-02-11 ENCOUNTER — Other Ambulatory Visit: Payer: Self-pay | Admitting: Family Medicine

## 2015-03-18 ENCOUNTER — Other Ambulatory Visit: Payer: Self-pay | Admitting: Physician Assistant

## 2015-04-21 ENCOUNTER — Encounter: Payer: Self-pay | Admitting: Family Medicine

## 2015-04-21 ENCOUNTER — Ambulatory Visit (INDEPENDENT_AMBULATORY_CARE_PROVIDER_SITE_OTHER): Payer: 59 | Admitting: Family Medicine

## 2015-04-21 VITALS — BP 140/96 | HR 103 | Temp 98.2°F | Resp 16 | Ht 66.5 in | Wt 180.0 lb

## 2015-04-21 DIAGNOSIS — Z1389 Encounter for screening for other disorder: Secondary | ICD-10-CM

## 2015-04-21 DIAGNOSIS — M25561 Pain in right knee: Secondary | ICD-10-CM | POA: Diagnosis not present

## 2015-04-21 DIAGNOSIS — Z139 Encounter for screening, unspecified: Secondary | ICD-10-CM

## 2015-04-21 DIAGNOSIS — Z13220 Encounter for screening for lipoid disorders: Secondary | ICD-10-CM

## 2015-04-21 DIAGNOSIS — Z0000 Encounter for general adult medical examination without abnormal findings: Secondary | ICD-10-CM

## 2015-04-21 DIAGNOSIS — Z5181 Encounter for therapeutic drug level monitoring: Secondary | ICD-10-CM | POA: Insufficient documentation

## 2015-04-21 DIAGNOSIS — Z1239 Encounter for other screening for malignant neoplasm of breast: Secondary | ICD-10-CM

## 2015-04-21 DIAGNOSIS — Z6828 Body mass index (BMI) 28.0-28.9, adult: Secondary | ICD-10-CM

## 2015-04-21 DIAGNOSIS — R21 Rash and other nonspecific skin eruption: Secondary | ICD-10-CM

## 2015-04-21 DIAGNOSIS — Z13820 Encounter for screening for osteoporosis: Secondary | ICD-10-CM | POA: Diagnosis not present

## 2015-04-21 DIAGNOSIS — I10 Essential (primary) hypertension: Secondary | ICD-10-CM

## 2015-04-21 LAB — POCT URINALYSIS DIPSTICK
Bilirubin, UA: NEGATIVE
Glucose, UA: NEGATIVE
Ketones, UA: NEGATIVE
Leukocytes, UA: NEGATIVE
NITRITE UA: NEGATIVE
Protein, UA: 30
SPEC GRAV UA: 1.015
Urobilinogen, UA: 0.2
pH, UA: 8.5

## 2015-04-21 LAB — COMPREHENSIVE METABOLIC PANEL
ALT: 95 U/L — ABNORMAL HIGH (ref 6–29)
AST: 112 U/L — AB (ref 10–35)
Albumin: 4.4 g/dL (ref 3.6–5.1)
Alkaline Phosphatase: 93 U/L (ref 33–130)
BUN: 14 mg/dL (ref 7–25)
CO2: 26 mmol/L (ref 20–31)
Calcium: 8.9 mg/dL (ref 8.6–10.4)
Chloride: 100 mmol/L (ref 98–110)
Creat: 0.74 mg/dL (ref 0.50–1.05)
Glucose, Bld: 83 mg/dL (ref 65–99)
POTASSIUM: 4.4 mmol/L (ref 3.5–5.3)
Sodium: 142 mmol/L (ref 135–146)
Total Bilirubin: 0.5 mg/dL (ref 0.2–1.2)
Total Protein: 7.8 g/dL (ref 6.1–8.1)

## 2015-04-21 LAB — CBC
HCT: 45 % (ref 36.0–46.0)
HEMOGLOBIN: 15 g/dL (ref 12.0–15.0)
MCH: 33.8 pg (ref 26.0–34.0)
MCHC: 33.3 g/dL (ref 30.0–36.0)
MCV: 101.4 fL — ABNORMAL HIGH (ref 78.0–100.0)
MPV: 10.2 fL (ref 8.6–12.4)
Platelets: 252 10*3/uL (ref 150–400)
RBC: 4.44 MIL/uL (ref 3.87–5.11)
RDW: 14.7 % (ref 11.5–15.5)
WBC: 7.4 10*3/uL (ref 4.0–10.5)

## 2015-04-21 LAB — C-REACTIVE PROTEIN

## 2015-04-21 LAB — TSH: TSH: 1.103 u[IU]/mL (ref 0.350–4.500)

## 2015-04-21 LAB — LIPID PANEL
Cholesterol: 259 mg/dL — ABNORMAL HIGH (ref 125–200)
HDL: 156 mg/dL (ref >=46)
LDL CALC: 79 mg/dL (ref <130)
TRIGLYCERIDES: 122 mg/dL (ref <150)
Total CHOL/HDL Ratio: 1.7 Ratio (ref <=5.0)
VLDL: 24 mg/dL (ref <30)

## 2015-04-21 MED ORDER — METOPROLOL SUCCINATE ER 25 MG PO TB24: 25.0000 mg | ORAL_TABLET | Freq: Every day | ORAL | Status: DC

## 2015-04-21 MED ORDER — MELOXICAM 15 MG PO TABS: 15.0000 mg | ORAL_TABLET | Freq: Every day | ORAL | Status: DC | PRN

## 2015-04-21 NOTE — Patient Instructions (Addendum)
We will call you about referral to Dr.Ramos and with bone density and mammo screening  Smoking Cessation, Tips for Success If you are ready to quit smoking, congratulations! You have chosen to help yourself be healthier. Cigarettes bring nicotine, tar, carbon monoxide, and other irritants into your body. Your lungs, heart, and blood vessels will be able to work better without these poisons. There are many different ways to quit smoking. Nicotine gum, nicotine patches, a nicotine inhaler, or nicotine nasal spray can help with physical craving. Hypnosis, support groups, and medicines help break the habit of smoking. WHAT THINGS CAN I DO TO MAKE QUITTING EASIER?  Here are some tips to help you quit for good:  Pick a date when you will quit smoking completely. Tell all of your friends and family about your plan to quit on that date.  Do not try to slowly cut down on the number of cigarettes you are smoking. Pick a quit date and quit smoking completely starting on that day.  Throw away all cigarettes.   Clean and remove all ashtrays from your home, work, and car.  On a card, write down your reasons for quitting. Carry the card with you and read it when you get the urge to smoke.  Cleanse your body of nicotine. Drink enough water and fluids to keep your urine clear or pale yellow. Do this after quitting to flush the nicotine from your body.  Learn to predict your moods. Do not let a bad situation be your excuse to have a cigarette. Some situations in your life might tempt you into wanting a cigarette.  Never have "just one" cigarette. It leads to wanting another and another. Remind yourself of your decision to quit.  Change habits associated with smoking. If you smoked while driving or when feeling stressed, try other activities to replace smoking. Stand up when drinking your coffee. Brush your teeth after eating. Sit in a different chair when you read the paper. Avoid alcohol while trying to quit,  and try to drink fewer caffeinated beverages. Alcohol and caffeine may urge you to smoke.  Avoid foods and drinks that can trigger a desire to smoke, such as sugary or spicy foods and alcohol.  Ask people who smoke not to smoke around you.  Have something planned to do right after eating or having a cup of coffee. For example, plan to take a walk or exercise.  Try a relaxation exercise to calm you down and decrease your stress. Remember, you may be tense and nervous for the first 2 weeks after you quit, but this will pass.  Find new activities to keep your hands busy. Play with a pen, coin, or rubber band. Doodle or draw things on paper.  Brush your teeth right after eating. This will help cut down on the craving for the taste of tobacco after meals. You can also try mouthwash.   Use oral substitutes in place of cigarettes. Try using lemon drops, carrots, cinnamon sticks, or chewing gum. Keep them handy so they are available when you have the urge to smoke.  When you have the urge to smoke, try deep breathing.  Designate your home as a nonsmoking area.  If you are a heavy smoker, ask your health care provider about a prescription for nicotine chewing gum. It can ease your withdrawal from nicotine.  Reward yourself. Set aside the cigarette money you save and buy yourself something nice.  Look for support from others. Join a support group or  smoking cessation program. Ask someone at home or at work to help you with your plan to quit smoking.  Always ask yourself, "Do I need this cigarette or is this just a reflex?" Tell yourself, "Today, I choose not to smoke," or "I do not want to smoke." You are reminding yourself of your decision to quit.  Do not replace cigarette smoking with electronic cigarettes (commonly called e-cigarettes). The safety of e-cigarettes is unknown, and some may contain harmful chemicals.  If you relapse, do not give up! Plan ahead and think about what you will do  the next time you get the urge to smoke. HOW WILL I FEEL WHEN I QUIT SMOKING? You may have symptoms of withdrawal because your body is used to nicotine (the addictive substance in cigarettes). You may crave cigarettes, be irritable, feel very hungry, cough often, get headaches, or have difficulty concentrating. The withdrawal symptoms are only temporary. They are strongest when you first quit but will go away within 10-14 days. When withdrawal symptoms occur, stay in control. Think about your reasons for quitting. Remind yourself that these are signs that your body is healing and getting used to being without cigarettes. Remember that withdrawal symptoms are easier to treat than the major diseases that smoking can cause.  Even after the withdrawal is over, expect periodic urges to smoke. However, these cravings are generally short lived and will go away whether you smoke or not. Do not smoke! WHAT RESOURCES ARE AVAILABLE TO HELP ME QUIT SMOKING? Your health care provider can direct you to community resources or hospitals for support, which may include:  Group support.  Education.  Hypnosis.  Therapy. Document Released: 05/28/2004 Document Revised: 01/14/2014 Document Reviewed: 02/15/2013 [facility] Patient Information 2015 [place], [place]. This information is not intended to replace advice given to you by your health care provider. Make sure you discuss any questions you have with your health care provider.

## 2015-04-21 NOTE — Progress Notes (Signed)
Subjective:    Patient ID: Amanda [name], female    DOB: 1961-07-26, 54 y.o.   MRN: [id number]  HPI This is a 54 yo female who presents today for CPE. 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  Her job is stressful. She is divorced and lives with her boyfriend. Relationship good, but there is tension with his grown daughter who periodically lives with them.   Last CPE- 1/15 Mammo- ?07/02/13 Pap- 12/12/12, sees gyn annually Colonoscopy- 01/18/14 Tdap- 2008 Flu- annual Eye- regular Dental-regular Exercise- regular, has been doing high intensity, hour long workouts 2x week  Past Medical History  Diagnosis Date  . Tachyarrhythmia 2006    s/p ablation  . Congenital patella maltracking right knee  . Diverticulitis     perforated; required partial colectomy  . Arthritis   . Anxiety   . Pelvic abscess in female 11/28/11  . Pleural effusion, bilateral 11/28/11  . Perforated diverticulitis   . Depression    Past Surgical History  Procedure Laterality Date  . Shoulder arthroscopy w/ rotator cuff repair Right 2010 repair bicep  . Left small fingernail removed  2006  . Cardiac electrophysiology study and ablation  2006    tachycardia  . Ectopic pregnancy surgery  1993  . Exploratory laparotomy  GYN fertility 1990's  . Hernia repair  1988  . Knee surgery Right 2012    lateral release  . Ganglion cyst excision Left 1997  . Pilonidal cyst excision  1983  . Rhinoplasty    . Lasik    . Partial colectomy  01/28/12  . Eye surgery    . Colon surgery     Family History  Problem Relation Age of Onset  . Heart failure Mother   . Stroke Mother   . Stomach cancer Paternal Uncle   . Colon cancer Paternal Uncle   . Stomach cancer Cousin   . Cancer Father 8    brain tumor  . Esophageal cancer Neg Hx   . Rectal cancer Neg Hx    History  Substance Use Topics  . Smoking status: Current Some Day Smoker -- 0.50 packs/day for 32 years    Types: Cigarettes  . Smokeless tobacco: Never  Used  . Alcohol Use: 4.2 - 8.4 oz/week    7-14 Glasses of wine per week   Review of Systems  Constitutional: Negative.   HENT: Negative.   Eyes: Negative.   Respiratory: Negative.   Cardiovascular: Positive for palpitations (Had one episode of palpitations last week while working out very strenuously. HR 169. No chest pain. Felt a little dizzy. ). Negative for chest pain.  Gastrointestinal:       Has acid reflux controlled with zantac that she takes most days.   Genitourinary: Negative.   Musculoskeletal: Positive for arthralgias (right knee pain for long time. Had surgery for misaligned knee by Dr. Theda Sers. Requesting pain medication.).  Allergic/Immunologic: Negative.   Neurological: Positive for dizziness.  Hematological: Bruises/bleeds easily (Has noticed bruising on lower legs since 1/16. ).  Psychiatric/Behavioral: Positive for sleep disturbance (Takes Xanax nightly prescribed by Dr. Toy Care.).       Mood is ok. Did not tolerate prozac. Manages stress with exercise.       Objective:   Physical Exam Physical Exam  Constitutional: She is oriented to person, place, and time. She appears well-developed and well-nourished. No distress.  HENT:  Head: Normocephalic and atraumatic.  Right Ear: External ear normal.  Left Ear: External ear normal.  Nose:  Nose normal.  Mouth/Throat: Oropharynx is clear and moist. No oropharyngeal exudate.  Eyes: Conjunctivae are normal. Pupils are equal, round, and reactive to light.  Neck: Normal range of motion. Neck supple. No JVD present. No thyromegaly present.  Cardiovascular: Normal rate, regular rhythm, normal heart sounds and intact distal pulses.   Pulmonary/Chest: Effort normal and breath sounds normal. Right breast exhibits no inverted nipple, no mass, no nipple discharge, no skin change and no tenderness. Left breast exhibits no inverted nipple, no mass, no nipple discharge, no skin change and no tenderness. Breasts are symmetrical.    Abdominal: Soft. Bowel sounds are normal. She exhibits no distension and no mass. There is no tenderness. There is no rebound and no guarding.  Musculoskeletal: Normal range of motion. She exhibits no edema or tenderness.  Lymphadenopathy:    She has no cervical adenopathy.  Neurological: She is alert and oriented to person, place, and time. She has normal reflexes.  Skin: Skin is warm and dry. She is not diaphoretic. Bilateral LE with scattered erythematous, papular rash.  Psychiatric: She has a normal mood and affect. Her behavior is normal. Judgment and thought content normal.  Vitals reviewed.  BP 140/96 mmHg  Pulse 103  Temp(Src) 98.2 F (36.8 C) (Oral)  Resp 16  Ht 5' 6.5" (1.689 m)  Wt 180 lb (81.647 kg)  BMI 28.62 kg/m2 Wt Readings from Last 3 Encounters:  04/21/15 180 lb (81.647 kg)  11/12/14 178 lb (80.74 kg)  07/05/14 171 lb (77.565 kg)      Assessment & Plan:  1. Annual physical exam - encouraged her to continue regular exercise, provided written and verbal information regarding smoking cessation  2. Essential hypertension - CBC - Comprehensive metabolic panel - Lipid panel - metoprolol succinate (TOPROL-XL) 25 MG 24 hr tablet; Take 1 tablet (25 mg total) by mouth daily.  Dispense: 90 tablet; Refill: 1  3. BMI 28.0-28.9,adult - Lipid panel - TSH  4. Screening for lipid disorders - Lipid panel  5. Screening for hematuria or proteinuria - POCT urinalysis dipstick  6. Rash and nonspecific skin eruption - Sedimentation Rate - C-reactive protein  7. Screening for breast cancer - MM Digital Screening; Future  8. Screening for osteoporosis - DG Bone Density; Future  9. Right knee pain - discussed chronic nature of her pain and alternatives to narcotic pain medications - Ambulatory referral to Orthopedic Surgery- Dr. Nelva Bush - meloxicam 15 mg po QD  Clarene Reamer, FNP-BC  [facility]  04/23/2015 8:20  AM

## 2015-04-22 LAB — SEDIMENTATION RATE: Sed Rate: 4 mm/hr (ref 0–30)

## 2015-04-23 ENCOUNTER — Other Ambulatory Visit: Payer: Self-pay

## 2015-04-23 ENCOUNTER — Other Ambulatory Visit: Payer: Self-pay | Admitting: Family Medicine

## 2015-04-23 DIAGNOSIS — Z1231 Encounter for screening mammogram for malignant neoplasm of breast: Secondary | ICD-10-CM

## 2015-04-23 DIAGNOSIS — R7989 Other specified abnormal findings of blood chemistry: Secondary | ICD-10-CM

## 2015-04-23 DIAGNOSIS — R945 Abnormal results of liver function studies: Principal | ICD-10-CM

## 2015-04-30 ENCOUNTER — Ambulatory Visit (HOSPITAL_COMMUNITY)
Admission: RE | Admit: 2015-04-30 | Discharge: 2015-04-30 | Disposition: A | Discharge status: Home / Self Care | Payer: 59 | Source: Ambulatory Visit | Attending: Family Medicine | Admitting: Family Medicine

## 2015-04-30 DIAGNOSIS — Z1231 Encounter for screening mammogram for malignant neoplasm of breast: Secondary | ICD-10-CM | POA: Diagnosis not present

## 2015-05-27 ENCOUNTER — Other Ambulatory Visit: Payer: Self-pay | Admitting: Family Medicine

## 2015-05-27 DIAGNOSIS — Z780 Asymptomatic menopausal state: Secondary | ICD-10-CM

## 2015-06-02 ENCOUNTER — Telehealth: Payer: Self-pay | Admitting: Family Medicine

## 2015-06-02 NOTE — Telephone Encounter (Signed)
-----   Message from Amanda [name], [place] sent at 05/31/2015 12:42 PM EDT ----- Please call patient and remind her that she needs to come in for a lab only visit to recheck her liver tests that were elevated the last time she had blood work. Order in computer, so she just needs lab only visit.  Thanks, [employer]

## 2015-06-02 NOTE — Telephone Encounter (Signed)
lmom reminding her to come into our office for repeat of liver test blood draw only

## 2015-06-12 ENCOUNTER — Ambulatory Visit
Admission: RE | Admit: 2015-06-12 | Discharge: 2015-06-12 | Disposition: A | Discharge status: Home / Self Care | Payer: 59 | Source: Ambulatory Visit | Attending: Family Medicine | Admitting: Family Medicine

## 2015-06-12 DIAGNOSIS — Z780 Asymptomatic menopausal state: Secondary | ICD-10-CM

## 2015-06-18 ENCOUNTER — Telehealth: Payer: Self-pay

## 2015-06-18 NOTE — Telephone Encounter (Signed)
Amanda [name] from [facility] left message today at [phone] requesting a call back. She wants to know if ROI form has been received that she faxed yesterday. Please call back at [phone] ext [phone].

## 2015-06-19 NOTE — Telephone Encounter (Signed)
Records faxed to [facility]

## 2015-06-24 ENCOUNTER — Other Ambulatory Visit: Payer: Self-pay | Admitting: Family Medicine

## 2015-06-24 DIAGNOSIS — M810 Age-related osteoporosis without current pathological fracture: Secondary | ICD-10-CM

## 2015-10-06 MED FILL — RALOXIFENE HCL 60 MG TABLET: 60 | 30 days supply | Qty: 30 | Fill #1

## 2015-10-10 MED FILL — HYDROCODON-APAP 5-325: 5-325 | 30 days supply | Qty: 60 | Fill #0

## 2015-10-22 ENCOUNTER — Ambulatory Visit: Payer: Self-pay | Admitting: Family Medicine

## 2015-10-28 DIAGNOSIS — M25561 Pain in right knee: Secondary | ICD-10-CM | POA: Diagnosis not present

## 2015-10-28 DIAGNOSIS — M1711 Unilateral primary osteoarthritis, right knee: Secondary | ICD-10-CM | POA: Diagnosis not present

## 2015-10-28 DIAGNOSIS — M2241 Chondromalacia patellae, right knee: Secondary | ICD-10-CM | POA: Diagnosis not present

## 2015-10-28 DIAGNOSIS — M7062 Trochanteric bursitis, left hip: Secondary | ICD-10-CM | POA: Diagnosis not present

## 2015-11-06 MED FILL — RALOXIFENE HCL 60 MG TABLET: 60 | 30 days supply | Qty: 30 | Fill #2

## 2015-11-07 MED FILL — HYDROCODON-APAP 5-325: 5-325 | 30 days supply | Qty: 60 | Fill #0

## 2015-11-12 ENCOUNTER — Ambulatory Visit: Payer: Self-pay | Admitting: Family Medicine

## 2015-11-19 ENCOUNTER — Encounter: Payer: Self-pay | Admitting: Family Medicine

## 2015-11-19 ENCOUNTER — Ambulatory Visit (INDEPENDENT_AMBULATORY_CARE_PROVIDER_SITE_OTHER): Payer: 59 | Admitting: Family Medicine

## 2015-11-19 VITALS — BP 120/86 | HR 84 | Temp 98.4°F | Resp 18 | Wt 182.4 lb

## 2015-11-19 DIAGNOSIS — I10 Essential (primary) hypertension: Secondary | ICD-10-CM

## 2015-11-19 DIAGNOSIS — M7989 Other specified soft tissue disorders: Secondary | ICD-10-CM | POA: Diagnosis not present

## 2015-11-19 DIAGNOSIS — R7989 Other specified abnormal findings of blood chemistry: Secondary | ICD-10-CM | POA: Diagnosis not present

## 2015-11-19 DIAGNOSIS — R799 Abnormal finding of blood chemistry, unspecified: Secondary | ICD-10-CM | POA: Diagnosis not present

## 2015-11-19 DIAGNOSIS — R945 Abnormal results of liver function studies: Principal | ICD-10-CM

## 2015-11-19 LAB — HEPATIC FUNCTION PANEL
ALBUMIN: 4.2 g/dL (ref 3.6–5.1)
ALT: 76 U/L — AB (ref 6–29)
AST: 71 U/L — AB (ref 10–35)
Alkaline Phosphatase: 75 U/L (ref 33–130)
Bilirubin, Direct: 0.3 mg/dL — ABNORMAL HIGH (ref <=0.2)
Indirect Bilirubin: 0.5 mg/dL (ref 0.2–1.2)
TOTAL PROTEIN: 7.2 g/dL (ref 6.1–8.1)
Total Bilirubin: 0.8 mg/dL (ref 0.2–1.2)

## 2015-11-19 MED ORDER — HYDROCHLOROTHIAZIDE 25 MG PO TABS: ORAL_TABLET | ORAL | Status: DC

## 2015-11-19 MED ORDER — METOPROLOL SUCCINATE ER 25 MG PO TB24: 25.0000 mg | ORAL_TABLET | Freq: Every day | ORAL | Status: DC

## 2015-11-19 MED FILL — HYDROCHLOROTHIAZIDE 25 MG T: 25 | 70 days supply | Qty: 30 | Fill #0

## 2015-11-19 MED FILL — METOPROLOL SUCC ER 25 MG TA: 25 | 90 days supply | Qty: 90 | Fill #0

## 2015-11-19 NOTE — Progress Notes (Signed)
Subjective:    Patient ID: Amanda [name], female    DOB: Oct 07, 1960, 55 y.o.   MRN: [id number]  HPI This is a pleasant 55 yo female who presents today for follow up of HTN, palpatations, elevated LFTs, chronic knee pain. She has not had any more palpitations. She is doing well on metoprolol. She had elevated AST/ALT 8/16. She has been seeing Dr. Nelva Bush for her right knee pain. She developed a left hip bursitis, had an injection and symptoms resolved. She is working out regularly. She takes one Norco twice a day for knee pain. She has noticed she has intermittent swelling of her legs with prolonged sitting. Has some compression socks at home, but doesn't wear because they are difficulty to put on. She has tried a diuretic in the past with good relief.   Stress level and mood improved since last visit. Things at work have settled down.   Past Medical History  Diagnosis Date  . Tachyarrhythmia 2006    s/p ablation  . Congenital patella maltracking right knee  . Diverticulitis     perforated; required partial colectomy  . Arthritis   . Anxiety   . Pelvic abscess in female 11/28/11  . Pleural effusion, bilateral 11/28/11  . Perforated diverticulitis   . Depression    Past Surgical History  Procedure Laterality Date  . Shoulder arthroscopy w/ rotator cuff repair Right 2010 repair bicep  . Left small fingernail removed  2006  . Cardiac electrophysiology study and ablation  2006    tachycardia  . Ectopic pregnancy surgery  1993  . Exploratory laparotomy  GYN fertility 1990's  . Hernia repair  1988  . Knee surgery Right 2012    lateral release  . Ganglion cyst excision Left 1997  . Pilonidal cyst excision  1983  . Rhinoplasty    . Lasik    . Partial colectomy  01/28/12  . Eye surgery    . Colon surgery     Family History  Problem Relation Age of Onset  . Heart failure Mother   . Stroke Mother   . Stomach cancer Paternal Uncle   . Colon cancer Paternal Uncle   . Stomach cancer  Cousin   . Cancer Father 34    brain tumor  . Esophageal cancer Neg Hx   . Rectal cancer Neg Hx    Social History  Substance Use Topics  . Smoking status: Current Some Day Smoker -- 0.50 packs/day for 32 years    Types: Cigarettes  . Smokeless tobacco: Never Used  . Alcohol Use: 4.2 - 8.4 oz/week    7-14 Glasses of wine per week    Review of Systems No chest pain, no SOB, + intermittent leg edema, no abdominal pain, no n/v/d/c    Objective:   Physical Exam Physical Exam  Constitutional: Oriented to person, place, and time. He appears well-developed and well-nourished.  HENT:  Head: Normocephalic and atraumatic.  Eyes: Conjunctivae are normal.  Neck: Normal range of motion. Neck supple.  Cardiovascular: Normal rate, regular rhythm and normal heart sounds.  No LE edema.  Pulmonary/Chest: Effort normal and breath sounds normal.  Musculoskeletal: Normal range of motion.  Neurological: Alert and oriented to person, place, and time.  Skin: Skin is warm and dry.  Psychiatric: Normal mood and affect. Behavior is normal. Judgment and thought content normal.  Vitals reviewed.  BP 120/86 mmHg  Pulse 84  Temp(Src) 98.4 F (36.9 C) (Oral)  Resp 18  Wt 182 lb  6.4 oz (82.736 kg)  SpO2 96%  LMP 12/17/2013 Wt Readings from Last 3 Encounters:  11/19/15 182 lb 6.4 oz (82.736 kg)  04/21/15 180 lb (81.647 kg)  11/12/14 178 lb (80.74 kg)       Assessment & Plan:  1. Elevated LFTs - Hepatic function panel  2. Essential hypertension - metoprolol succinate (TOPROL-XL) 25 MG 24 hr tablet; Take 1 tablet (25 mg total) by mouth daily.  Dispense: 90 tablet; Refill: 1  3. Leg swelling - hydrochlorothiazide (HYDRODIURIL) 25 MG tablet; Take 1/2 - 1 tablet up to 3 times a week for leg edema  Dispense: 30 tablet; Refill: 1  - follow up 8/17 for CPE  Clarene Reamer, FNP-BC  [facility] [place] [place] [facility]  11/21/2015 8:46 PM

## 2015-11-19 NOTE — Progress Notes (Signed)
   Subjective:    Patient ID: Amanda [name], female    DOB: July 20, 1961, 55 y.o.   MRN: [id number]  HPI  This is a pleasant 55 year old that presents today for a heart rate check-up. Patient states that she was stressed las time that she was here. Reports that her medication is helping with her palpations. He has recently joined a gym and is starting to feel better. Reports that her ankles swell at night.  Past Medical History  Diagnosis Date  . Tachyarrhythmia 2006    s/p ablation  . Congenital patella maltracking right knee  . Diverticulitis     perforated; required partial colectomy  . Arthritis   . Anxiety   . Pelvic abscess in female 11/28/11  . Pleural effusion, bilateral 11/28/11  . Perforated diverticulitis   . Depression    Family History  Problem Relation Age of Onset  . Heart failure Mother   . Stroke Mother   . Stomach cancer Paternal Uncle   . Colon cancer Paternal Uncle   . Stomach cancer Cousin   . Cancer Father 12    brain tumor  . Esophageal cancer Neg Hx   . Rectal cancer Neg Hx    Social History   Social History  . Marital Status: Married    Spouse Name: N/A  . Number of Children: N/A  . Years of Education: N/A   Occupational History  . Not on file.   Social History Main Topics  . Smoking status: Current Some Day Smoker -- 0.50 packs/day for 32 years    Types: Cigarettes  . Smokeless tobacco: Never Used  . Alcohol Use: 4.2 - 8.4 oz/week    7-14 Glasses of wine per week  . Drug Use: No  . Sexual Activity: Not on file   Other Topics Concern  . Not on file   Social History Narrative      Review of Systems     Objective:   Physical Exam       BP 120/86 mmHg  Pulse 84  Temp(Src) 98.4 F (36.9 C) (Oral)  Resp 18  Wt 182 lb 6.4 oz (82.736 kg)  SpO2 96%  LMP 12/17/2013 . Wt Readings from Last 3 Encounters:  11/19/15 182 lb 6.4 oz (82.736 kg)  04/21/15 180 lb (81.647 kg)  11/12/14 178 lb (80.74 kg)   Temp Readings from Last  3 Encounters:  11/19/15 98.4 F (36.9 C) Oral  04/21/15 98.2 F (36.8 C) Oral  11/12/14 97.7 F (36.5 C) Oral   BP Readings from Last 3 Encounters:  11/19/15 120/86  04/21/15 140/96  11/12/14 118/84   Pulse Readings from Last 3 Encounters:  11/19/15 84  04/21/15 103  11/12/14 106    Assessment & Plan:

## 2015-11-19 NOTE — Patient Instructions (Signed)

## 2015-11-27 NOTE — Addendum Note (Signed)
Addended by: Clarene Reamer B on: 11/27/2015 09:24 PM   Modules accepted: Orders

## 2015-12-01 MED FILL — ALPRAZolam 1 MG TABS: 1 | 90 days supply | Qty: 180 | Fill #0

## 2015-12-01 MED FILL — RALOXIFENE HCL 60 MG TABLET: 60 | 30 days supply | Qty: 30 | Fill #3

## 2015-12-05 MED FILL — HYDROCODON-APAP 5-325: 5-325 | 30 days supply | Qty: 60 | Fill #0

## 2015-12-10 ENCOUNTER — Ambulatory Visit (INDEPENDENT_AMBULATORY_CARE_PROVIDER_SITE_OTHER): Payer: 59 | Admitting: Emergency Medicine

## 2015-12-10 ENCOUNTER — Ambulatory Visit (INDEPENDENT_AMBULATORY_CARE_PROVIDER_SITE_OTHER): Payer: 59

## 2015-12-10 VITALS — BP 142/98 | HR 80 | Temp 97.9°F | Resp 16 | Ht 66.75 in | Wt 182.0 lb

## 2015-12-10 DIAGNOSIS — R062 Wheezing: Secondary | ICD-10-CM

## 2015-12-10 DIAGNOSIS — R059 Cough, unspecified: Secondary | ICD-10-CM

## 2015-12-10 DIAGNOSIS — R509 Fever, unspecified: Secondary | ICD-10-CM

## 2015-12-10 DIAGNOSIS — R05 Cough: Secondary | ICD-10-CM

## 2015-12-10 DIAGNOSIS — J208 Acute bronchitis due to other specified organisms: Secondary | ICD-10-CM | POA: Diagnosis not present

## 2015-12-10 DIAGNOSIS — R0602 Shortness of breath: Secondary | ICD-10-CM | POA: Diagnosis not present

## 2015-12-10 LAB — POCT CBC
Granulocyte percent: 54.6 %G (ref 37–80)
HEMATOCRIT: 39.8 % (ref 37.7–47.9)
HEMOGLOBIN: 14.4 g/dL (ref 12.2–16.2)
LYMPH, POC: 2.1 (ref 0.6–3.4)
MCH: 36 pg — AB (ref 27–31.2)
MCHC: 36.2 g/dL — AB (ref 31.8–35.4)
MCV: 99.7 fL — AB (ref 80–97)
MID (cbc): 0.5 (ref 0–0.9)
MPV: 7.3 fL (ref 0–99.8)
POC GRANULOCYTE: 3.1 (ref 2–6.9)
POC LYMPH PERCENT: 36.4 %L (ref 10–50)
POC MID %: 9 % (ref 0–12)
Platelet Count, POC: 172 10*3/uL (ref 142–424)
RBC: 3.99 M/uL — AB (ref 4.04–5.48)
RDW, POC: 15.7 %
WBC: 5.7 10*3/uL (ref 4.6–10.2)

## 2015-12-10 LAB — POCT INFLUENZA A/B
Influenza A, POC: NEGATIVE
Influenza B, POC: NEGATIVE

## 2015-12-10 MED ORDER — HYDROCODONE-HOMATROPINE 5-1.5 MG/5ML PO SYRP: 5.0000 mL | ORAL_SOLUTION | Freq: Three times a day (TID) | ORAL | Status: DC | PRN

## 2015-12-10 MED ORDER — PREDNISONE 10 MG PO TABS: ORAL_TABLET | ORAL | Status: DC

## 2015-12-10 MED ORDER — ALBUTEROL SULFATE (2.5 MG/3ML) 0.083% IN NEBU
2.5000 mg | INHALATION_SOLUTION | Freq: Once | RESPIRATORY_TRACT | Status: AC
  Administered 2015-12-10: 2.5 mg via RESPIRATORY_TRACT

## 2015-12-10 MED ORDER — ALBUTEROL SULFATE HFA 108 (90 BASE) MCG/ACT IN AERS: 2.0000 | INHALATION_SPRAY | RESPIRATORY_TRACT | Status: DC | PRN

## 2015-12-10 MED ORDER — IPRATROPIUM BROMIDE 0.02 % IN SOLN
0.5000 mg | Freq: Once | RESPIRATORY_TRACT | Status: AC
  Administered 2015-12-10: 0.5 mg via RESPIRATORY_TRACT

## 2015-12-10 MED ORDER — DOXYCYCLINE HYCLATE 100 MG PO TABS: 100.0000 mg | ORAL_TABLET | Freq: Two times a day (BID) | ORAL | Status: DC

## 2015-12-10 MED FILL — predniSONE 10 MG TABS: 10 | 9 days supply | Qty: 18 | Fill #0

## 2015-12-10 MED FILL — VENTOLIN HFA 90 MCG INHALER: 108 (90 BAS | 16 days supply | Qty: 18 | Fill #0

## 2015-12-10 MED FILL — HYDROCODONE-HOMATROPINE SYR: 5-1.5 | 8 days supply | Qty: 120 | Fill #0

## 2015-12-10 NOTE — Progress Notes (Addendum)
By signing my name below, I, Moises Blood, attest that this documentation has been prepared under the direction and in the presence of Arlyss Queen, MD. Electronically Signed: Moises Blood, [place]. 12/10/2015 , 12:11 PM .  Patient was seen in room 12 .  Chief Complaint:  Chief Complaint  Patient presents with  . Cough    slightly productive, x 1 week  . Chest congestion  . Nasal Congestion    HPI: Amanda [name] is a 55 y.o. female who reports to [facility] today complaining of productive cough in the morning with nasal and chest congestion ongoing for a week now. She had some myalgia initially and a low grade fever 2 days ago. She's taken mucinex and alka-seltzer with mild relief. She used an inhaler in the past for pneumonia. She denies history of asthma.   She smokes half a pack a day. She's taking a smoking cessation class at [facility].  She received a flu shot this year.   Past Medical History  Diagnosis Date  . Tachyarrhythmia 2006    s/p ablation  . Congenital patella maltracking right knee  . Diverticulitis     perforated; required partial colectomy  . Arthritis   . Anxiety   . Pelvic abscess in female 11/28/11  . Pleural effusion, bilateral 11/28/11  . Perforated diverticulitis   . Depression    Past Surgical History  Procedure Laterality Date  . Shoulder arthroscopy w/ rotator cuff repair Right 2010 repair bicep  . Left small fingernail removed  2006  . Cardiac electrophysiology study and ablation  2006    tachycardia  . Ectopic pregnancy surgery  1993  . Exploratory laparotomy  GYN fertility 1990's  . Hernia repair  1988  . Knee surgery Right 2012    lateral release  . Ganglion cyst excision Left 1997  . Pilonidal cyst excision  1983  . Rhinoplasty    . Lasik    . Partial colectomy  01/28/12  . Eye surgery    . Colon surgery     Social History   Social History  . Marital Status: Married    Spouse Name: N/A  . Number of Children: N/A  . Years of  Education: N/A   Social History Main Topics  . Smoking status: Current Some Day Smoker -- 0.50 packs/day for 32 years    Types: Cigarettes  . Smokeless tobacco: Never Used  . Alcohol Use: 4.2 - 8.4 oz/week    7-14 Glasses of wine per week  . Drug Use: No  . Sexual Activity: Not Asked   Other Topics Concern  . None   Social History Narrative   Family History  Problem Relation Age of Onset  . Heart failure Mother   . Stroke Mother   . Stomach cancer Paternal Uncle   . Colon cancer Paternal Uncle   . Stomach cancer Cousin   . Cancer Father 60    brain tumor  . Esophageal cancer Neg Hx   . Rectal cancer Neg Hx    No Known Allergies Prior to Admission medications   Medication Sig Start Date End Date Taking? Authorizing Provider  ALPRAZolam Duanne Moron) 1 MG tablet Take 1 tablet (1 mg total) by mouth at bedtime as needed for sleep. 12/20/11  Yes Elige Radon, MD  glucosamine-chondroitin 500-400 MG tablet Take 1 tablet by mouth daily.    Yes Historical Provider, MD  hydrochlorothiazide (HYDRODIURIL) 25 MG tablet Take 1/2 - 1 tablet up to 3 times  a week for leg edema 11/19/15  Yes Elby Beck, FNP  HYDROcodone-acetaminophen (NORCO/VICODIN) 5-325 MG tablet  11/07/15  Yes Historical Provider, MD  metoprolol succinate (TOPROL-XL) 25 MG 24 hr tablet Take 1 tablet (25 mg total) by mouth daily. 11/19/15  Yes Elby Beck, FNP  Multiple Vitamin (MULTIVITAMIN) tablet Take 1 tablet by mouth daily.    Yes Historical Provider, MD  raloxifene (EVISTA) 60 MG tablet Take 60 mg by mouth daily.   Yes Historical Provider, MD  meloxicam (MOBIC) 15 MG tablet Take 15 mg by mouth daily. Reported on 12/10/2015 04/21/15   Historical Provider, MD     ROS:  Constitutional: negative for fever, chills, night sweats, weight changes, or fatigue  HEENT: negative for vision changes, hearing loss, rhinorrhea, ST, epistaxis, or sinus pressure; positive for congestion Cardiovascular: negative for chest pain or  palpitations Respiratory: negative for hemoptysis, wheezing, shortness of breath; positive for cough Abdominal: negative for abdominal pain, nausea, vomiting, diarrhea, or constipation Dermatological: negative for rash Musc: negative for myalgia Neurologic: negative for headache, dizziness, or syncope All other systems reviewed and are otherwise negative with the exception to those above and in the HPI.  PHYSICAL EXAM: Filed Vitals:   12/10/15 1044  BP: 160/100  Pulse: 80  Temp: 97.9 F (36.6 C)  Resp: 16   Body mass index is 28.73 kg/(m^2).   General: Alert, no acute distress HEENT:  Normocephalic, atraumatic, oropharynx patent. Eye: [facility] Cardiovascular:  Regular rate and rhythm, no rubs murmurs or gallops.  No Carotid bruits, radial pulse intact. No pedal edema.  Respiratory: Clear to auscultation bilaterally.  Wheezes and rhonchi in right upper chest, No cyanosis, no use of accessory musculature Abdominal: No organomegaly, abdomen is soft and non-tender, positive bowel sounds. No masses. Musculoskeletal: Gait intact. No edema, tenderness Skin: No rashes. Neurologic: Facial musculature symmetric. Psychiatric: Patient acts appropriately throughout our interaction.  Lymphatic: No cervical or submandibular lymphadenopathy Genitourinary/Anorectal: No acute findings  LABS: Results for orders placed or performed in visit on 12/10/15  POCT Influenza A/B  Result Value Ref Range   Influenza A, POC Negative Negative   Influenza B, POC Negative Negative  POCT CBC  Result Value Ref Range   WBC 5.7 4.6 - 10.2 K/uL   Lymph, poc 2.1 0.6 - 3.4   POC LYMPH PERCENT 36.4 10 - 50 %L   MID (cbc) 0.5 0 - 0.9   POC MID % 9.0 0 - 12 %M   POC Granulocyte 3.1 2 - 6.9   Granulocyte percent 54.6 37 - 80 %G   RBC 3.99 (A) 4.04 - 5.48 M/uL   Hemoglobin 14.4 12.2 - 16.2 g/dL   HCT, POC 39.8 37.7 - 47.9 %   MCV 99.7 (A) 80 - 97 fL   MCH, POC 36.0 (A) 27 - 31.2 pg   MCHC 36.2 (A) 31.8  - 35.4 g/dL   RDW, POC 15.7 %   Platelet Count, POC 172 142 - 424 K/uL   MPV 7.3 0 - 99.8 fL    EKG/XRAy  Dg Chest 2 View  12/10/2015  CLINICAL DATA:  Shortness of Breath.  Smoker.  Cough, fever. EXAM: CHEST  2 VIEW COMPARISON:  11/05/2013 FINDINGS: The heart size and mediastinal contours are within normal limits. Both lungs are clear. The visualized skeletal structures are unremarkable. IMPRESSION: No active cardiopulmonary disease. Electronically Signed   By: Rolm Baptise M.D.   On: 12/10/2015 12:46   Meds ordered this encounter  Medications  .  albuterol (PROVENTIL) (2.5 MG/3ML) 0.083% nebulizer solution 2.5 mg    Sig:   . ipratropium (ATROVENT) nebulizer solution 0.5 mg    Sig:   . doxycycline (VIBRA-TABS) 100 MG tablet    Sig: Take 1 tablet (100 mg total) by mouth 2 (two) times daily.    Dispense:  20 tablet    Refill:  0  . albuterol (PROVENTIL HFA;VENTOLIN HFA) 108 (90 Base) MCG/ACT inhaler    Sig: Inhale 2 puffs into the lungs every 4 (four) hours as needed for wheezing or shortness of breath (cough, shortness of breath or wheezing.).    Dispense:  1 Inhaler    Refill:  1  . predniSONE (DELTASONE) 10 MG tablet    Sig: Take 3 a day for 3 days  2 a day for 3 days one a day for 3 days    Dispense:  18 tablet    Refill:  0  . HYDROcodone-homatropine (HYCODAN) 5-1.5 MG/5ML syrup    Sig: Take 5 mLs by mouth every 8 (eight) hours as needed for cough.    Dispense:  120 mL    Refill:  0    ASSESSMENT/PLAN:  we'll treat with a tapered dose of prednisone and albuterol inhaler. She was given a prescription for doxy  to hold and if not better in 48 hours or develops productive cough she can get this filled.I personally performed the services described in this documentation, which was scribed in my presence. The recorded information has been reviewed and is accurate.I personally performed the services described in this documentation, which was scribed in my presence. The recorded  information has been reviewed and is accurate.she was also given some Hycodan for cough   Gross sideeffects, risk and benefits, and alternatives of medications d/w patient. Patient is aware that all medications have potential sideeffects and we are unable to predict every sideeffect or drug-drug interaction that may occur.  Arlyss Queen MD 12/10/2015 12:14 PM

## 2015-12-10 NOTE — Patient Instructions (Addendum)
Increased her Metaprel all to twice a day. Gets her antibiotics filled if your symptoms persist. I have given you a prescription for an inhaler, cough medications ,and a prednisone taper.    IF you received an x-ray today, you will receive an invoice from [facility]. Please contact [facility] at [phone] with questions or concerns regarding your invoice.   IF you received labwork today, you will receive an invoice from [employer]. Please contact [facility] at [phone] with questions or concerns regarding your invoice.   Our billing staff will not be able to assist you with questions regarding bills from these companies.  You will be contacted with the lab results as soon as they are available. The fastest way to get your results is to activate your My Chart account. Instructions are located on the last page of this paperwork. If you have not heard from [place] regarding the results in 2 weeks, please contact this office.

## 2015-12-31 MED FILL — VENTOLIN HFA 90 MCG INHALER: 108 (90 BAS | 16 days supply | Qty: 18 | Fill #1

## 2015-12-31 MED FILL — RALOXIFENE HCL 60 MG TABLET: 60 | 30 days supply | Qty: 30 | Fill #4

## 2016-01-05 MED FILL — HYDROCODON-APAP 5-325: 5-325 | 30 days supply | Qty: 60 | Fill #0

## 2016-01-13 DIAGNOSIS — H524 Presbyopia: Secondary | ICD-10-CM | POA: Diagnosis not present

## 2016-01-13 DIAGNOSIS — H5202 Hypermetropia, left eye: Secondary | ICD-10-CM | POA: Diagnosis not present

## 2016-01-13 DIAGNOSIS — H52222 Regular astigmatism, left eye: Secondary | ICD-10-CM | POA: Diagnosis not present

## 2016-01-13 DIAGNOSIS — H5201 Hypermetropia, right eye: Secondary | ICD-10-CM | POA: Diagnosis not present

## 2016-01-28 DIAGNOSIS — G8929 Other chronic pain: Secondary | ICD-10-CM | POA: Diagnosis not present

## 2016-01-28 DIAGNOSIS — M2241 Chondromalacia patellae, right knee: Secondary | ICD-10-CM | POA: Diagnosis not present

## 2016-01-28 DIAGNOSIS — M25561 Pain in right knee: Secondary | ICD-10-CM | POA: Diagnosis not present

## 2016-01-28 DIAGNOSIS — M1711 Unilateral primary osteoarthritis, right knee: Secondary | ICD-10-CM | POA: Diagnosis not present

## 2016-02-13 MED FILL — HYDROCHLOROTHIAZIDE 25 MG T: 25 | 70 days supply | Qty: 30 | Fill #1

## 2016-02-13 MED FILL — RALOXIFENE HCL 60 MG TABLET: 60 | 30 days supply | Qty: 30 | Fill #5

## 2016-02-13 MED FILL — METOPROLOL SUCC ER 25 MG TA: 25 | 90 days supply | Qty: 90 | Fill #1

## 2016-02-13 MED FILL — FLUoxetine HCL 10 MG CAPS: 10 | 90 days supply | Qty: 90 | Fill #2

## 2016-03-02 MED FILL — ALPRAZolam 1 MG TABS: 1 | 90 days supply | Qty: 180 | Fill #0

## 2016-03-08 MED FILL — RALOXIFENE HCL 60 MG TABLET: 60 | 30 days supply | Qty: 30 | Fill #6

## 2016-04-12 MED FILL — RALOXIFENE HCL 60 MG TABLET: 60 | 30 days supply | Qty: 30 | Fill #7

## 2016-04-21 DIAGNOSIS — Z79891 Long term (current) use of opiate analgesic: Secondary | ICD-10-CM | POA: Diagnosis not present

## 2016-04-21 DIAGNOSIS — M1711 Unilateral primary osteoarthritis, right knee: Secondary | ICD-10-CM | POA: Diagnosis not present

## 2016-04-21 DIAGNOSIS — M25561 Pain in right knee: Secondary | ICD-10-CM | POA: Diagnosis not present

## 2016-04-22 MED FILL — HYDROCODON-APAP 5-325: 5-325 | 30 days supply | Qty: 60 | Fill #0

## 2016-04-26 ENCOUNTER — Encounter: Payer: Self-pay | Admitting: Family Medicine

## 2016-05-10 ENCOUNTER — Emergency Department (HOSPITAL_BASED_OUTPATIENT_CLINIC_OR_DEPARTMENT_OTHER)
Admission: EM | Admit: 2016-05-10 | Discharge: 2016-05-10 | Disposition: A | Discharge status: Home / Self Care | Payer: 59 | Attending: Emergency Medicine | Admitting: Emergency Medicine

## 2016-05-10 ENCOUNTER — Encounter (HOSPITAL_BASED_OUTPATIENT_CLINIC_OR_DEPARTMENT_OTHER): Payer: Self-pay | Admitting: *Deleted

## 2016-05-10 ENCOUNTER — Other Ambulatory Visit: Payer: Self-pay | Admitting: Family Medicine

## 2016-05-10 ENCOUNTER — Emergency Department (HOSPITAL_BASED_OUTPATIENT_CLINIC_OR_DEPARTMENT_OTHER): Payer: 59

## 2016-05-10 DIAGNOSIS — M542 Cervicalgia: Secondary | ICD-10-CM | POA: Diagnosis not present

## 2016-05-10 DIAGNOSIS — Y929 Unspecified place or not applicable: Secondary | ICD-10-CM | POA: Insufficient documentation

## 2016-05-10 DIAGNOSIS — S01511A Laceration without foreign body of lip, initial encounter: Secondary | ICD-10-CM | POA: Insufficient documentation

## 2016-05-10 DIAGNOSIS — W1809XA Striking against other object with subsequent fall, initial encounter: Secondary | ICD-10-CM | POA: Insufficient documentation

## 2016-05-10 DIAGNOSIS — S0083XA Contusion of other part of head, initial encounter: Secondary | ICD-10-CM

## 2016-05-10 DIAGNOSIS — S0031XA Abrasion of nose, initial encounter: Secondary | ICD-10-CM

## 2016-05-10 DIAGNOSIS — I10 Essential (primary) hypertension: Secondary | ICD-10-CM | POA: Insufficient documentation

## 2016-05-10 DIAGNOSIS — Z79899 Other long term (current) drug therapy: Secondary | ICD-10-CM | POA: Insufficient documentation

## 2016-05-10 DIAGNOSIS — W19XXXA Unspecified fall, initial encounter: Secondary | ICD-10-CM

## 2016-05-10 DIAGNOSIS — Y999 Unspecified external cause status: Secondary | ICD-10-CM | POA: Insufficient documentation

## 2016-05-10 DIAGNOSIS — Z23 Encounter for immunization: Secondary | ICD-10-CM | POA: Diagnosis not present

## 2016-05-10 DIAGNOSIS — F17210 Nicotine dependence, cigarettes, uncomplicated: Secondary | ICD-10-CM | POA: Diagnosis not present

## 2016-05-10 DIAGNOSIS — S098XXA Other specified injuries of head, initial encounter: Secondary | ICD-10-CM | POA: Diagnosis present

## 2016-05-10 DIAGNOSIS — Y9301 Activity, walking, marching and hiking: Secondary | ICD-10-CM | POA: Insufficient documentation

## 2016-05-10 DIAGNOSIS — S022XXA Fracture of nasal bones, initial encounter for closed fracture: Secondary | ICD-10-CM | POA: Diagnosis not present

## 2016-05-10 LAB — URINALYSIS, ROUTINE W REFLEX MICROSCOPIC
Bilirubin Urine: NEGATIVE
GLUCOSE, UA: NEGATIVE mg/dL
Hgb urine dipstick: NEGATIVE
Ketones, ur: NEGATIVE mg/dL
Nitrite: NEGATIVE
PROTEIN: 30 mg/dL — AB
Specific Gravity, Urine: 1.024 (ref 1.005–1.030)
pH: 7 (ref 5.0–8.0)

## 2016-05-10 LAB — URINE MICROSCOPIC-ADD ON

## 2016-05-10 MED ORDER — LIDOCAINE HCL 1 % IJ SOLN
20.0000 mL | Freq: Once | INTRAMUSCULAR | Status: AC
  Administered 2016-05-10: 20 mL via INTRADERMAL

## 2016-05-10 MED ORDER — BUPIVACAINE-EPINEPHRINE (PF) 0.5% -1:200000 IJ SOLN
INTRAMUSCULAR | Status: AC
  Administered 2016-05-10: 1.8 mL
  Filled 2016-05-10: qty 1.8

## 2016-05-10 MED ORDER — LIDOCAINE HCL (PF) 1 % IJ SOLN: 30.0000 mL | Freq: Once | INTRAMUSCULAR | Status: DC

## 2016-05-10 MED ORDER — TETANUS-DIPHTH-ACELL PERTUSSIS 5-2.5-18.5 LF-MCG/0.5 IM SUSP
0.5000 mL | Freq: Once | INTRAMUSCULAR | Status: AC
  Administered 2016-05-10: 0.5 mL via INTRAMUSCULAR
  Filled 2016-05-10: qty 0.5

## 2016-05-10 MED ORDER — AMOXICILLIN-POT CLAVULANATE 875-125 MG PO TABS: 1.0000 | ORAL_TABLET | Freq: Two times a day (BID) | ORAL | 0 refills | Status: AC

## 2016-05-10 MED ORDER — BUPIVACAINE-EPINEPHRINE (PF) 0.5% -1:200000 IJ SOLN
INTRAMUSCULAR | Status: AC
  Filled 2016-05-10: qty 1.8

## 2016-05-10 MED ORDER — LIDOCAINE HCL (PF) 1 % IJ SOLN: 20.0000 mL | Freq: Once | INTRAMUSCULAR | Status: DC

## 2016-05-10 MED ORDER — BUPIVACAINE-EPINEPHRINE (PF) 0.5% -1:200000 IJ SOLN
1.8000 mL | Freq: Once | INTRAMUSCULAR | Status: AC
  Administered 2016-05-10: 1.8 mL

## 2016-05-10 MED ORDER — LIDOCAINE HCL 1 % IJ SOLN
INTRAMUSCULAR | Status: AC
  Administered 2016-05-10: 20 mL via INTRADERMAL
  Filled 2016-05-10: qty 20

## 2016-05-10 MED FILL — RALOXIFENE HCL 60 MG TABLET: 60 | 30 days supply | Qty: 30 | Fill #8

## 2016-05-10 NOTE — ED Triage Notes (Signed)
She fell last night while walking her dog. Facial abrasions and swelling. Bruise under her left eye.

## 2016-05-10 NOTE — Discharge Instructions (Signed)
Use ice on your lip as often as you can, 20 minutes on, 20 minutes off, and be sure to keep a thin cloth between your skin and the ice. Take over-the-counter ibuprofen or Tylenol as needed for pain. Follow-up with your primary care provider or here at the ED in 3-5 days to have your sutures removed. Follow-up with the plastic surgeon tomorrow to be seen as early as this week. Take the antibiotic as prescribed. If you experience a rash or diarrhea starting this antibiotic and return to the emergency department.  Return to emergency department if you experience fevers, increased swelling, pain, redness, warmth or red streaks around the area of your wounds, headache, visual changes, difficulty breathing or any other concerning symptoms.
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
## 2016-05-11 NOTE — Telephone Encounter (Signed)
Patient states that she'll be out of her medication by tomorrow 8/30 and would like a refill soon as possible

## 2016-05-12 LAB — URINE CULTURE: CULTURE: NO GROWTH

## 2016-05-12 MED FILL — METOPROLOL SUCC ER 25 MG TA: 25 | 90 days supply | Qty: 90 | Fill #0

## 2016-05-13 ENCOUNTER — Ambulatory Visit: Payer: Self-pay | Admitting: Physician Assistant

## 2016-05-14 ENCOUNTER — Ambulatory Visit (INDEPENDENT_AMBULATORY_CARE_PROVIDER_SITE_OTHER): Payer: 59 | Admitting: Physician Assistant

## 2016-05-14 ENCOUNTER — Encounter (HOSPITAL_COMMUNITY): Payer: Self-pay

## 2016-05-14 ENCOUNTER — Other Ambulatory Visit: Payer: Self-pay

## 2016-05-14 ENCOUNTER — Encounter (HOSPITAL_COMMUNITY)
Admission: RE | Admit: 2016-05-14 | Discharge: 2016-05-14 | Disposition: A | Discharge status: Home / Self Care | Payer: 59 | Source: Ambulatory Visit | Attending: Plastic Surgery | Admitting: Plastic Surgery

## 2016-05-14 VITALS — BP 134/80 | HR 97 | Temp 98.6°F | Resp 18 | Ht 66.0 in | Wt 190.6 lb

## 2016-05-14 DIAGNOSIS — Z4802 Encounter for removal of sutures: Secondary | ICD-10-CM | POA: Diagnosis not present

## 2016-05-14 DIAGNOSIS — Z01812 Encounter for preprocedural laboratory examination: Secondary | ICD-10-CM | POA: Insufficient documentation

## 2016-05-14 HISTORY — DX: Edema, unspecified: R60.9

## 2016-05-14 HISTORY — DX: Gastro-esophageal reflux disease without esophagitis: K21.9

## 2016-05-14 HISTORY — PX: COSMETIC SURGERY: SHX468

## 2016-05-14 HISTORY — DX: Localized edema: R60.0

## 2016-05-14 LAB — BASIC METABOLIC PANEL
Anion gap: 8 (ref 5–15)
BUN: 11 mg/dL (ref 6–20)
CHLORIDE: 104 mmol/L (ref 101–111)
CO2: 27 mmol/L (ref 22–32)
CREATININE: 0.73 mg/dL (ref 0.44–1.00)
Calcium: 9.3 mg/dL (ref 8.9–10.3)
GFR calc Af Amer: >60 mL/min (ref >60)
GFR calc non Af Amer: >60 mL/min (ref >60)
Glucose, Bld: 100 mg/dL — ABNORMAL HIGH (ref 65–99)
Potassium: 4.1 mmol/L (ref 3.5–5.1)
Sodium: 139 mmol/L (ref 135–145)

## 2016-05-14 LAB — CBC
HEMATOCRIT: 39.3 % (ref 36.0–46.0)
HEMOGLOBIN: 12.8 g/dL (ref 12.0–15.0)
MCH: 33.8 pg (ref 26.0–34.0)
MCHC: 32.6 g/dL (ref 30.0–36.0)
MCV: 103.7 fL — AB (ref 78.0–100.0)
Platelets: 212 10*3/uL (ref 150–400)
RBC: 3.79 MIL/uL — ABNORMAL LOW (ref 3.87–5.11)
RDW: 14.1 % (ref 11.5–15.5)
WBC: 7.5 10*3/uL (ref 4.0–10.5)

## 2016-05-14 MED ORDER — MUPIROCIN 2 % EX OINT: 1.0000 "application " | TOPICAL_OINTMENT | Freq: Three times a day (TID) | CUTANEOUS | 1 refills | Status: DC

## 2016-05-14 MED FILL — MUPIROCIN 2% OINTMENT: 2 | 20 days supply | Qty: 22 | Fill #0

## 2016-05-14 NOTE — Progress Notes (Signed)
   05/14/2016 2:01 PM   DOB: [birth year] / MRN: [id number]  SUBJECTIVE:  [name] is a well appearing 55 y.o. here today for wound care. She denies exquisite tenderness at the site of the wound, nausea, emesis, fever and chills.  She has been compliant with medical therapy and recommendations thus far.   She has No Known Allergies.   She  has a past medical history of Anxiety; Arthritis; Congenital patella maltracking (right knee); Depression; Diverticulitis; Pelvic abscess in female (11/28/11); Perforated diverticulitis; Pleural effusion, bilateral (11/28/11); and Tachyarrhythmia (2006).    She  reports that she has been smoking Cigarettes.  She has a 16.00 pack-year smoking history. She has never used smokeless tobacco. She reports that she drinks about 4.2 - 8.4 oz of alcohol per week . She reports that she does not use drugs. She  has no sexual activity history on file. The patient  has a past surgical history that includes Shoulder arthroscopy w/ rotator cuff repair (Right, 2010 repair bicep); left small fingernail removed (2006); Cardiac electrophysiology study and ablation (2006); Ectopic pregnancy surgery (1993); Exploratory laparotomy (GYN fertility 1990's); Hernia repair (1988); Knee surgery (Right, 2012); Ganglion cyst excision (Left, 1997); Pilonidal cyst excision (1983); Rhinoplasty; LASIK; Partial colectomy (01/28/12); Eye surgery; and Colon surgery.  Her family history includes Cancer (age of onset: 6) in her father; Colon cancer in her paternal uncle; Heart failure in her mother; Stomach cancer in her cousin and paternal uncle; Stroke in her mother.  Review of Systems  Gastrointestinal: Negative for nausea.  Skin: Positive for rash. Negative for itching.  Neurological: Negative for dizziness.    Problem list and medications reviewed and updated by myself where necessary, and exist elsewhere in the encounter.   OBJECTIVE:  BP 134/80 (BP Location: Right Arm, Patient Position:  Sitting, Cuff Size: Normal)   Pulse 97   Temp 98.6 F (37 C) (Oral)   Resp 18   Ht 5\' 6"  (1.676 m)   Wt 190 lb 9.6 oz (86.5 kg)   LMP 12/17/2013   SpO2 97%   BMI 30.76 kg/m  CrCl cannot be calculated (Patient's most recent lab result is older than the maximum 21 days allowed.).  Physical Exam  HENT:  Head:    Vitals reviewed.   No results found for this or any previous visit (from the past 48 hour(s)).  ASSESSMENT AND PLAN  Wade was seen today for suture / staple removal and other.  Diagnoses and all orders for this visit:  Encounter for removal of sutures: The wound is healing.  Suture removed without difficulty.  She is under the care of a plastic surgeon.  -     mupirocin ointment (BACTROBAN) 2 %; Apply 1 application topically 3 (three) times daily.    The patient was advised to call or return to clinic if she does not see an improvement in symptoms or to seek the care of the closest emergency department if she worsens with the above plan.   Philis Fendt, MHS, PA-C [facility] 05/14/2016 2:01 PM

## 2016-05-14 NOTE — Pre-Procedure Instructions (Signed)
Amanda [name]  05/14/2016      [id number] - [place], [place] - [address] [place] [address] Phone: [phone] Fax: [phone]  [place], [place] - [place] [address] Phone: [phone] Fax: [phone]    Your procedure is scheduled on Thurs, Sept 7 @ 8:30 AM  Report to [facility] Admitting at 6:30 AM  Call this number if you have problems the morning of surgery:  [phone]   Remember:  Do not eat food or drink liquids after midnight.  Take these medicines the morning of surgery with A SIP OF WATER Pain Pill(if needed)              No Goody's,BC's,Aleve,Advil,Motrin,Ibuprofen,Fish Oil,or any Herbal Medications.    Do not wear jewelry, make-up or nail polish.  Do not wear lotions, powders, or perfumes, or deoderant.  Do not shave 48 hours prior to surgery.    Do not bring valuables to the hospital.  [facility] is not responsible for any belongings or valuables.  Contacts, dentures or bridgework may not be worn into surgery.  Leave your suitcase in the car.  After surgery it may be brought to your room.  For patients admitted to the hospital, discharge time will be determined by your treatment team.  Patients discharged the day of surgery will not be allowed to drive home.    [facility]  Before surgery, you can play an important role.  Because skin is not sterile, your skin needs to be as free of germs as possible.  You can reduce the number of germs on you skin by washing with CHG (chlorahexidine gluconate) soap before surgery.  CHG is an antiseptic cleaner which kills germs and bonds with the skin to continue killing germs even after washing.  Please DO NOT use if you have an allergy to CHG or antibacterial soaps.  If your skin becomes reddened/irritated stop using the CHG and inform your nurse when you  arrive at Short Stay.  Do not shave (including legs and underarms) for at least 48 hours prior to the first CHG shower.  You may shave your face.  Please follow these instructions carefully:   1.  Shower with CHG Soap the night before surgery and the                                morning of Surgery.  2.  If you choose to wash your hair, wash your hair first as usual with your       normal shampoo.  3.  After you shampoo, rinse your hair and body thoroughly to remove the                      Shampoo.  4.  Use CHG as you would any other liquid soap.  You can apply chg directly       to the skin and wash gently with scrungie or a clean washcloth.  5.  Apply the CHG Soap to your body ONLY FROM THE NECK DOWN.        Do not use on open wounds or open sores.  Avoid contact with your eyes,       ears, mouth and genitals (private parts).  Wash genitals (private parts)  with your normal soap.  6.  Wash thoroughly, paying special attention to the area where your surgery        will be performed.  7.  Thoroughly rinse your body with warm water from the neck down.  8.  DO NOT shower/wash with your normal soap after using and rinsing off       the CHG Soap.  9.  Pat yourself dry with a clean towel.            10.  Wear clean pajamas.            11.  Place clean sheets on your bed the night of your first shower and do not        sleep with pets.  Day of Surgery  Do not apply any lotions/deoderants the morning of surgery.  Please wear clean clothes to the hospital/surgery center.   Please read over the following fact sheets that you were given. Coughing and Deep Breathing

## 2016-05-14 NOTE — Patient Instructions (Signed)
     IF you received an x-ray today, you will receive an invoice from [facility]. Please contact [facility] at [phone] with questions or concerns regarding your invoice.   IF you received labwork today, you will receive an invoice from [facility]. Please contact [facility] at [phone] with questions or concerns regarding your invoice.   Our billing staff will not be able to assist you with questions regarding bills from these companies.  You will be contacted with the lab results as soon as they are available. The fastest way to get your results is to activate your My Chart account. Instructions are located on the last page of this paperwork. If you have not heard from us regarding the results in 2 weeks, please contact this office.      

## 2016-05-14 NOTE — Progress Notes (Signed)
Cardiologist denies  Medical MD is Dr.Guest  Echo/stress test/heart cath denies  EKG denies in past yr  CXR in epic from 12-10-15

## 2016-05-18 ENCOUNTER — Other Ambulatory Visit: Payer: Self-pay | Admitting: Plastic Surgery

## 2016-05-18 DIAGNOSIS — S022XXA Fracture of nasal bones, initial encounter for closed fracture: Secondary | ICD-10-CM

## 2016-05-18 DIAGNOSIS — S0181XA Laceration without foreign body of other part of head, initial encounter: Secondary | ICD-10-CM

## 2016-05-19 MED ORDER — ACETAMINOPHEN 500 MG PO TABS
1000.0000 mg | ORAL_TABLET | ORAL | Status: AC
  Administered 2016-05-20: 1000 mg via ORAL
  Filled 2016-05-19: qty 2

## 2016-05-19 MED ORDER — CEFAZOLIN SODIUM-DEXTROSE 2-4 GM/100ML-% IV SOLN
2.0000 g | INTRAVENOUS | Status: AC
  Administered 2016-05-20: 2 g via INTRAVENOUS
  Filled 2016-05-19: qty 100

## 2016-05-20 ENCOUNTER — Encounter (HOSPITAL_COMMUNITY)
Admission: RE | Disposition: A | Discharge status: Home / Self Care | Payer: Self-pay | Source: Ambulatory Visit | Attending: Plastic Surgery

## 2016-05-20 ENCOUNTER — Ambulatory Visit (HOSPITAL_COMMUNITY): Payer: 59 | Admitting: Anesthesiology

## 2016-05-20 ENCOUNTER — Encounter (HOSPITAL_COMMUNITY): Payer: Self-pay | Admitting: *Deleted

## 2016-05-20 ENCOUNTER — Ambulatory Visit (HOSPITAL_COMMUNITY)
Admission: RE | Admit: 2016-05-20 | Discharge: 2016-05-20 | Disposition: A | Discharge status: Home / Self Care | Payer: 59 | Source: Ambulatory Visit | Attending: Plastic Surgery | Admitting: Plastic Surgery

## 2016-05-20 DIAGNOSIS — S01501A Unspecified open wound of lip, initial encounter: Secondary | ICD-10-CM | POA: Diagnosis not present

## 2016-05-20 DIAGNOSIS — S022XXA Fracture of nasal bones, initial encounter for closed fracture: Secondary | ICD-10-CM | POA: Insufficient documentation

## 2016-05-20 DIAGNOSIS — I10 Essential (primary) hypertension: Secondary | ICD-10-CM | POA: Diagnosis not present

## 2016-05-20 DIAGNOSIS — S01511A Laceration without foreign body of lip, initial encounter: Secondary | ICD-10-CM | POA: Insufficient documentation

## 2016-05-20 DIAGNOSIS — M1990 Unspecified osteoarthritis, unspecified site: Secondary | ICD-10-CM | POA: Diagnosis not present

## 2016-05-20 DIAGNOSIS — S0121XA Laceration without foreign body of nose, initial encounter: Secondary | ICD-10-CM | POA: Insufficient documentation

## 2016-05-20 DIAGNOSIS — F419 Anxiety disorder, unspecified: Secondary | ICD-10-CM | POA: Diagnosis not present

## 2016-05-20 DIAGNOSIS — F17210 Nicotine dependence, cigarettes, uncomplicated: Secondary | ICD-10-CM | POA: Diagnosis not present

## 2016-05-20 DIAGNOSIS — K219 Gastro-esophageal reflux disease without esophagitis: Secondary | ICD-10-CM | POA: Insufficient documentation

## 2016-05-20 DIAGNOSIS — W19XXXA Unspecified fall, initial encounter: Secondary | ICD-10-CM | POA: Insufficient documentation

## 2016-05-20 DIAGNOSIS — S0181XA Laceration without foreign body of other part of head, initial encounter: Secondary | ICD-10-CM

## 2016-05-20 DIAGNOSIS — S0120XA Unspecified open wound of nose, initial encounter: Secondary | ICD-10-CM | POA: Diagnosis not present

## 2016-05-20 HISTORY — PX: CLOSED REDUCTION NASAL FRACTURE: SHX5365

## 2016-05-20 SURGERY — CLOSED REDUCTION, FRACTURE, NASAL BONE
Anesthesia: General

## 2016-05-20 MED ORDER — OXYCODONE HCL 5 MG/5ML PO SOLN: 5.0000 mg | Freq: Once | ORAL | Status: DC | PRN

## 2016-05-20 MED ORDER — OXYMETAZOLINE HCL 0.05 % NA SOLN
NASAL | Status: DC | PRN
Start: 2016-05-20 — End: 2016-05-20
  Administered 2016-05-20: 1

## 2016-05-20 MED ORDER — FENTANYL CITRATE (PF) 100 MCG/2ML IJ SOLN
25.0000 ug | INTRAMUSCULAR | Status: DC | PRN
  Administered 2016-05-20: 50 ug via INTRAVENOUS
  Administered 2016-05-20 (×2): 25 ug via INTRAVENOUS
  Administered 2016-05-20: 50 ug via INTRAVENOUS

## 2016-05-20 MED ORDER — FENTANYL CITRATE (PF) 100 MCG/2ML IJ SOLN
INTRAMUSCULAR | Status: DC | PRN
  Administered 2016-05-20: 100 ug via INTRAVENOUS
  Administered 2016-05-20 (×2): 50 ug via INTRAVENOUS

## 2016-05-20 MED ORDER — ONDANSETRON HCL 4 MG/2ML IJ SOLN
INTRAMUSCULAR | Status: AC
  Filled 2016-05-20: qty 2

## 2016-05-20 MED ORDER — OXYCODONE HCL 5 MG PO TABS: 5.0000 mg | ORAL_TABLET | Freq: Once | ORAL | Status: DC | PRN

## 2016-05-20 MED ORDER — PROPOFOL 10 MG/ML IV BOLUS
INTRAVENOUS | Status: AC
  Filled 2016-05-20: qty 40

## 2016-05-20 MED ORDER — LACTATED RINGERS IV SOLN
INTRAVENOUS | Status: DC
  Administered 2016-05-20: 07:00:00 via INTRAVENOUS

## 2016-05-20 MED ORDER — DEXAMETHASONE SODIUM PHOSPHATE 10 MG/ML IJ SOLN
INTRAMUSCULAR | Status: DC | PRN
  Administered 2016-05-20: 10 mg via INTRAVENOUS

## 2016-05-20 MED ORDER — OXYMETAZOLINE HCL 0.05 % NA SOLN
NASAL | Status: AC
  Filled 2016-05-20: qty 15

## 2016-05-20 MED ORDER — 0.9 % SODIUM CHLORIDE (POUR BTL) OPTIME
TOPICAL | Status: DC | PRN
  Administered 2016-05-20: 1000 mL

## 2016-05-20 MED ORDER — CHLORHEXIDINE GLUCONATE CLOTH 2 % EX PADS: 6.0000 | MEDICATED_PAD | Freq: Once | CUTANEOUS | Status: DC

## 2016-05-20 MED ORDER — LIDOCAINE 2% (20 MG/ML) 5 ML SYRINGE
INTRAMUSCULAR | Status: AC
  Filled 2016-05-20: qty 5

## 2016-05-20 MED ORDER — LIDOCAINE HCL (CARDIAC) 20 MG/ML IV SOLN
INTRAVENOUS | Status: DC | PRN
  Administered 2016-05-20: 100 mg via INTRAVENOUS

## 2016-05-20 MED ORDER — MIDAZOLAM HCL 2 MG/2ML IJ SOLN
INTRAMUSCULAR | Status: AC
  Filled 2016-05-20: qty 2

## 2016-05-20 MED ORDER — DIPHENHYDRAMINE HCL 50 MG/ML IJ SOLN
INTRAMUSCULAR | Status: AC
  Filled 2016-05-20: qty 1

## 2016-05-20 MED ORDER — PROMETHAZINE HCL 25 MG/ML IJ SOLN: 6.2500 mg | INTRAMUSCULAR | Status: DC | PRN

## 2016-05-20 MED ORDER — FENTANYL CITRATE (PF) 100 MCG/2ML IJ SOLN
INTRAMUSCULAR | Status: AC
  Administered 2016-05-20: 50 ug via INTRAVENOUS
  Filled 2016-05-20: qty 2

## 2016-05-20 MED ORDER — PROPOFOL 10 MG/ML IV BOLUS
INTRAVENOUS | Status: DC | PRN
  Administered 2016-05-20: 50 mg via INTRAVENOUS
  Administered 2016-05-20: 150 mg via INTRAVENOUS
  Administered 2016-05-20: 30 mg via INTRAVENOUS

## 2016-05-20 MED ORDER — ONDANSETRON HCL 4 MG/2ML IJ SOLN
INTRAMUSCULAR | Status: DC | PRN
  Administered 2016-05-20: 4 mg via INTRAVENOUS

## 2016-05-20 MED ORDER — FENTANYL CITRATE (PF) 100 MCG/2ML IJ SOLN
INTRAMUSCULAR | Status: AC
  Filled 2016-05-20: qty 4

## 2016-05-20 MED ORDER — LIDOCAINE-EPINEPHRINE 1 %-1:100000 IJ SOLN
INTRAMUSCULAR | Status: DC | PRN
  Administered 2016-05-20: 20 mL

## 2016-05-20 MED ORDER — DEXAMETHASONE SODIUM PHOSPHATE 10 MG/ML IJ SOLN
INTRAMUSCULAR | Status: AC
  Filled 2016-05-20: qty 1

## 2016-05-20 MED ORDER — FENTANYL CITRATE (PF) 100 MCG/2ML IJ SOLN
INTRAMUSCULAR | Status: AC
  Filled 2016-05-20: qty 2

## 2016-05-20 MED ORDER — MIDAZOLAM HCL 5 MG/5ML IJ SOLN
INTRAMUSCULAR | Status: DC | PRN
  Administered 2016-05-20: 2 mg via INTRAVENOUS

## 2016-05-20 MED ORDER — ROCURONIUM BROMIDE 10 MG/ML (PF) SYRINGE
PREFILLED_SYRINGE | INTRAVENOUS | Status: AC
  Filled 2016-05-20: qty 10

## 2016-05-20 MED ORDER — DIPHENHYDRAMINE HCL 50 MG/ML IJ SOLN
INTRAMUSCULAR | Status: DC | PRN
  Administered 2016-05-20: 25 mg via INTRAVENOUS

## 2016-05-20 MED ORDER — SUCCINYLCHOLINE 20MG/ML (10ML) SYRINGE FOR MEDFUSION PUMP - OPTIME
INTRAMUSCULAR | Status: DC | PRN
  Administered 2016-05-20: 140 mg via INTRAVENOUS

## 2016-05-20 SURGICAL SUPPLY — 38 items
BLADE 10 SAFETY STRL DISP (BLADE) ×3 IMPLANT
CANISTER SUCTION 2500CC (MISCELLANEOUS) ×3 IMPLANT
CLOSURE STERI-STRIP 1/2X4 (GAUZE/BANDAGES/DRESSINGS) ×1
CLSR STERI-STRIP ANTIMIC 1/2X4 (GAUZE/BANDAGES/DRESSINGS) ×2 IMPLANT
COVER MAYO STAND STRL (DRAPES) ×3 IMPLANT
COVER TABLE BACK 60X90 (DRAPES) ×3 IMPLANT
DRAPE HALF SHEET 40X57 (DRAPES) ×3 IMPLANT
DRAPE ORTHO SPLIT 77X108 STRL (DRAPES) ×3
DRAPE SURG ORHT 6 SPLT 77X108 (DRAPES) ×1 IMPLANT
DRESSING MEROCEL 8CM (GAUZE/BANDAGES/DRESSINGS) ×1 IMPLANT
DRSG MEROCEL 8CM (GAUZE/BANDAGES/DRESSINGS) ×3
GAUZE SPONGE 2X2 8PLY STRL LF (GAUZE/BANDAGES/DRESSINGS) ×1 IMPLANT
GAUZE SPONGE 4X4 16PLY XRAY LF (GAUZE/BANDAGES/DRESSINGS) ×3 IMPLANT
GLOVE BIO SURGEON STRL SZ 6.5 (GLOVE) ×2 IMPLANT
GLOVE BIO SURGEONS STRL SZ 6.5 (GLOVE) ×1
GLOVE BIOGEL PI IND STRL 6.5 (GLOVE) ×2 IMPLANT
GLOVE BIOGEL PI IND STRL 7.0 (GLOVE) ×1 IMPLANT
GLOVE BIOGEL PI INDICATOR 6.5 (GLOVE) ×4
GLOVE BIOGEL PI INDICATOR 7.0 (GLOVE) ×2
GLOVE SURG SS PI 6.5 STRL IVOR (GLOVE) ×3 IMPLANT
GOWN STRL REUS W/ TWL LRG LVL3 (GOWN DISPOSABLE) ×2 IMPLANT
GOWN STRL REUS W/TWL LRG LVL3 (GOWN DISPOSABLE) ×4
KIT BASIN OR (CUSTOM PROCEDURE TRAY) ×3 IMPLANT
KIT ROOM TURNOVER OR (KITS) ×3 IMPLANT
KIT SPLINT NASAL DENVER SM BEI (GAUZE/BANDAGES/DRESSINGS) ×3 IMPLANT
NEEDLE HYPO 25GX1X1/2 BEV (NEEDLE) ×3 IMPLANT
NS IRRIG 1000ML POUR BTL (IV SOLUTION) ×3 IMPLANT
PAD ARMBOARD 7.5X6 YLW CONV (MISCELLANEOUS) ×6 IMPLANT
PATTIES SURGICAL .5 X3 (DISPOSABLE) ×3 IMPLANT
SPLINT NASAL THERMO PLAST (MISCELLANEOUS) ×3 IMPLANT
SPONGE GAUZE 2X2 STER 10/PKG (GAUZE/BANDAGES/DRESSINGS) ×2
SUT MON AB 5-0 P3 18 (SUTURE) ×6 IMPLANT
SYR BULB IRRIGATION 50ML (SYRINGE) ×3 IMPLANT
SYR CONTROL 10ML LL (SYRINGE) ×3 IMPLANT
TOWEL OR 17X24 6PK STRL BLUE (TOWEL DISPOSABLE) ×3 IMPLANT
TUBE CONNECTING 12'X1/4 (SUCTIONS) ×1
TUBE CONNECTING 12X1/4 (SUCTIONS) ×2 IMPLANT
WATER STERILE IRR 1000ML POUR (IV SOLUTION) ×3 IMPLANT

## 2016-05-20 NOTE — Anesthesia Procedure Notes (Signed)
Procedure Name: Intubation Date/Time: 05/20/2016 8:17 AM Performed by: [place] Pink Pre-anesthesia Checklist: Patient identified, Emergency Drugs available, Suction available, Patient being monitored and Timeout performed Patient Re-evaluated:Patient Re-evaluated prior to inductionOxygen Delivery Method: Circle system utilized Preoxygenation: Pre-oxygenation with 100% oxygen Intubation Type: IV induction Ventilation: Mask ventilation without difficulty Laryngoscope Size: Glidescope and 4 Grade View: Grade II Tube type: Oral Tube size: 7.0 mm Number of attempts: 1 Airway Equipment and Method: Video-laryngoscopy and Rigid stylet Placement Confirmation: ETT inserted through vocal cords under direct vision,  positive ETCO2 and breath sounds checked- equal and bilateral Secured at: 22 cm Tube secured with: Tape Dental Injury: Teeth and Oropharynx as per pre-operative assessment  Difficulty Due To: Difficulty was anticipated and Difficult Airway- due to anterior larynx

## 2016-05-20 NOTE — Anesthesia Preprocedure Evaluation (Signed)
Anesthesia Evaluation  Patient identified by MRN, date of birth, ID band Patient awake    Reviewed: Allergy & Precautions, NPO status , Patient's Chart, lab work & pertinent test results  Airway Mallampati: II  TM Distance: >3 FB Neck ROM: Full    Dental no notable dental hx.    Pulmonary neg pulmonary ROS, Current Smoker,    Pulmonary exam normal breath sounds clear to auscultation       Cardiovascular hypertension, Normal cardiovascular exam Rhythm:Regular Rate:Normal     Neuro/Psych negative neurological ROS  negative psych ROS   GI/Hepatic Neg liver ROS, GERD  Medicated,  Endo/Other  negative endocrine ROS  Renal/GU negative Renal ROS  negative genitourinary   Musculoskeletal negative musculoskeletal ROS (+)   Abdominal   Peds negative pediatric ROS (+)  Hematology negative hematology ROS (+)   Anesthesia Other Findings   Reproductive/Obstetrics negative OB ROS                             Anesthesia Physical Anesthesia Plan  ASA: II  Anesthesia Plan: General   Post-op Pain Management:    Induction: Intravenous  Airway Management Planned: Oral ETT and LMA  Additional Equipment:   Intra-op Plan:   Post-operative Plan: Extubation in OR  Informed Consent: I have reviewed the patients History and Physical, chart, labs and discussed the procedure including the risks, benefits and alternatives for the proposed anesthesia with the patient or authorized representative who has indicated his/her understanding and acceptance.   Dental advisory given  Plan Discussed with: CRNA and Surgeon  Anesthesia Plan Comments:         Anesthesia Quick Evaluation

## 2016-05-20 NOTE — Discharge Instructions (Addendum)
Do not blow nose Keep head elevated as able Don't remove splint

## 2016-05-20 NOTE — H&P (Signed)
Amanda [name] is an 55 y.o. female.   Chief Complaint: nasal fracture, facial laceration HPI: The patient is a 55 yrs old wf here for treatment of a nasal fracture and severe facial lacerations.  She feel a week ago and was seen in the ED.  The upper lip laceration was partially repaired.  There appears to be skin missing at the nasal ala on the right.  There is no sign of infection.  She is otherwise healthy.    Past Medical History:  Diagnosis Date  . Anxiety   . Arthritis   . Congenital patella maltracking right knee  . Depression   . Diverticulitis    perforated; required partial colectomy  . GERD (gastroesophageal reflux disease)    takes Zantac daily  . Perforated diverticulitis   . Peripheral edema    takes HCTZ daily as needed  . Pleural effusion, bilateral 11/28/11  . Tachyarrhythmia    s/p ablation-takes Metoprolol daily    Past Surgical History:  Procedure Laterality Date  . CARDIAC ELECTROPHYSIOLOGY STUDY AND ABLATION  2006   tachycardia  . COLON SURGERY    . [place]  . EXPLORATORY LAPAROTOMY  GYN fertility 1990's  . EYE SURGERY    . GANGLION CYST EXCISION Left 1997  . HERNIA REPAIR  1988  . KNEE SURGERY Right 2012   lateral release  . LASIK    . left small fingernail removed  2006  . PARTIAL COLECTOMY  01/28/12  . PILONIDAL CYST EXCISION  1983  . RHINOPLASTY    . SHOULDER ARTHROSCOPY W/ ROTATOR CUFF REPAIR Right 2010 repair bicep    Family History  Problem Relation Age of Onset  . Heart failure Mother   . Stroke Mother   . Cancer Father 34    brain tumor  . Stomach cancer Paternal Uncle   . Colon cancer Paternal Uncle   . Stomach cancer Cousin   . Esophageal cancer Neg Hx   . Rectal cancer Neg Hx    Social History:  reports that she has been smoking Cigarettes.  She has a 16.00 pack-year smoking history. She has never used smokeless tobacco. She reports that she drinks about 4.2 - 8.4 oz of alcohol per week . She reports that  she does not use drugs.  Allergies:  Allergies  Allergen Reactions  . No Known Allergies     Medications Prior to Admission  Medication Sig Dispense Refill  . acetaminophen (TYLENOL) 325 MG tablet Take 325-650 mg by mouth every 4 (four) hours as needed for moderate pain or headache.    . ALPRAZolam (XANAX) 1 MG tablet Take 1 tablet (1 mg total) by mouth at bedtime as needed for sleep. 90 tablet 1  . HYDROcodone-acetaminophen (NORCO/VICODIN) 5-325 MG tablet Take 1 tablet by mouth every 4 (four) hours as needed for moderate pain.   0  . ibuprofen (ADVIL,MOTRIN) 200 MG tablet Take 400 mg by mouth every 6 (six) hours as needed (for pain).    . metoprolol succinate (TOPROL-XL) 25 MG 24 hr tablet TAKE 1 TABLET BY MOUTH DAILY. (Patient taking differently: Take 25 mg by mouth daily) 90 tablet 0  . Multiple Vitamin (MULTIVITAMIN) tablet Take 1 tablet by mouth every evening.     . mupirocin ointment (BACTROBAN) 2 % Apply 1 application topically 3 (three) times daily. 22 g 1  . raloxifene (EVISTA) 60 MG tablet Take 60 mg by mouth daily.    . ranitidine (ZANTAC) 150 MG tablet  Take 150 mg by mouth every evening.    . hydrochlorothiazide (HYDRODIURIL) 25 MG tablet Take 1/2 - 1 tablet up to 3 times a week for leg edema (Patient not taking: Reported on 05/13/2016) 30 tablet 1    No results found for this or any previous visit (from the past 48 hour(s)). No results found.  Review of Systems  Constitutional: Negative.   HENT: Negative.   Eyes: Negative.   Respiratory: Negative.   Cardiovascular: Negative.   Gastrointestinal: Negative.   Genitourinary: Negative.   Musculoskeletal: Negative.   Skin: Negative.   Neurological: Negative.   Psychiatric/Behavioral: Negative.     Blood pressure (!) 127/94, pulse 78, temperature 98.3 F (36.8 C), temperature source Oral, resp. rate 18, height 5\' 6"  (1.676 m), weight 86.8 kg (191 lb 4 oz), last menstrual period 12/17/2013, SpO2 98 %. Physical Exam    Constitutional: She is oriented to person, place, and time. She appears well-developed and well-nourished.  HENT:  Head: Normocephalic and atraumatic.  Nose:    Eyes: EOM are normal. Pupils are equal, round, and reactive to light.  Cardiovascular: Normal rate.   Respiratory: Effort normal.  Neurological: She is alert and oriented to person, place, and time.  Skin: Skin is warm.  Psychiatric: She has a normal mood and affect. Her behavior is normal. Judgment and thought content normal.     Assessment/Plan Plan for closed nasal fracture reduction and repair of facial lacerations.  Wallace Going, DO 05/20/2016, 7:56 AM

## 2016-05-20 NOTE — Brief Op Note (Signed)
05/20/2016  8:11 AM  PATIENT:  Amanda [name]  55 y.o. female  PRE-OPERATIVE DIAGNOSIS:  nasal fracture, facial laceration  POST-OPERATIVE DIAGNOSIS:  nasal fracture, facial laceration  PROCEDURE:  Procedure(s): CLOSED REDUCTION NASAL FRACTURE AND REPAIR OF NOSE AND LIP LACERATION (N/A)  SURGEON:  Surgeon(s) and Role:    * Claire S Dillingham, DO - Primary  PHYSICIAN ASSISTANT:   ASSISTANTS: none   ANESTHESIA:   general  EBL:  No intake/output data recorded.  BLOOD ADMINISTERED:none  DRAINS: none   LOCAL MEDICATIONS USED:  LIDOCAINE   SPECIMEN:  No Specimen  DISPOSITION OF SPECIMEN:  N/A  COUNTS:  YES  TOURNIQUET:  * No tourniquets in log *  DICTATION: .Dragon Dictation  PLAN OF CARE: Discharge to home after PACU  PATIENT DISPOSITION:  PACU - hemodynamically stable.   Delay start of Pharmacological VTE agent (>24hrs) due to surgical blood loss or risk of bleeding: no

## 2016-05-20 NOTE — Op Note (Signed)
Operative Note   DATE OF OPERATION: 05/20/2016  LOCATION:  [facility]   SURGICAL DIVISION: Plastic Surgery  PREOPERATIVE DIAGNOSES:  Nasal fracture, nasal laceration and upper lip laceration.  POSTOPERATIVE DIAGNOSES:  same  PROCEDURE:   1. Closed Nasal fracture reduction. 2. Complex repair of upper lip laceration 3 cm. 3. Complex repair of nasal laceration 2 cm.  SURGEON: Kieth Hartis Sanger Adrienne Delay, DO  ASSISTANT: Shawn Rayburn, PA  ANESTHESIA:  General.   COMPLICATIONS: None.   INDICATIONS FOR PROCEDURE:  The patient, Amanda [name] is a 55 y.o. female born on October 24, 1960, is here for treatment of a nasal fracture and facial lacerations after a fall.   MRN: [id number]  CONSENT:  Informed consent was obtained directly from the patient. Risks, benefits and alternatives were fully discussed. Specific risks including but not limited to bleeding, infection, hematoma, seroma, scarring, pain, infection, contracture, asymmetry, wound healing problems, and need for further surgery were all discussed. The patient did have an ample opportunity to have questions answered to satisfaction.   DESCRIPTION OF PROCEDURE:   The patient was taken to the operating room. SCDs were placed and IV antibiotics were given. The patient's operative site was prepped and draped in a sterile fashion. A time out was performed and all information was confirmed to be correct.  General anesthesia was administered.  The afrin soaked pledgets were placed in the nose.  The lateral nasal walls and upper lip was injected with local.  After waiting several minutes for the vasoconstriction the speculum was placed in the nose and the nasal fracture was reset into alignment for both the right and left.   The upper lip had a 3 cm oblique an irregular laceration that went through the red portion of the lip.  The edges were excised and the lateral aspect freed to get a tension free closure.  The deep layers  were closed with 5-0 Monocryl followed by 5-0 vertical mattress sutures.  A steri strip was applied.  The nose laceration was debrided sharply to excise the skin edges 2 cm.  There was a complete laceration of the ala disconnecting it to the tip.  This was repaired with 5-0 Monocryl vertical mattress sutures.  The sides where undermined to decrease tension.  The edges were pulled together and sutured with 5-0 Monocryl.  Steri strips were applied.  The nasal splint was placed.  The posterior pharynx was suctioned to remove the blood.  The patient tolerated the procedure well.  There were no complications. The patient was allowed to wake from anesthesia, extubated and taken to the recovery room in satisfactory condition.

## 2016-05-20 NOTE — Anesthesia Postprocedure Evaluation (Signed)
Anesthesia Post Note  Patient: Amanda [name]  Procedure(s) Performed: Procedure(s) (LRB): CLOSED REDUCTION NASAL FRACTURE AND REPAIR OF NOSE AND LIP LACERATION (N/A)  Patient location during evaluation: PACU Anesthesia Type: General Level of consciousness: awake and alert Pain management: pain level controlled Vital Signs Assessment: post-procedure vital signs reviewed and stable Respiratory status: spontaneous breathing, nonlabored ventilation, respiratory function stable and patient connected to nasal cannula oxygen Cardiovascular status: blood pressure returned to baseline and stable Postop Assessment: no signs of nausea or vomiting Anesthetic complications: no    Last Vitals:  Vitals:   05/20/16 0930 05/20/16 0945  BP: (!) 153/91   Pulse: 90 89  Resp: 14 11  Temp:      Last Pain:  Vitals:   05/20/16 0945  TempSrc:   PainSc: 6                  Andros Channing S

## 2016-05-20 NOTE — Transfer of Care (Signed)
Immediate Anesthesia Transfer of Care Note  Patient: Amanda [name]  Procedure(s) Performed: Procedure(s): CLOSED REDUCTION NASAL FRACTURE AND REPAIR OF NOSE AND LIP LACERATION (N/A)  Patient Location: PACU  Anesthesia Type:General  Level of Consciousness: awake, alert  and oriented  Airway & Oxygen Therapy: Patient Spontanous Breathing and Patient connected to face mask oxygen  Post-op Assessment: Report given to RN and Post -op Vital signs reviewed and stable  Post vital signs: Reviewed and stable  Last Vitals:  Vitals:   05/20/16 0707 05/20/16 0917  BP: (!) 127/94 (!) 164/104  Pulse: 78 95  Resp: 18 16  Temp: 36.8 C 36.8 C    Last Pain:  Vitals:   05/20/16 0723  TempSrc:   PainSc: 3       Patients Stated Pain Goal: 4 ([id number])  Complications: No apparent anesthesia complications

## 2016-05-20 NOTE — Progress Notes (Signed)
Pt BP 165/98. Pt states BP has been consistently high and has already planned to talk to primary MD about it. Dr.Rose notified. No new orders. Okay to d/c pt home at this time.

## 2016-05-21 ENCOUNTER — Encounter (HOSPITAL_COMMUNITY): Payer: Self-pay | Admitting: Plastic Surgery

## 2016-05-21 MED FILL — HYDROCODON-APAP 5-325: 5-325 | 30 days supply | Qty: 60 | Fill #0

## 2016-05-31 MED FILL — ALPRAZolam 1 MG TABS: 1 | 90 days supply | Qty: 180 | Fill #0

## 2016-06-01 ENCOUNTER — Emergency Department (HOSPITAL_BASED_OUTPATIENT_CLINIC_OR_DEPARTMENT_OTHER): Payer: 59

## 2016-06-01 ENCOUNTER — Encounter (HOSPITAL_BASED_OUTPATIENT_CLINIC_OR_DEPARTMENT_OTHER): Payer: Self-pay | Admitting: *Deleted

## 2016-06-01 ENCOUNTER — Emergency Department (HOSPITAL_BASED_OUTPATIENT_CLINIC_OR_DEPARTMENT_OTHER)
Admission: EM | Admit: 2016-06-01 | Discharge: 2016-06-01 | Disposition: A | Discharge status: Home / Self Care | Payer: 59 | Attending: Emergency Medicine | Admitting: Emergency Medicine

## 2016-06-01 DIAGNOSIS — I10 Essential (primary) hypertension: Secondary | ICD-10-CM | POA: Insufficient documentation

## 2016-06-01 DIAGNOSIS — Y92410 Unspecified street and highway as the place of occurrence of the external cause: Secondary | ICD-10-CM | POA: Diagnosis not present

## 2016-06-01 DIAGNOSIS — Z79899 Other long term (current) drug therapy: Secondary | ICD-10-CM | POA: Insufficient documentation

## 2016-06-01 DIAGNOSIS — M533 Sacrococcygeal disorders, not elsewhere classified: Secondary | ICD-10-CM | POA: Diagnosis not present

## 2016-06-01 DIAGNOSIS — M545 Low back pain: Secondary | ICD-10-CM | POA: Diagnosis not present

## 2016-06-01 DIAGNOSIS — S3993XA Unspecified injury of pelvis, initial encounter: Secondary | ICD-10-CM | POA: Diagnosis not present

## 2016-06-01 DIAGNOSIS — Z87891 Personal history of nicotine dependence: Secondary | ICD-10-CM | POA: Insufficient documentation

## 2016-06-01 DIAGNOSIS — S299XXA Unspecified injury of thorax, initial encounter: Secondary | ICD-10-CM | POA: Diagnosis present

## 2016-06-01 DIAGNOSIS — Y999 Unspecified external cause status: Secondary | ICD-10-CM | POA: Insufficient documentation

## 2016-06-01 DIAGNOSIS — R079 Chest pain, unspecified: Secondary | ICD-10-CM | POA: Diagnosis not present

## 2016-06-01 DIAGNOSIS — S20211A Contusion of right front wall of thorax, initial encounter: Secondary | ICD-10-CM | POA: Diagnosis not present

## 2016-06-01 DIAGNOSIS — J939 Pneumothorax, unspecified: Secondary | ICD-10-CM | POA: Diagnosis not present

## 2016-06-01 DIAGNOSIS — Y9389 Activity, other specified: Secondary | ICD-10-CM | POA: Diagnosis not present

## 2016-06-01 DIAGNOSIS — K631 Perforation of intestine (nontraumatic): Secondary | ICD-10-CM | POA: Diagnosis not present

## 2016-06-01 LAB — CBC WITH DIFFERENTIAL/PLATELET
BASOS PCT: 0 %
Basophils Absolute: 0 10*3/uL (ref 0.0–0.1)
Eosinophils Absolute: 0.1 10*3/uL (ref 0.0–0.7)
Eosinophils Relative: 2 %
HEMATOCRIT: 36.8 % (ref 36.0–46.0)
HEMOGLOBIN: 12.2 g/dL (ref 12.0–15.0)
LYMPHS ABS: 1.5 10*3/uL (ref 0.7–4.0)
Lymphocytes Relative: 19 %
MCH: 33.6 pg (ref 26.0–34.0)
MCHC: 33.2 g/dL (ref 30.0–36.0)
MCV: 101.4 fL — ABNORMAL HIGH (ref 78.0–100.0)
MONOS PCT: 11 %
Monocytes Absolute: 0.9 10*3/uL (ref 0.1–1.0)
NEUTROS ABS: 5.4 10*3/uL (ref 1.7–7.7)
NEUTROS PCT: 68 %
Platelets: 257 10*3/uL (ref 150–400)
RBC: 3.63 MIL/uL — ABNORMAL LOW (ref 3.87–5.11)
RDW: 13.2 % (ref 11.5–15.5)
WBC: 7.9 10*3/uL (ref 4.0–10.5)

## 2016-06-01 LAB — BASIC METABOLIC PANEL
Anion gap: 9 (ref 5–15)
BUN: 11 mg/dL (ref 6–20)
CHLORIDE: 107 mmol/L (ref 101–111)
CO2: 25 mmol/L (ref 22–32)
CREATININE: 0.59 mg/dL (ref 0.44–1.00)
Calcium: 9.1 mg/dL (ref 8.9–10.3)
GFR calc non Af Amer: >60 mL/min (ref >60)
Glucose, Bld: 91 mg/dL (ref 65–99)
Potassium: 4.1 mmol/L (ref 3.5–5.1)
Sodium: 141 mmol/L (ref 135–145)

## 2016-06-01 LAB — URINALYSIS, ROUTINE W REFLEX MICROSCOPIC
BILIRUBIN URINE: NEGATIVE
Glucose, UA: NEGATIVE mg/dL
Hgb urine dipstick: NEGATIVE
Ketones, ur: NEGATIVE mg/dL
Leukocytes, UA: NEGATIVE
NITRITE: NEGATIVE
PH: 5.5 (ref 5.0–8.0)
Protein, ur: NEGATIVE mg/dL
SPECIFIC GRAVITY, URINE: 1.004 — AB (ref 1.005–1.030)

## 2016-06-01 LAB — PREGNANCY, URINE: Preg Test, Ur: NEGATIVE

## 2016-06-01 MED ORDER — IBUPROFEN 600 MG PO TABS: 600.0000 mg | ORAL_TABLET | Freq: Four times a day (QID) | ORAL | 0 refills | Status: AC | PRN

## 2016-06-01 MED ORDER — FENTANYL CITRATE (PF) 100 MCG/2ML IJ SOLN
50.0000 ug | Freq: Once | INTRAMUSCULAR | Status: AC
  Administered 2016-06-01: 50 ug via INTRAVENOUS
  Filled 2016-06-01: qty 2

## 2016-06-01 MED ORDER — IBUPROFEN 400 MG PO TABS
600.0000 mg | ORAL_TABLET | Freq: Once | ORAL | Status: AC
  Administered 2016-06-01: 600 mg via ORAL
  Filled 2016-06-01: qty 1

## 2016-06-01 MED ORDER — METHOCARBAMOL 500 MG PO TABS: 500.0000 mg | ORAL_TABLET | Freq: Two times a day (BID) | ORAL | 0 refills | Status: DC

## 2016-06-01 MED ORDER — HYDROCODONE-ACETAMINOPHEN 5-325 MG PO TABS: 1.0000 | ORAL_TABLET | Freq: Two times a day (BID) | ORAL | 0 refills | Status: DC | PRN
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
## 2016-06-01 NOTE — ED Notes (Signed)
EDP advised pt requesting more pain med. He will reassess and order.
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
## 2016-06-02 MED FILL — METHOCARBAMOL 500 MG TABLET: 500 | 10 days supply | Qty: 20 | Fill #0

## 2016-06-17 MED FILL — RALOXIFENE HCL 60 MG TABLET: 60 | 30 days supply | Qty: 30 | Fill #9

## 2016-06-18 DIAGNOSIS — S0121XD Laceration without foreign body of nose, subsequent encounter: Secondary | ICD-10-CM | POA: Diagnosis not present

## 2016-06-18 DIAGNOSIS — S01511D Laceration without foreign body of lip, subsequent encounter: Secondary | ICD-10-CM | POA: Diagnosis not present

## 2016-06-18 MED FILL — HYDROCODON-APAP 5-325: 5-325 | 30 days supply | Qty: 60 | Fill #0

## 2016-06-23 ENCOUNTER — Other Ambulatory Visit: Payer: Self-pay | Admitting: Physician Assistant

## 2016-06-23 DIAGNOSIS — L603 Nail dystrophy: Secondary | ICD-10-CM | POA: Diagnosis not present

## 2016-06-23 DIAGNOSIS — D485 Neoplasm of uncertain behavior of skin: Secondary | ICD-10-CM | POA: Diagnosis not present

## 2016-07-16 DIAGNOSIS — S01511D Laceration without foreign body of lip, subsequent encounter: Secondary | ICD-10-CM | POA: Diagnosis not present

## 2016-07-16 MED FILL — RALOXIFENE HCL 60 MG TABLET: 60 | 30 days supply | Qty: 30 | Fill #10

## 2016-07-16 MED FILL — HYDROCODON-APAP 5-325: 5-325 | 30 days supply | Qty: 60 | Fill #0

## 2016-07-19 ENCOUNTER — Ambulatory Visit (INDEPENDENT_AMBULATORY_CARE_PROVIDER_SITE_OTHER): Payer: 59 | Admitting: Family Medicine

## 2016-07-19 ENCOUNTER — Other Ambulatory Visit (HOSPITAL_BASED_OUTPATIENT_CLINIC_OR_DEPARTMENT_OTHER): Payer: Self-pay | Admitting: Obstetrics & Gynecology

## 2016-07-19 VITALS — BP 128/90 | HR 76 | Temp 98.0°F | Resp 16 | Ht 66.0 in | Wt 193.6 lb

## 2016-07-19 DIAGNOSIS — E6609 Other obesity due to excess calories: Secondary | ICD-10-CM

## 2016-07-19 DIAGNOSIS — I10 Essential (primary) hypertension: Secondary | ICD-10-CM

## 2016-07-19 DIAGNOSIS — Z1231 Encounter for screening mammogram for malignant neoplasm of breast: Secondary | ICD-10-CM

## 2016-07-19 DIAGNOSIS — Z6831 Body mass index (BMI) 31.0-31.9, adult: Secondary | ICD-10-CM

## 2016-07-19 DIAGNOSIS — M7989 Other specified soft tissue disorders: Secondary | ICD-10-CM | POA: Diagnosis not present

## 2016-07-19 LAB — MICROALBUMIN, URINE: MICROALB UR: 1.9 mg/dL

## 2016-07-19 MED ORDER — HYDROCHLOROTHIAZIDE 25 MG PO TABS: 25.0000 mg | ORAL_TABLET | Freq: Every day | ORAL | 1 refills | Status: DC

## 2016-07-19 MED ORDER — ADULT BLOOD PRESSURE CUFF LG KIT: 1.0000 [IU] | PACK | Freq: Once | 0 refills | Status: AC

## 2016-07-19 MED FILL — HYDROCHLOROTHIAZIDE 25 MG T: 25 | 90 days supply | Qty: 90 | Fill #0

## 2016-07-19 NOTE — Patient Instructions (Addendum)
IF you received an x-ray today, you will receive an invoice from [facility]. Please contact [facility] at [phone] with questions or concerns regarding your invoice.   IF you received labwork today, you will receive an invoice from [employer]. Please contact [facility] at [phone] with questions or concerns regarding your invoice.   Our billing staff will not be able to assist you with questions regarding bills from these companies.  You will be contacted with the lab results as soon as they are available. The fastest way to get your results is to activate your My Chart account. Instructions are located on the last page of this paperwork. If you have not heard from [place] regarding the results in 2 weeks, please contact this office.     How to Take Your Blood Pressure HOW DO I GET A BLOOD PRESSURE MACHINE?  You can buy an electronic home blood pressure machine at your local pharmacy. Insurance will sometimes cover the cost if you have a prescription.  Ask your doctor what type of machine is best for you. There are different machines for your arm and your wrist.  If you decide to buy a machine to check your blood pressure on your arm, first check the size of your arm so you can buy the right size cuff. To check the size of your arm:   Use a measuring tape that shows both inches and centimeters.   Wrap the measuring tape around the upper-middle part of your arm. You may need someone to help you measure.   Write down your arm measurement in both inches and centimeters.   To measure your blood pressure correctly, it is important to have the right size cuff.   If your arm is up to 13 inches (up to 34 centimeters), get an adult cuff size.  If your arm is 13 to 17 inches (35 to 44 centimeters), get a large adult cuff size.    If your arm is 17 to 20 inches (45 to 52 centimeters), get an adult thigh cuff.  WHAT DO THE NUMBERS  MEAN?   There are two numbers that make up your blood pressure. For example: 120/80.  The first number (120 in our example) is called the "systolic pressure." It is a measure of the pressure in your blood vessels when your heart is pumping blood.  The second number (80 in our example) is called the "diastolic pressure." It is a measure of the pressure in your blood vessels when your heart is resting between beats.  Your doctor will tell you what your blood pressure should be. WHAT SHOULD I DO BEFORE I CHECK MY BLOOD PRESSURE?   Try to rest or relax for at least 30 minutes before you check your blood pressure.  Do not smoke.  Do not have any drinks with caffeine, such as:  Soda.  Coffee.  Tea.  Check your blood pressure in a quiet room.  Sit down and stretch out your arm on a table. Keep your arm at about the level of your heart. Let your arm relax.  Make sure that your legs are not crossed. HOW DO I CHECK MY BLOOD PRESSURE?  Follow the directions that came with your machine.  Make sure you remove any tight-fitting clothing from your arm or wrist. Wrap the cuff around your upper arm or wrist. You should be able to fit a finger between the cuff and your arm. If you cannot fit a  finger between the cuff and your arm, it is too tight and should be removed and rewrapped.  Some units require you to manually pump up the arm cuff.  Automatic units inflate the cuff when you press a button.  Cuff deflation is automatic in both models.  After the cuff is inflated, the unit measures your blood pressure and pulse. The readings are shown on a monitor. Hold still and breathe normally while the cuff is inflated.  Getting a reading takes less than a minute.  Some models store readings in a memory. Some provide a printout of readings. If your machine does not store your readings, keep a written record.  Take readings with you to your next visit with your doctor.   This information is not  intended to replace advice given to you by your health care provider. Make sure you discuss any questions you have with your health care provider.   Document Released: 08/12/2008 Document Revised: 09/20/2014 Document Reviewed: 10/25/2013 Elsevier Interactive Patient Education [employer].

## 2016-07-19 NOTE — Progress Notes (Signed)
Chief Complaint  Patient presents with  . BP issues    per pt" has been running high"    HPI   Essential Hypertension She reports that she got a reading at [facility] 149/85 2 days ago She reports that she has noticed her pressures being higher at doctors offices as well BP Readings from Last 3 Encounters:  07/19/16 128/90  06/01/16 141/79  05/20/16 (!) (P) 159/94   She reports that she reports that she checked her blood pressure despite taking Toprol for SVT She takes 2 tablets a day of Toprol. She reports that she took HCTZ as needed for her lower extremity edema.  She has not had any hctz for a while. She denies dizziness, chest pain and fatigue.    Component     Latest Ref Rng & Units 05/14/2016  Sodium     135 - 145 mmol/L 139  Potassium     3.5 - 5.1 mmol/L 4.1  Chloride     101 - 111 mmol/L 104  CO2     22 - 32 mmol/L 27  Glucose     65 - 99 mg/dL 100 (H)  BUN     6 - 20 mg/dL 11  Creatinine     0.44 - 1.00 mg/dL 0.73  Calcium     8.9 - 10.3 mg/dL 9.3  EGFR (Non-African Amer.)     >60 mL/min >60  EGFR (African American)     >60 mL/min >60  Anion gap     5 - 15 8     Past Medical History:  Diagnosis Date  . Anxiety   . Arthritis   . Congenital patella maltracking right knee  . Depression   . Diverticulitis    perforated; required partial colectomy  . GERD (gastroesophageal reflux disease)    takes Zantac daily  . Perforated diverticulitis   . Peripheral edema    takes HCTZ daily as needed  . Pleural effusion, bilateral 11/28/11  . Tachyarrhythmia    s/p ablation-takes Metoprolol daily    Current Outpatient Prescriptions  Medication Sig Dispense Refill  . ALPRAZolam (XANAX) 1 MG tablet Take 1 tablet (1 mg total) by mouth at bedtime as needed for sleep. 90 tablet 1  . HYDROcodone-acetaminophen (NORCO/VICODIN) 5-325 MG tablet Take 1 tablet by mouth every 12 (twelve) hours as needed. 4 tablet 0  . ibuprofen (ADVIL,MOTRIN) 600 MG tablet Take 1 tablet  (600 mg total) by mouth every 6 (six) hours as needed. 30 tablet 0  . metoprolol succinate (TOPROL-XL) 25 MG 24 hr tablet TAKE 1 TABLET BY MOUTH DAILY. (Patient taking differently: Take 25 mg by mouth daily) 90 tablet 0  . Multiple Vitamin (MULTIVITAMIN) tablet Take 1 tablet by mouth every evening.     . raloxifene (EVISTA) 60 MG tablet Take 60 mg by mouth daily.    . ranitidine (ZANTAC) 150 MG tablet Take 150 mg by mouth every evening.    Marland Kitchen acetaminophen (TYLENOL) 325 MG tablet Take 325-650 mg by mouth every 4 (four) hours as needed for moderate pain or headache.    . Blood Pressure Monitoring (ADULT BLOOD PRESSURE CUFF LG) KIT 1 Units by Does not apply route once. 1 each 0  . hydrochlorothiazide (HYDRODIURIL) 25 MG tablet Take 1 tablet (25 mg total) by mouth daily. 90 tablet 1  . methocarbamol (ROBAXIN) 500 MG tablet Take 1 tablet (500 mg total) by mouth 2 (two) times daily. (Patient not taking: Reported on 07/19/2016) 20 tablet 0  .  mupirocin ointment (BACTROBAN) 2 % Apply 1 application topically 3 (three) times daily. (Patient not taking: Reported on 07/19/2016) 22 g 1   No current facility-administered medications for this visit.     Allergies:  Allergies  Allergen Reactions  . No Known Allergies     Past Surgical History:  Procedure Laterality Date  . CARDIAC ELECTROPHYSIOLOGY STUDY AND ABLATION  2006   tachycardia  . CLOSED REDUCTION NASAL FRACTURE N/A 05/20/2016   Procedure: CLOSED REDUCTION NASAL FRACTURE AND REPAIR OF NOSE AND LIP LACERATION;  Surgeon: Loel Lofty Dillingham, DO;  Location: [place];  Service: Plastics;  Laterality: N/A;  . COLON SURGERY    . [place]  . EXPLORATORY LAPAROTOMY  GYN fertility 1990's  . EYE SURGERY    . GANGLION CYST EXCISION Left 1997  . HERNIA REPAIR  1988  . KNEE SURGERY Right 2012   lateral release  . LASIK    . left small fingernail removed  2006  . PARTIAL COLECTOMY  01/28/12  . PILONIDAL CYST EXCISION  1983  .  RHINOPLASTY    . SHOULDER ARTHROSCOPY W/ ROTATOR CUFF REPAIR Right 2010 repair bicep    Social History   Social History  . Marital status: Married    Spouse name: N/A  . Number of children: N/A  . Years of education: N/A   Social History Main Topics  . Smoking status: Former Smoker    Packs/day: 0.50    Years: 32.00    Types: Cigarettes  . Smokeless tobacco: Never Used     Comment: hasn't smoked since 05/10/16  . Alcohol use 4.2 - 8.4 oz/week    7 - 14 Glasses of wine per week  . Drug use: No  . Sexual activity: Not Asked   Other Topics Concern  . None   Social History Narrative  . None    ROS  Objective: Vitals:   07/19/16 0915  BP: 128/90  Pulse: 76  Resp: 16  Temp: 98 F (36.7 C)  TempSrc: Oral  SpO2: 97%  Weight: 193 lb 9.6 oz (87.8 kg)  Height: _0  (1.676 m)  Body mass index is 31.25 kg/m.   Physical Exam  Constitutional: She is oriented to person, place, and time. She appears well-developed and well-nourished.  HENT:  Head: Normocephalic and atraumatic.  Eyes: Conjunctivae and EOM are normal.  Cardiovascular: Normal rate, regular rhythm and normal heart sounds.   No murmur heard. Pulmonary/Chest: Effort normal and breath sounds normal. No respiratory distress. She has no wheezes.  Musculoskeletal: Normal range of motion. She exhibits no edema.  Neurological: She is alert and oriented to person, place, and time.  Skin: Skin is warm. No erythema.  Psychiatric: She has a normal mood and affect. Her behavior is normal. Judgment and thought content normal.    Assessment and Plan Clarece was seen today for bp issues.  Diagnoses and all orders for this visit:  Essential hypertension-  deterioration Advised that she should resume hctz -     Microalbumin, urine  Leg swelling Will refill hctz daily -     hydrochlorothiazide (HYDRODIURIL) 25 MG tablet; Take 1 tablet (25 mg total) by mouth daily.  Other orders -     Blood Pressure Monitoring  (ADULT BLOOD PRESSURE CUFF LG) KIT; 1 Units by Does not apply route once.  Obesity -  Discussed dash diet and weight loss   Marvalene Barrett A Hyatt Capobianco

## 2016-07-24 ENCOUNTER — Ambulatory Visit (HOSPITAL_BASED_OUTPATIENT_CLINIC_OR_DEPARTMENT_OTHER)
Admission: RE | Admit: 2016-07-24 | Discharge: 2016-07-24 | Disposition: A | Discharge status: Home / Self Care | Payer: 59 | Source: Ambulatory Visit | Attending: Obstetrics & Gynecology | Admitting: Obstetrics & Gynecology

## 2016-07-24 DIAGNOSIS — Z1231 Encounter for screening mammogram for malignant neoplasm of breast: Secondary | ICD-10-CM | POA: Diagnosis not present

## 2016-07-28 DIAGNOSIS — M2241 Chondromalacia patellae, right knee: Secondary | ICD-10-CM | POA: Diagnosis not present

## 2016-07-28 DIAGNOSIS — M25561 Pain in right knee: Secondary | ICD-10-CM | POA: Diagnosis not present

## 2016-07-28 DIAGNOSIS — Z79891 Long term (current) use of opiate analgesic: Secondary | ICD-10-CM | POA: Diagnosis not present

## 2016-07-28 DIAGNOSIS — G8929 Other chronic pain: Secondary | ICD-10-CM | POA: Diagnosis not present

## 2016-08-09 ENCOUNTER — Other Ambulatory Visit: Payer: Self-pay | Admitting: Physician Assistant

## 2016-08-09 DIAGNOSIS — I10 Essential (primary) hypertension: Secondary | ICD-10-CM

## 2016-08-09 MED FILL — RALOXIFENE HCL 60 MG TABLET: 60 | 30 days supply | Qty: 30 | Fill #11

## 2016-08-11 MED FILL — METOPROLOL SUCC ER 25 MG TA: 25 | 90 days supply | Qty: 90 | Fill #0

## 2016-08-11 NOTE — Telephone Encounter (Signed)
09/19/2015 last ov and lab

## 2016-08-13 DIAGNOSIS — L905 Scar conditions and fibrosis of skin: Secondary | ICD-10-CM | POA: Diagnosis not present

## 2016-08-13 DIAGNOSIS — S01511S Laceration without foreign body of lip, sequela: Secondary | ICD-10-CM | POA: Diagnosis not present

## 2016-08-13 MED FILL — HYDROCODON-APAP 5-325: 5-325 | 30 days supply | Qty: 60 | Fill #0

## 2016-08-30 MED FILL — ALPRAZolam 1 MG TABS: 1 | 90 days supply | Qty: 180 | Fill #0

## 2016-09-03 DIAGNOSIS — H10413 Chronic giant papillary conjunctivitis, bilateral: Secondary | ICD-10-CM | POA: Diagnosis not present

## 2016-09-10 MED FILL — HYDROCODON-APAP 5-325: 5-325 | 30 days supply | Qty: 60 | Fill #0

## 2016-10-04 DIAGNOSIS — H04123 Dry eye syndrome of bilateral lacrimal glands: Secondary | ICD-10-CM | POA: Diagnosis not present

## 2016-10-04 DIAGNOSIS — H16223 Keratoconjunctivitis sicca, not specified as Sjogren's, bilateral: Secondary | ICD-10-CM | POA: Diagnosis not present

## 2016-10-04 DIAGNOSIS — H10413 Chronic giant papillary conjunctivitis, bilateral: Secondary | ICD-10-CM | POA: Diagnosis not present

## 2016-10-05 MED FILL — LOTEMAX 0.5% GEL: 0.5 | 12 days supply | Qty: 5 | Fill #0

## 2016-10-08 MED FILL — HYDROCODON-APAP 5-325: 5-325 | 30 days supply | Qty: 60 | Fill #0

## 2016-10-11 MED FILL — HYDROCHLOROTHIAZIDE 25 MG T: 25 | 90 days supply | Qty: 90 | Fill #1

## 2016-10-12 ENCOUNTER — Ambulatory Visit (INDEPENDENT_AMBULATORY_CARE_PROVIDER_SITE_OTHER): Payer: 59 | Admitting: Family Medicine

## 2016-10-12 VITALS — BP 138/88 | HR 101 | Temp 97.6°F | Ht 66.0 in | Wt 198.2 lb

## 2016-10-12 DIAGNOSIS — R05 Cough: Secondary | ICD-10-CM

## 2016-10-12 DIAGNOSIS — J069 Acute upper respiratory infection, unspecified: Secondary | ICD-10-CM | POA: Diagnosis not present

## 2016-10-12 DIAGNOSIS — J22 Unspecified acute lower respiratory infection: Secondary | ICD-10-CM | POA: Diagnosis not present

## 2016-10-12 DIAGNOSIS — R059 Cough, unspecified: Secondary | ICD-10-CM

## 2016-10-12 MED ORDER — HYDROCODONE-HOMATROPINE 5-1.5 MG/5ML PO SYRP: ORAL_SOLUTION | ORAL | 0 refills | Status: DC

## 2016-10-12 MED ORDER — AZITHROMYCIN 250 MG PO TABS: ORAL_TABLET | ORAL | 0 refills | Status: DC

## 2016-10-12 MED FILL — AZITHROMYCIN 250 MG TABLET: 250 | 5 days supply | Qty: 6 | Fill #0

## 2016-10-12 MED FILL — HYDROCODONE-HOMATROPINE SYR: 5-1.5 | 24 days supply | Qty: 120 | Fill #0

## 2016-10-12 NOTE — Progress Notes (Addendum)
By signing my name below, I, Mesha Guinyard, attest that this documentation has been prepared under the direction and in the presence of Merri Ray, MD.  Electronically Signed: Verlee Monte, Medical Scribe. 10/12/16. 12:33 PM.  Subjective:    Patient ID: Amanda [name], female    DOB: May 23, 1961, 56 y.o.   MRN: [id number]  HPI Chief Complaint  Patient presents with  . Cough    X 1 week  . Sore Throat    X 2-3 days    HPI Comments: Amanda [name] is a 56 y.o. female who presents to the [facility] complaining of productive cough onset 1 week. Initially the cough had green-yellow colored sputum, but it's now clear. She reports associated sxs of sore throat with swallowing, and sleep disturbance from her cough. Pt has been taking mucinex, alka-seltzer with little relief of her sxs. Pt reports some sickness at work, but isn't sure of flu. Denies fever, SOB, and HA.  Patient Active Problem List   Diagnosis Date Noted  . Screening for lipid disorders 04/21/2015  . Insomnia 07/05/2014  . Essential hypertension 07/05/2014  . Arthralgia 07/05/2014   Past Medical History:  Diagnosis Date  . Anxiety   . Arthritis   . Congenital patella maltracking right knee  . Depression   . Diverticulitis    perforated; required partial colectomy  . GERD (gastroesophageal reflux disease)    takes Zantac daily  . Perforated diverticulitis   . Peripheral edema    takes HCTZ daily as needed  . Pleural effusion, bilateral 11/28/11  . Tachyarrhythmia    s/p ablation-takes Metoprolol daily   Past Surgical History:  Procedure Laterality Date  . CARDIAC ELECTROPHYSIOLOGY STUDY AND ABLATION  2006   tachycardia  . CLOSED REDUCTION NASAL FRACTURE N/A 05/20/2016   Procedure: CLOSED REDUCTION NASAL FRACTURE AND REPAIR OF NOSE AND LIP LACERATION;  Surgeon: Loel Lofty Dillingham, DO;  Location: [place];  Service: Plastics;  Laterality: N/A;  . COLON SURGERY    . COSMETIC SURGERY   05/2016  . [place]  . EXPLORATORY LAPAROTOMY  GYN fertility 1990's  . EYE SURGERY    . GANGLION CYST EXCISION Left 1997  . HERNIA REPAIR  1988  . KNEE SURGERY Right 2012   lateral release  . LASIK    . left small fingernail removed  2006  . PARTIAL COLECTOMY  01/28/12  . PILONIDAL CYST EXCISION  1983  . RHINOPLASTY    . SHOULDER ARTHROSCOPY W/ ROTATOR CUFF REPAIR Right 2010 repair bicep   Allergies  Allergen Reactions  . No Known Allergies    Prior to Admission medications   Medication Sig Start Date End Date Taking? Authorizing Provider  ALPRAZolam Duanne Moron) 1 MG tablet Take 1 tablet (1 mg total) by mouth at bedtime as needed for sleep. 12/20/11  Yes Elige Radon, MD  hydrochlorothiazide (HYDRODIURIL) 25 MG tablet Take 1 tablet (25 mg total) by mouth daily. 07/19/16  Yes Forrest Moron, MD  HYDROcodone-acetaminophen (NORCO/VICODIN) 5-325 MG tablet Take 1 tablet by mouth every 12 (twelve) hours as needed. 06/01/16  Yes Varney Biles, MD  ibuprofen (ADVIL,MOTRIN) 600 MG tablet Take 1 tablet (600 mg total) by mouth every 6 (six) hours as needed. 06/01/16  Yes Varney Biles, MD  metoprolol succinate (TOPROL-XL) 25 MG 24 hr tablet TAKE 1 TABLET BY MOUTH DAILY. OFFICE VISIT NEEDED FOR REFILLS 08/11/16  Yes Forrest Moron, MD  Multiple Vitamin (MULTIVITAMIN) tablet Take  1 tablet by mouth every evening.    Yes Historical Provider, MD  raloxifene (EVISTA) 60 MG tablet Take 60 mg by mouth daily.   Yes Historical Provider, MD  ranitidine (ZANTAC) 150 MG tablet Take 150 mg by mouth every evening.   Yes Historical Provider, MD  acetaminophen (TYLENOL) 325 MG tablet Take 325-650 mg by mouth every 4 (four) hours as needed for moderate pain or headache.    Historical Provider, MD  methocarbamol (ROBAXIN) 500 MG tablet Take 1 tablet (500 mg total) by mouth 2 (two) times daily. Patient not taking: Reported on 10/12/2016 06/01/16   Varney Biles, MD  mupirocin ointment (BACTROBAN)  2 % Apply 1 application topically 3 (three) times daily. Patient not taking: Reported on 10/12/2016 05/14/16   Tereasa Coop, PA-C   Social History   Social History  . Marital status: Married    Spouse name: N/A  . Number of children: N/A  . Years of education: N/A   Occupational History  . Not on file.   Social History Main Topics  . Smoking status: Former Smoker    Packs/day: 0.50    Years: 32.00    Types: Cigarettes  . Smokeless tobacco: Never Used     Comment: hasn't smoked since 05/10/16  . Alcohol use 4.2 - 8.4 oz/week    7 - 14 Glasses of wine per week  . Drug use: No  . Sexual activity: Not on file   Other Topics Concern  . Not on file   Social History Narrative  . No narrative on file   Review of Systems  Constitutional: Negative for fever.  HENT: Positive for sore throat.   Respiratory: Positive for cough. Negative for shortness of breath.   Neurological: Negative for headaches.  Psychiatric/Behavioral: Positive for sleep disturbance.    Objective:  Physical Exam  Constitutional: She is oriented to person, place, and time. She appears well-developed and well-nourished. No distress.  HENT:  Head: Normocephalic and atraumatic.  Right Ear: Hearing, tympanic membrane, external ear and ear canal normal.  Left Ear: Hearing, tympanic membrane, external ear and ear canal normal.  Nose: Nose normal. Right sinus exhibits no maxillary sinus tenderness and no frontal sinus tenderness. Left sinus exhibits no maxillary sinus tenderness and no frontal sinus tenderness.  Mouth/Throat: Oropharynx is clear and moist. No oropharyngeal exudate.  Moist oral mucosa Sinuses nontender  Eyes: Conjunctivae and EOM are normal. Pupils are equal, round, and reactive to light.  Cardiovascular: Normal rate, regular rhythm, normal heart sounds and intact distal pulses.  Exam reveals no gallop and no friction rub.   No murmur heard. Pulmonary/Chest: Effort normal and breath sounds normal.  No respiratory distress. She has no wheezes. She has no rhonchi. She has no rales.  Lymphadenopathy:    She has no cervical adenopathy.    She has no axillary adenopathy.  No lymph adenopathy  Neurological: She is alert and oriented to person, place, and time.  Skin: Skin is warm and dry. No rash noted.  Psychiatric: She has a normal mood and affect. Her behavior is normal.  Vitals reviewed.  BP 138/88   Pulse (!) 101   Temp 97.6 F (36.4 C) (Oral)   Ht 5\' 6"  (1.676 m)   Wt 198 lb 3.2 oz (89.9 kg)   LMP 12/17/2013   SpO2 96%   BMI 31.99 kg/m   Assessment & Plan:    Amanda [name] is a 56 y.o. female Cough - Plan: azithromycin (ZITHROMAX) 250  MG tablet  LRTI (lower respiratory tract infection) - Plan: azithromycin (ZITHROMAX) 250 MG tablet  Acute upper respiratory infection  Suspected viral illness with persistent cough versus early bronchitis. Continue symptomatic care discussed, hydrocodone cough syrup at night if needed, then if cough not continuing to improve in the next 3-4 days, can fill prescription for azithromycin. Side effects discussed, RTC precautions given.   Meds ordered this encounter  Medications  . HYDROcodone-homatropine (HYCODAN) 5-1.5 MG/5ML syrup    Sig: 15m by mouth a bedtime as needed for cough.    Dispense:  120 mL    Refill:  0  . azithromycin (ZITHROMAX) 250 MG tablet    Sig: Take 2 pills by mouth on day 1, then 1 pill by mouth per day on days 2 through 5.    Dispense:  6 tablet    Refill:  0   Patient Instructions   Cough is likely still due to a virus. However if that is not continuing to improve this week, I did print a prescription for an antibiotic to cover for bronchitis or less likely walking pneumonia. Okay to continue Mucinex during the day, hydrocodone cough syrup if needed at night. Return to the clinic or go to the nearest emergency room if any of your symptoms worsen or new symptoms occur.   Cough, Adult Coughing is a reflex  that clears your throat and your airways. Coughing helps to heal and protect your lungs. It is normal to cough occasionally, but a cough that happens with other symptoms or lasts a long time may be a sign of a condition that needs treatment. A cough may last only 2-3 weeks (acute), or it may last longer than 8 weeks (chronic). What are the causes? Coughing is commonly caused by:  Breathing in substances that irritate your lungs.  A viral or bacterial respiratory infection.  Allergies.  Asthma.  Postnasal drip.  Smoking.  Acid backing up from the stomach into the esophagus (gastroesophageal reflux).  Certain medicines.  Chronic lung problems, including COPD (or rarely, lung cancer).  Other medical conditions such as heart failure. Follow these instructions at home: Pay attention to any changes in your symptoms. Take these actions to help with your discomfort:  Take medicines only as told by your health care provider.  If you were prescribed an antibiotic medicine, take it as told by your health care provider. Do not stop taking the antibiotic even if you start to feel better.  Talk with your health care provider before you take a cough suppressant medicine.  Drink enough fluid to keep your urine clear or pale yellow.  If the air is dry, use a cold steam vaporizer or humidifier in your bedroom or your home to help loosen secretions.  Avoid anything that causes you to cough at work or at home.  If your cough is worse at night, try sleeping in a semi-upright position.  Avoid cigarette smoke. If you smoke, quit smoking. If you need help quitting, ask your health care provider.  Avoid caffeine.  Avoid alcohol.  Rest as needed. Contact a health care provider if:  You have new symptoms.  You cough up pus.  Your cough does not get better after 2-3 weeks, or your cough gets worse.  You cannot control your cough with suppressant medicines and you are losing sleep.  You  develop pain that is getting worse or pain that is not controlled with pain medicines.  You have a fever.  You have unexplained  weight loss.  You have night sweats. Get help right away if:  You cough up blood.  You have difficulty breathing.  Your heartbeat is very fast. This information is not intended to replace advice given to you by your health care provider. Make sure you discuss any questions you have with your health care provider. Document Released: 02/26/2011 Document Revised: 02/05/2016 Document Reviewed: 11/06/2014 Elsevier Interactive Patient Education  2017 [employer].     IF you received an x-ray today, you will receive an invoice from [facility]. Please contact [facility] at [phone] with questions or concerns regarding your invoice.   IF you received labwork today, you will receive an invoice from [place]. Please contact [facility] at [phone] with questions or concerns regarding your invoice.   Our billing staff will not be able to assist you with questions regarding bills from these companies.  You will be contacted with the lab results as soon as they are available. The fastest way to get your results is to activate your My Chart account. Instructions are located on the last page of this paperwork. If you have not heard from [place] regarding the results in 2 weeks, please contact this office.      I personally performed the services described in this documentation, which was scribed in my presence. The recorded information has been reviewed and considered for accuracy and completeness, addended by me as needed, and agree with information above.  Signed,   Merri Ray, MD [facility] [place].  10/12/16 1:14 PM

## 2016-10-12 NOTE — Progress Notes (Signed)
   Amanda [name]  MRN: [id number] DOB: April 11, 1961  Subjective:  Pt presents to clinic  Review of Systems  Patient Active Problem List   Diagnosis Date Noted  . Screening for lipid disorders 04/21/2015  . Insomnia 07/05/2014  . Essential hypertension 07/05/2014  . Arthralgia 07/05/2014    Current Outpatient Prescriptions on File Prior to Visit  Medication Sig Dispense Refill  . ALPRAZolam (XANAX) 1 MG tablet Take 1 tablet (1 mg total) by mouth at bedtime as needed for sleep. 90 tablet 1  . hydrochlorothiazide (HYDRODIURIL) 25 MG tablet Take 1 tablet (25 mg total) by mouth daily. 90 tablet 1  . HYDROcodone-acetaminophen (NORCO/VICODIN) 5-325 MG tablet Take 1 tablet by mouth every 12 (twelve) hours as needed. 4 tablet 0  . ibuprofen (ADVIL,MOTRIN) 600 MG tablet Take 1 tablet (600 mg total) by mouth every 6 (six) hours as needed. 30 tablet 0  . metoprolol succinate (TOPROL-XL) 25 MG 24 hr tablet TAKE 1 TABLET BY MOUTH DAILY. OFFICE VISIT NEEDED FOR REFILLS 90 tablet 0  . Multiple Vitamin (MULTIVITAMIN) tablet Take 1 tablet by mouth every evening.     . raloxifene (EVISTA) 60 MG tablet Take 60 mg by mouth daily.    . ranitidine (ZANTAC) 150 MG tablet Take 150 mg by mouth every evening.    Marland Kitchen acetaminophen (TYLENOL) 325 MG tablet Take 325-650 mg by mouth every 4 (four) hours as needed for moderate pain or headache.    . methocarbamol (ROBAXIN) 500 MG tablet Take 1 tablet (500 mg total) by mouth 2 (two) times daily. (Patient not taking: Reported on 10/12/2016) 20 tablet 0  . mupirocin ointment (BACTROBAN) 2 % Apply 1 application topically 3 (three) times daily. (Patient not taking: Reported on 10/12/2016) 22 g 1   No current facility-administered medications on file prior to visit.     Allergies  Allergen Reactions  . No Known Allergies     Pt patients past, family and social history were reviewed and updated.   Objective:  BP 138/88   Pulse (!) 101   Temp 97.6 F (36.4 C) (Oral)    Ht 5\' 6"  (1.676 m)   Wt 198 lb 3.2 oz (89.9 kg)   LMP 12/17/2013   SpO2 (!) 69%   BMI 31.99 kg/m   Physical Exam  Assessment and Plan :  No diagnosis found.  Windell Hummingbird PA-C  [facility] 10/12/2016 12:30 PM

## 2016-10-12 NOTE — Patient Instructions (Addendum)
Cough is likely still due to a virus. However if that is not continuing to improve this week, I did print a prescription for an antibiotic to cover for bronchitis or less likely walking pneumonia. Okay to continue Mucinex during the day, hydrocodone cough syrup if needed at night. Return to the clinic or go to the nearest emergency room if any of your symptoms worsen or new symptoms occur.   Cough, Adult Coughing is a reflex that clears your throat and your airways. Coughing helps to heal and protect your lungs. It is normal to cough occasionally, but a cough that happens with other symptoms or lasts a long time may be a sign of a condition that needs treatment. A cough may last only 2-3 weeks (acute), or it may last longer than 8 weeks (chronic). What are the causes? Coughing is commonly caused by:  Breathing in substances that irritate your lungs.  A viral or bacterial respiratory infection.  Allergies.  Asthma.  Postnasal drip.  Smoking.  Acid backing up from the stomach into the esophagus (gastroesophageal reflux).  Certain medicines.  Chronic lung problems, including COPD (or rarely, lung cancer).  Other medical conditions such as heart failure. Follow these instructions at home: Pay attention to any changes in your symptoms. Take these actions to help with your discomfort:  Take medicines only as told by your health care provider.  If you were prescribed an antibiotic medicine, take it as told by your health care provider. Do not stop taking the antibiotic even if you start to feel better.  Talk with your health care provider before you take a cough suppressant medicine.  Drink enough fluid to keep your urine clear or pale yellow.  If the air is dry, use a cold steam vaporizer or humidifier in your bedroom or your home to help loosen secretions.  Avoid anything that causes you to cough at work or at home.  If your cough is worse at night, try sleeping in a  semi-upright position.  Avoid cigarette smoke. If you smoke, quit smoking. If you need help quitting, ask your health care provider.  Avoid caffeine.  Avoid alcohol.  Rest as needed. Contact a health care provider if:  You have new symptoms.  You cough up pus.  Your cough does not get better after 2-3 weeks, or your cough gets worse.  You cannot control your cough with suppressant medicines and you are losing sleep.  You develop pain that is getting worse or pain that is not controlled with pain medicines.  You have a fever.  You have unexplained weight loss.  You have night sweats. Get help right away if:  You cough up blood.  You have difficulty breathing.  Your heartbeat is very fast. This information is not intended to replace advice given to you by your health care provider. Make sure you discuss any questions you have with your health care provider. Document Released: 02/26/2011 Document Revised: 02/05/2016 Document Reviewed: 11/06/2014 Elsevier Interactive Patient Education  2017 [employer].     IF you received an x-ray today, you will receive an invoice from [facility]. Please contact [facility] at [phone] with questions or concerns regarding your invoice.   IF you received labwork today, you will receive an invoice from [facility]. Please contact [facility] at [phone] with questions or concerns regarding your invoice.   Our billing staff will not be able to assist you with questions regarding bills from these companies.  You will be contacted with  the lab results as soon as they are available. The fastest way to get your results is to activate your My Chart account. Instructions are located on the last page of this paperwork. If you have not heard from [place] regarding the results in 2 weeks, please contact this office.

## 2016-10-13 DIAGNOSIS — H10413 Chronic giant papillary conjunctivitis, bilateral: Secondary | ICD-10-CM | POA: Diagnosis not present

## 2016-10-13 DIAGNOSIS — H16223 Keratoconjunctivitis sicca, not specified as Sjogren's, bilateral: Secondary | ICD-10-CM | POA: Diagnosis not present

## 2016-10-13 DIAGNOSIS — H04123 Dry eye syndrome of bilateral lacrimal glands: Secondary | ICD-10-CM | POA: Diagnosis not present

## 2016-10-13 MED FILL — XIIDRA 5% EYE DROPS: 5 | 30 days supply | Qty: 60 | Fill #0

## 2016-10-14 MED FILL — RALOXIFENE HCL 60 MG TABLET: 60 | 30 days supply | Qty: 30 | Fill #0

## 2016-10-22 DIAGNOSIS — K130 Diseases of lips: Secondary | ICD-10-CM | POA: Diagnosis not present

## 2016-10-22 DIAGNOSIS — S01511D Laceration without foreign body of lip, subsequent encounter: Secondary | ICD-10-CM | POA: Diagnosis not present

## 2016-11-04 DIAGNOSIS — Z1159 Encounter for screening for other viral diseases: Secondary | ICD-10-CM | POA: Diagnosis not present

## 2016-11-04 DIAGNOSIS — Z113 Encounter for screening for infections with a predominantly sexual mode of transmission: Secondary | ICD-10-CM | POA: Diagnosis not present

## 2016-11-04 DIAGNOSIS — Z6831 Body mass index (BMI) 31.0-31.9, adult: Secondary | ICD-10-CM | POA: Diagnosis not present

## 2016-11-04 DIAGNOSIS — Z01419 Encounter for gynecological examination (general) (routine) without abnormal findings: Secondary | ICD-10-CM | POA: Diagnosis not present

## 2016-11-04 DIAGNOSIS — Z114 Encounter for screening for human immunodeficiency virus [HIV]: Secondary | ICD-10-CM | POA: Diagnosis not present

## 2016-11-05 MED FILL — HYDROCODON-APAP 5-325: 5-325 | 30 days supply | Qty: 60 | Fill #0

## 2016-11-08 DIAGNOSIS — K130 Diseases of lips: Secondary | ICD-10-CM | POA: Diagnosis not present

## 2016-11-17 DIAGNOSIS — Z79891 Long term (current) use of opiate analgesic: Secondary | ICD-10-CM | POA: Diagnosis not present

## 2016-11-17 DIAGNOSIS — M25561 Pain in right knee: Secondary | ICD-10-CM | POA: Diagnosis not present

## 2016-11-17 DIAGNOSIS — G8929 Other chronic pain: Secondary | ICD-10-CM | POA: Diagnosis not present

## 2016-11-24 ENCOUNTER — Other Ambulatory Visit: Payer: Self-pay | Admitting: Family Medicine

## 2016-11-24 DIAGNOSIS — I10 Essential (primary) hypertension: Secondary | ICD-10-CM

## 2016-11-24 MED FILL — METOPROLOL SUCC ER 25 MG TA: 25 | 30 days supply | Qty: 30 | Fill #0

## 2016-12-02 MED FILL — HYDROCODON-APAP 5-325: 5-325 | 30 days supply | Qty: 60 | Fill #0

## 2016-12-08 MED FILL — RALOXIFENE HCL 60 MG TABLET: 60 | 30 days supply | Qty: 30 | Fill #0

## 2016-12-29 MED FILL — ALPRAZolam 1 MG TABS: 1 | 90 days supply | Qty: 180 | Fill #0

## 2016-12-30 MED FILL — HYDROCODON-APAP 5-325: 5-325 | 30 days supply | Qty: 60 | Fill #0

## 2017-01-07 ENCOUNTER — Encounter: Payer: Self-pay | Admitting: Family Medicine

## 2017-01-07 ENCOUNTER — Ambulatory Visit (INDEPENDENT_AMBULATORY_CARE_PROVIDER_SITE_OTHER): Payer: 59 | Admitting: Family Medicine

## 2017-01-07 VITALS — BP 132/78 | HR 104 | Temp 98.2°F | Resp 16 | Ht 66.0 in | Wt 201.6 lb

## 2017-01-07 DIAGNOSIS — E6609 Other obesity due to excess calories: Secondary | ICD-10-CM

## 2017-01-07 DIAGNOSIS — I10 Essential (primary) hypertension: Secondary | ICD-10-CM | POA: Diagnosis not present

## 2017-01-07 DIAGNOSIS — Z6831 Body mass index (BMI) 31.0-31.9, adult: Secondary | ICD-10-CM | POA: Diagnosis not present

## 2017-01-07 DIAGNOSIS — R000 Tachycardia, unspecified: Secondary | ICD-10-CM | POA: Diagnosis not present

## 2017-01-07 MED ORDER — HYDROCHLOROTHIAZIDE 25 MG PO TABS: 25.0000 mg | ORAL_TABLET | Freq: Every day | ORAL | 1 refills | Status: DC

## 2017-01-07 MED ORDER — METOPROLOL SUCCINATE ER 25 MG PO TB24: 25.0000 mg | ORAL_TABLET | Freq: Every day | ORAL | 1 refills | Status: DC

## 2017-01-07 MED FILL — HYDROCHLOROTHIAZIDE 25 MG T: 25 | 90 days supply | Qty: 90 | Fill #0

## 2017-01-07 MED FILL — RALOXIFENE HCL 60 MG TABLET: 60 | 30 days supply | Qty: 30 | Fill #0

## 2017-01-07 MED FILL — METOPROLOL SUCC ER 25 MG TA: 25 | 90 days supply | Qty: 90 | Fill #0

## 2017-01-07 NOTE — Patient Instructions (Addendum)
   IF you received an x-ray today, you will receive an invoice from [facility]. Please contact [facility] at [phone] with questions or concerns regarding your invoice.   IF you received labwork today, you will receive an invoice from [facility]. Please contact [facility] at [phone] with questions or concerns regarding your invoice.   Our billing staff will not be able to assist you with questions regarding bills from these companies.  You will be contacted with the lab results as soon as they are available. The fastest way to get your results is to activate your My Chart account. Instructions are located on the last page of this paperwork. If you have not heard from us regarding the results in 2 weeks, please contact this office.     DASH Eating Plan DASH stands for "Dietary Approaches to Stop Hypertension." The DASH eating plan is a healthy eating plan that has been shown to reduce high blood pressure (hypertension). It may also reduce your risk for type 2 diabetes, heart disease, and stroke. The DASH eating plan may also help with weight loss. What are tips for following this plan? General guidelines   Avoid eating more than 2,300 mg (milligrams) of salt (sodium) a day. If you have hypertension, you may need to reduce your sodium intake to 1,500 mg a day.  Limit alcohol intake to no more than 1 drink a day for nonpregnant women and 2 drinks a day for men. One drink equals 12 oz of beer, 5 oz of wine, or 1 oz of hard liquor.  Work with your health care provider to maintain a healthy body weight or to lose weight. Ask what an ideal weight is for you.  Get at least 30 minutes of exercise that causes your heart to beat faster (aerobic exercise) most days of the week. Activities may include walking, swimming, or biking.  Work with your health care provider or diet and nutrition specialist (dietitian) to adjust your eating plan to your individual calorie  needs. Reading food labels   Check food labels for the amount of sodium per serving. Choose foods with less than 5 percent of the Daily Value of sodium. Generally, foods with less than 300 mg of sodium per serving fit into this eating plan.  To find whole grains, look for the word "whole" as the first word in the ingredient list. Shopping   Buy products labeled as "low-sodium" or "no salt added."  Buy fresh foods. Avoid canned foods and premade or frozen meals. Cooking   Avoid adding salt when cooking. Use salt-free seasonings or herbs instead of table salt or sea salt. Check with your health care provider or pharmacist before using salt substitutes.  Do not fry foods. Cook foods using healthy methods such as baking, boiling, grilling, and broiling instead.  Cook with heart-healthy oils, such as olive, canola, soybean, or sunflower oil. Meal planning    Eat a balanced diet that includes:  5 or more servings of fruits and vegetables each day. At each meal, try to fill half of your plate with fruits and vegetables.  Up to 6-8 servings of whole grains each day.  Less than 6 oz of lean meat, poultry, or fish each day. A 3-oz serving of meat is about the same size as a deck of cards. One egg equals 1 oz.  2 servings of low-fat dairy each day.  A serving of nuts, seeds, or beans 5 times each week.  Heart-healthy fats. Healthy fats called   Omega-3 fatty acids are found in foods such as flaxseeds and coldwater fish, like sardines, salmon, and mackerel.  Limit how much you eat of the following:  Canned or prepackaged foods.  Food that is high in trans fat, such as fried foods.  Food that is high in saturated fat, such as fatty meat.  Sweets, desserts, sugary drinks, and other foods with added sugar.  Full-fat dairy products.  Do not salt foods before eating.  Try to eat at least 2 vegetarian meals each week.  Eat more home-cooked food and less restaurant, buffet, and fast  food.  When eating at a restaurant, ask that your food be prepared with less salt or no salt, if possible. What foods are recommended? The items listed may not be a complete list. Talk with your dietitian about what dietary choices are best for you. Grains  Whole-grain or whole-wheat bread. Whole-grain or whole-wheat pasta. Brown rice. Oatmeal. Quinoa. Bulgur. Whole-grain and low-sodium cereals. Pita bread. Low-fat, low-sodium crackers. Whole-wheat flour tortillas. Vegetables  Fresh or frozen vegetables (raw, steamed, roasted, or grilled). Low-sodium or reduced-sodium tomato and vegetable juice. Low-sodium or reduced-sodium tomato sauce and tomato paste. Low-sodium or reduced-sodium canned vegetables. Fruits  All fresh, dried, or frozen fruit. Canned fruit in natural juice (without added sugar). Meat and other protein foods  Skinless chicken or turkey. Ground chicken or turkey. Pork with fat trimmed off. Fish and seafood. Egg whites. Dried beans, peas, or lentils. Unsalted nuts, nut butters, and seeds. Unsalted canned beans. Lean cuts of beef with fat trimmed off. Low-sodium, lean deli meat. Dairy  Low-fat (1%) or fat-free (skim) milk. Fat-free, low-fat, or reduced-fat cheeses. Nonfat, low-sodium ricotta or cottage cheese. Low-fat or nonfat yogurt. Low-fat, low-sodium cheese. Fats and oils  Soft margarine without trans fats. Vegetable oil. Low-fat, reduced-fat, or light mayonnaise and salad dressings (reduced-sodium). Canola, safflower, olive, soybean, and sunflower oils. Avocado. Seasoning and other foods  Herbs. Spices. Seasoning mixes without salt. Unsalted popcorn and pretzels. Fat-free sweets. What foods are not recommended? The items listed may not be a complete list. Talk with your dietitian about what dietary choices are best for you. Grains  Baked goods made with fat, such as croissants, muffins, or some breads. Dry pasta or rice meal packs. Vegetables  Creamed or fried vegetables.  Vegetables in a cheese sauce. Regular canned vegetables (not low-sodium or reduced-sodium). Regular canned tomato sauce and paste (not low-sodium or reduced-sodium). Regular tomato and vegetable juice (not low-sodium or reduced-sodium). Pickles. Olives. Fruits  Canned fruit in a light or heavy syrup. Fried fruit. Fruit in cream or butter sauce. Meat and other protein foods  Fatty cuts of meat. Ribs. Fried meat. Bacon. Sausage. Bologna and other processed lunch meats. Salami. Fatback. Hotdogs. Bratwurst. Salted nuts and seeds. Canned beans with added salt. Canned or smoked fish. Whole eggs or egg yolks. Chicken or turkey with skin. Dairy  Whole or 2% milk, cream, and half-and-half. Whole or full-fat cream cheese. Whole-fat or sweetened yogurt. Full-fat cheese. Nondairy creamers. Whipped toppings. Processed cheese and cheese spreads. Fats and oils  Butter. Stick margarine. Lard. Shortening. Ghee. Bacon fat. Tropical oils, such as coconut, palm kernel, or palm oil. Seasoning and other foods  Salted popcorn and pretzels. Onion salt, garlic salt, seasoned salt, table salt, and sea salt. Worcestershire sauce. Tartar sauce. Barbecue sauce. Teriyaki sauce. Soy sauce, including reduced-sodium. Steak sauce. Canned and packaged gravies. Fish sauce. Oyster sauce. Cocktail sauce. Horseradish that you find on the shelf. Ketchup. Mustard. Meat flavorings   and tenderizers. Bouillon cubes. Hot sauce and Tabasco sauce. Premade or packaged marinades. Premade or packaged taco seasonings. Relishes. Regular salad dressings. Where to find more information:  National Heart, Lung, and Blood Institute: www.nhlbi.nih.gov  American Heart Association: www.heart.org Summary  The DASH eating plan is a healthy eating plan that has been shown to reduce high blood pressure (hypertension). It may also reduce your risk for type 2 diabetes, heart disease, and stroke.  With the DASH eating plan, you should limit salt (sodium) intake  to 2,300 mg a day. If you have hypertension, you may need to reduce your sodium intake to 1,500 mg a day.  When on the DASH eating plan, aim to eat more fresh fruits and vegetables, whole grains, lean proteins, low-fat dairy, and heart-healthy fats.  Work with your health care provider or diet and nutrition specialist (dietitian) to adjust your eating plan to your individual calorie needs. This information is not intended to replace advice given to you by your health care provider. Make sure you discuss any questions you have with your health care provider. Document Released: 08/19/2011 Document Revised: 08/23/2016 Document Reviewed: 08/23/2016 Elsevier Interactive Patient Education  2017 [employer] Inc.  

## 2017-01-07 NOTE — Progress Notes (Signed)
Chief Complaint  Patient presents with  . Medication Refill    Metoprolol  . Medication Problem    Pt states HCTZ not working    HPI  Hypertension: Patient here for follow-up of elevated blood pressure. She is not exercising and is adherent to low salt diet.  Blood pressure is well controlled at home. Cardiac symptoms none. Patient denies chest pain, chest pressure/discomfort, exertional chest pressure/discomfort, fatigue, irregular heart beat, lower extremity edema, near-syncope, palpitations, syncope and tachypnea.  Cardiovascular risk factors: hypertension and obesity (BMI >= 30 kg/m2). Use of agents associated with hypertension: none. History of target organ damage: none. 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 Wt Readings from Last 3 Encounters:  01/07/17 201 lb 9.6 oz (91.4 kg)  10/12/16 198 lb 3.2 oz (89.9 kg)  07/19/16 193 lb 9.6 oz (87.8 kg)   Body mass index is 32.54 kg/m.  Tachyarrhythmia Pt reports that she ran out of her metoprolol Her pulse is higher but she does not have palpitations On metoprolol her pulse is 70s  Past Medical History:  Diagnosis Date  . Anxiety   . Arthritis   . Congenital patella maltracking right knee  . Depression   . Diverticulitis    perforated; required partial colectomy  . GERD (gastroesophageal reflux disease)    takes Zantac daily  . Perforated diverticulitis   . Peripheral edema    takes HCTZ daily as needed  . Pleural effusion, bilateral 11/28/11  . Tachyarrhythmia    s/p ablation-takes Metoprolol daily    Current Outpatient Prescriptions  Medication Sig Dispense Refill  . acetaminophen (TYLENOL) 325 MG tablet Take 325-650 mg by mouth every 4 (four) hours as needed for moderate pain or headache.    .  ALPRAZolam (XANAX) 1 MG tablet Take 1 tablet (1 mg total) by mouth at bedtime as needed for sleep. 90 tablet 1  . hydrochlorothiazide (HYDRODIURIL) 25 MG tablet Take 1 tablet (25 mg total) by mouth daily. 90 tablet 1  . ibuprofen (ADVIL,MOTRIN) 600 MG tablet Take 1 tablet (600 mg total) by mouth every 6 (six) hours as needed. 30 tablet 0  . metoprolol succinate (TOPROL-XL) 25 MG 24 hr tablet Take 1 tablet (25 mg total) by mouth daily. 90 tablet 1  . Multiple Vitamin (MULTIVITAMIN) tablet Take 1 tablet by mouth every evening.     . raloxifene (EVISTA) 60 MG tablet Take 60 mg by mouth daily.    . ranitidine (ZANTAC) 150 MG tablet Take 150 mg by mouth every evening.     No current facility-administered medications for this visit.     Allergies:  Allergies  Allergen Reactions  . No Known Allergies     Past Surgical History:  Procedure Laterality Date  . CARDIAC ELECTROPHYSIOLOGY STUDY AND ABLATION  2006   tachycardia  . CLOSED REDUCTION NASAL FRACTURE N/A 05/20/2016   Procedure: CLOSED REDUCTION NASAL FRACTURE AND REPAIR OF NOSE AND LIP LACERATION;  Surgeon: Loel Lofty Dillingham, DO;  Location: [facility];  Service: Plastics;  Laterality: N/A;  . COLON SURGERY    . COSMETIC SURGERY  05/2016  . [place]  . EXPLORATORY LAPAROTOMY  GYN fertility 1990's  . EYE SURGERY    . GANGLION CYST EXCISION Left 1997  .  HERNIA REPAIR  1988  . KNEE SURGERY Right 2012   lateral release  . LASIK    . left small fingernail removed  2006  . PARTIAL COLECTOMY  01/28/12  . PILONIDAL CYST EXCISION  1983  . RHINOPLASTY    . SHOULDER ARTHROSCOPY W/ ROTATOR CUFF REPAIR Right 2010 repair bicep    Social History   Social History  . Marital status: Married    Spouse name: N/A  . Number of children: N/A  . Years of education: N/A   Social History Main Topics  . Smoking status: Former Smoker    Packs/day: 0.50    Years: 32.00    Types: Cigarettes  . Smokeless tobacco: Never Used      Comment: hasn't smoked since 05/10/16  . Alcohol use 4.2 - 8.4 oz/week    7 - 14 Glasses of wine per week  . Drug use: No  . Sexual activity: Not Asked   Other Topics Concern  . None   Social History Narrative  . None    ROS See hpi  Objective: Vitals:   01/07/17 1058  BP: 132/78  Pulse: (!) 104  Resp: 16  Temp: 98.2 F (36.8 C)  TempSrc: Oral  SpO2: 95%  Weight: 201 lb 9.6 oz (91.4 kg)  Height: 5\' 6"  (1.676 m)    Physical Exam  Constitutional: She is oriented to person, place, and time. She appears well-developed and well-nourished.  HENT:  Head: Normocephalic and atraumatic.  Right Ear: External ear normal.  Left Ear: External ear normal.  Mouth/Throat: Oropharynx is clear and moist.  Eyes: Conjunctivae and EOM are normal.  Cardiovascular: Normal rate, regular rhythm and normal heart sounds.   No murmur heard. Pulmonary/Chest: Effort normal and breath sounds normal. No respiratory distress. She has no wheezes. She has no rales.  Musculoskeletal: Normal range of motion. She exhibits no edema.  Neurological: She is alert and oriented to person, place, and time.  Skin: Skin is warm. Capillary refill takes less than 2 seconds. No erythema.    Assessment and Plan Necie was seen today for medication refill and medication problem.  Diagnoses and all orders for this visit:  Essential hypertension- discussed bp today, refilled meds, check labs for renal function and screen for lipid -     Lipid panel -     Comprehensive metabolic panel -     Microalbumin, urine -     metoprolol succinate (TOPROL-XL) 25 MG 24 hr tablet; Take 1 tablet (25 mg total) by mouth daily. -     hydrochlorothiazide (HYDRODIURIL) 25 MG tablet; Take 1 tablet (25 mg total) by mouth daily.  Class 1 obesity due to excess calories without serious comorbidity with body mass index (BMI) of 31.0 to 31.9 in adult- discussed weight loss plan with patient today -     Lipid panel -     Comprehensive  metabolic panel -     Microalbumin, urine  Tachyarrhythmia- pulse elevated today -  Refilled metoprolol   RTC in 6 months  Amanda [name] A Amanda [name]

## 2017-01-08 ENCOUNTER — Ambulatory Visit: Payer: Self-pay

## 2017-01-08 LAB — COMPREHENSIVE METABOLIC PANEL
A/G RATIO: 1.4 (ref 1.2–2.2)
ALBUMIN: 4.2 g/dL (ref 3.5–5.5)
ALT: 63 IU/L — ABNORMAL HIGH (ref 0–32)
AST: 98 IU/L — ABNORMAL HIGH (ref 0–40)
Alkaline Phosphatase: 81 IU/L (ref 39–117)
BILIRUBIN TOTAL: 0.3 mg/dL (ref 0.0–1.2)
BUN / CREAT RATIO: 21 (ref 9–23)
BUN: 18 mg/dL (ref 6–24)
CALCIUM: 9.1 mg/dL (ref 8.7–10.2)
CHLORIDE: 95 mmol/L — AB (ref 96–106)
CO2: 31 mmol/L — ABNORMAL HIGH (ref 18–29)
Creatinine, Ser: 0.85 mg/dL (ref 0.57–1.00)
GFR, EST AFRICAN AMERICAN: 89 mL/min/{1.73_m2} (ref >59)
GFR, EST NON AFRICAN AMERICAN: 77 mL/min/{1.73_m2} (ref >59)
GLOBULIN, TOTAL: 3 g/dL (ref 1.5–4.5)
Glucose: 81 mg/dL (ref 65–99)
Potassium: 4.1 mmol/L (ref 3.5–5.2)
SODIUM: 140 mmol/L (ref 134–144)
Total Protein: 7.2 g/dL (ref 6.0–8.5)

## 2017-01-08 LAB — LIPID PANEL
CHOL/HDL RATIO: 2.1 ratio (ref 0.0–4.4)
Cholesterol, Total: 177 mg/dL (ref 100–199)
HDL: 83 mg/dL (ref >39)
LDL Calculated: 62 mg/dL (ref 0–99)
Triglycerides: 159 mg/dL — ABNORMAL HIGH (ref 0–149)
VLDL Cholesterol Cal: 32 mg/dL (ref 5–40)

## 2017-01-08 LAB — MICROALBUMIN, URINE: Microalbumin, Urine: 4.9 ug/mL

## 2017-01-27 DIAGNOSIS — H524 Presbyopia: Secondary | ICD-10-CM | POA: Diagnosis not present

## 2017-01-27 DIAGNOSIS — H5203 Hypermetropia, bilateral: Secondary | ICD-10-CM | POA: Diagnosis not present

## 2017-01-27 DIAGNOSIS — H52223 Regular astigmatism, bilateral: Secondary | ICD-10-CM | POA: Diagnosis not present

## 2017-01-27 MED FILL — HYDROCODON-APAP 5-325: 5-325 | 30 days supply | Qty: 60 | Fill #0

## 2017-01-27 MED FILL — RESTASIS 0.05% EYE EMULSION: 0.05 | 30 days supply | Qty: 60 | Fill #0

## 2017-02-09 MED FILL — RALOXIFENE HCL 60 MG TABLET: 60 | 30 days supply | Qty: 30 | Fill #0

## 2017-02-24 MED FILL — RESTASIS 0.05% EYE EMULSION: 0.05 | 30 days supply | Qty: 60 | Fill #1

## 2017-02-25 MED FILL — HYDROCODON-APAP 5-325: 5-325 | 30 days supply | Qty: 60 | Fill #0

## 2017-03-07 MED FILL — RALOXIFENE HCL 60 MG TABLET: 60 | 30 days supply | Qty: 30 | Fill #0

## 2017-03-09 DIAGNOSIS — G8929 Other chronic pain: Secondary | ICD-10-CM | POA: Diagnosis not present

## 2017-03-09 DIAGNOSIS — G894 Chronic pain syndrome: Secondary | ICD-10-CM | POA: Diagnosis not present

## 2017-03-09 DIAGNOSIS — M25561 Pain in right knee: Secondary | ICD-10-CM | POA: Diagnosis not present

## 2017-03-09 DIAGNOSIS — M1711 Unilateral primary osteoarthritis, right knee: Secondary | ICD-10-CM | POA: Diagnosis not present

## 2017-03-09 DIAGNOSIS — Z79891 Long term (current) use of opiate analgesic: Secondary | ICD-10-CM | POA: Diagnosis not present

## 2017-03-25 MED FILL — HYDROCODON-APAP 5-325: 5-325 | 30 days supply | Qty: 60 | Fill #0

## 2017-03-30 DIAGNOSIS — H04123 Dry eye syndrome of bilateral lacrimal glands: Secondary | ICD-10-CM | POA: Diagnosis not present

## 2017-03-30 DIAGNOSIS — H16223 Keratoconjunctivitis sicca, not specified as Sjogren's, bilateral: Secondary | ICD-10-CM | POA: Diagnosis not present

## 2017-04-04 MED FILL — METOPROLOL SUCC ER 25 MG TA: 25 | 90 days supply | Qty: 90 | Fill #1

## 2017-04-04 MED FILL — HYDROCHLOROTHIAZIDE 25 MG T: 25 | 90 days supply | Qty: 90 | Fill #1

## 2017-04-05 MED FILL — RALOXIFENE HCL 60 MG TABLET: 60 | 30 days supply | Qty: 30 | Fill #0

## 2017-04-22 MED FILL — HYDROCODON-APAP 5-325: 5-325 | 30 days supply | Qty: 60 | Fill #0

## 2017-05-03 ENCOUNTER — Encounter: Payer: Self-pay | Admitting: Family Medicine

## 2017-05-03 ENCOUNTER — Ambulatory Visit (INDEPENDENT_AMBULATORY_CARE_PROVIDER_SITE_OTHER): Payer: 59 | Admitting: Family Medicine

## 2017-05-03 VITALS — BP 115/75 | HR 80 | Temp 97.4°F | Resp 18 | Ht 66.1 in | Wt 196.6 lb

## 2017-05-03 DIAGNOSIS — F1020 Alcohol dependence, uncomplicated: Secondary | ICD-10-CM | POA: Diagnosis not present

## 2017-05-03 DIAGNOSIS — R7989 Other specified abnormal findings of blood chemistry: Secondary | ICD-10-CM

## 2017-05-03 DIAGNOSIS — Z79899 Other long term (current) drug therapy: Secondary | ICD-10-CM

## 2017-05-03 DIAGNOSIS — Z79891 Long term (current) use of opiate analgesic: Secondary | ICD-10-CM | POA: Diagnosis not present

## 2017-05-03 DIAGNOSIS — R945 Abnormal results of liver function studies: Secondary | ICD-10-CM

## 2017-05-03 MED ORDER — CITALOPRAM HYDROBROMIDE 10 MG PO TABS: 10.0000 mg | ORAL_TABLET | Freq: Every day | ORAL | 0 refills | Status: DC

## 2017-05-03 MED ORDER — CHLORDIAZEPOXIDE HCL 5 MG PO CAPS: 5.0000 mg | ORAL_CAPSULE | Freq: Three times a day (TID) | ORAL | 0 refills | Status: DC | PRN

## 2017-05-03 MED FILL — CITALOPRAM HBR 10 MG TABLET: 10 | 30 days supply | Qty: 30 | Fill #0

## 2017-05-03 NOTE — Patient Instructions (Addendum)
   IF you received an x-ray today, you will receive an invoice from [facility]. Please contact [facility] at [phone] with questions or concerns regarding your invoice.   IF you received labwork today, you will receive an invoice from [facility]. Please contact [facility] at [phone] with questions or concerns regarding your invoice.   Our billing staff will not be able to assist you with questions regarding bills from these companies.  You will be contacted with the lab results as soon as they are available. The fastest way to get your results is to activate your My Chart account. Instructions are located on the last page of this paperwork. If you have not heard from us regarding the results in 2 weeks, please contact this office.     Alcohol Use Disorder Alcohol use disorder is when your drinking disrupts your daily life. When you have this condition, you drink too much alcohol and you cannot control your drinking. Alcohol use disorder can cause serious problems with your physical health. It can affect your brain, heart, liver, pancreas, immune system, stomach, and intestines. Alcohol use disorder can increase your risk for certain cancers and cause problems with your mental health, such as depression, anxiety, psychosis, delirium, and dementia. People with this disorder risk hurting themselves and others. What are the causes? This condition is caused by drinking too much alcohol over time. It is not caused by drinking too much alcohol only one or two times. Some people with this condition drink alcohol to cope with or escape from negative life events. Others drink to relieve pain or symptoms of mental illness. What increases the risk? You are more likely to develop this condition if:  You have a family history of alcohol use disorder.  Your culture encourages drinking to the point of intoxication, or makes alcohol easy to get.  You had a mood or conduct disorder  in childhood.  You have been a victim of abuse.  You are an adolescent and: ? You have poor grades or difficulties in school. ? Your caregivers do not talk to you about saying no to alcohol, or supervise your activities. ? You are impulsive or you have trouble with self-control.  What are the signs or symptoms? Symptoms of this condition include:  Drinkingmore than you want to.  Drinking for longer than you want to.  Trying several times to drink less or to control your drinking.  Spending a lot of time getting alcohol, drinking, or recovering from drinking.  Craving alcohol.  Having problems at work, at school, or at home due to drinking.  Having problems in relationships due to drinking.  Drinking when it is dangerous to drink, such as before driving a car.  Continuing to drink even though you know you might have a physical or mental problem related to drinking.  Needing more and more alcohol to get the same effect you want from the alcohol (building up tolerance).  Having symptoms of withdrawal when you stop drinking. Symptoms of withdrawal include: ? Fatigue. ? Nightmares. ? Trouble sleeping. ? Depression. ? Anxiety. ? Fever. ? Seizures. ? Severe confusion. ? Feeling or seeing things that are not there (hallucinations). ? Tremors. ? Rapid heart rate. ? Rapid breathing. ? High blood pressure.  Drinking to avoid symptoms of withdrawal.  How is this diagnosed? This condition is diagnosed with an assessment. Your health care provider may start the assessment by asking three or four questions about your drinking. Your health care provider may   perform a physical exam or do lab tests to see if you have physical problems resulting from alcohol use. She or he may refer you to a mental health professional for evaluation. How is this treated? Some people with alcohol use disorder are able to reduce their alcohol use to low-risk levels. Others need to completely quit  drinking alcohol. When necessary, mental health professionals with specialized training in substance use treatment can help. Your health care provider can help you decide how severe your alcohol use disorder is and what type of treatment you need. The following forms of treatment are available:  Detoxification. Detoxification involves quitting drinking and using prescription medicines within the first week to help lessen withdrawal symptoms. This treatment is important for people who have had withdrawal symptoms before and for heavy drinkers who are likely to have withdrawal symptoms. Alcohol withdrawal can be dangerous, and in severe cases, it can cause death. Detoxification may be provided in a home, community, or primary care setting, or in a hospital or substance use treatment facility.  Counseling. This treatment is also called talk therapy. It is provided by substance use treatment counselors. A counselor can address the reasons you use alcohol and suggest ways to keep you from drinking again or to prevent problem drinking. The goals of talk therapy are to: ? Find healthy activities and ways for you to cope with stress. ? Identify and avoid the things that trigger your alcohol use. ? Help you learn how to handle cravings.  Medicines.Medicines can help treat alcohol use disorder by: ? Decreasing alcohol cravings. ? Decreasing the positive feeling you have when you drink alcohol. ? Causing an uncomfortable physical reaction when you drink alcohol (aversion therapy).  Support groups. Support groups are led by people who have quit drinking. They provide emotional support, advice, and guidance.  These forms of treatment are often combined. Some people with this condition benefit from a combination of treatments provided by specialized substance use treatment centers. Follow these instructions at home:  Take over-the-counter and prescription medicines only as told by your health care  provider.  Check with your health care provider before starting any new medicines.  Ask friends and family members not to offer you alcohol.  Avoid situations where alcohol is served, including gatherings where others are drinking alcohol.  Create a plan for what to do when you are tempted to use alcohol.  Find hobbies or activities that you enjoy that do not include alcohol.  Keep all follow-up visits as told by your health care provider. This is important. How is this prevented?  If you drink, limit alcohol intake to no more than 1 drink a day for nonpregnant women and 2 drinks a day for men. One drink equals 12 oz of beer, 5 oz of wine, or 1 oz of hard liquor.  If you have a mental health condition, get treatment and support.  Do not give alcohol to adolescents.  If you are an adolescent: ? Do not drink alcohol. ? Do not be afraid to say no if someone offers you alcohol. Speak up about why you do not want to drink. You can be a positive role model for your friends and set a good example for those around you by not drinking alcohol. ? If your friends drink, spend time with others who do not drink alcohol. Make new friends who do not use alcohol. ? Find healthy ways to manage stress and emotions, such as meditation or deep breathing, exercise, spending   time in nature, listening to music, or talking with a trusted friend or family member. Contact a health care provider if:  You are not able to take your medicines as told.  Your symptoms get worse.  You return to drinking alcohol (relapse) and your symptoms get worse. Get help right away if:  You have thoughts about hurting yourself or others. If you ever feel like you may hurt yourself or others, or have thoughts about taking your own life, get help right away. You can go to your nearest emergency department or call:  Your local emergency services (911 in the [place]  A suicide crisis helpline, such as the National Suicide  Prevention Lifeline at 1-800-273-8255. This is open 24 hours a day.  Summary  Alcohol use disorder is when your drinking disrupts your daily life. When you have this condition, you drink too much alcohol and you cannot control your drinking.  Treatment may include detoxification, counseling, medicine, and support groups.  Ask friends and family members not to offer you alcohol. Avoid situations where alcohol is served.  Get help right away if you have thoughts about hurting yourself or others. This information is not intended to replace advice given to you by your health care provider. Make sure you discuss any questions you have with your health care provider. Document Released: 10/07/2004 Document Revised: 05/27/2016 Document Reviewed: 05/27/2016 Elsevier Interactive Patient Education  2018 Elsevier Inc.  

## 2017-05-03 NOTE — Progress Notes (Signed)
Chief Complaint  Patient presents with  . Blood Work  . Depression    screening    HPI   Pt reports that her step daughter passed away in Dec 21, 2016 She states that it lead to increased alcohol use between her husband and herself.  Her husband was diagnosed with end stage liver disease.  She is his caregiver.  She reports that she decided to take better care of herself.  She reports that she is tired of feeling bad and "I just don't feel healthy".   In the past she took prozac. She reports when she took prozac before and had  She reports that her last drink was 22-Dec-2022 05/01/2017.  She reports that she was at home sick with tremors and vomiting She reports that she could not eat anything  She typically drank a pint of vodka a day   She reports that since she quit drinking she cannot sleep and has been taking Xanax almost daily.  Depression screen [facility] 2/9 05/03/2017 01/07/2017 10/12/2016 07/19/2016 05/14/2016  Decreased Interest 2 0 0 0 0  Down, Depressed, Hopeless 1 0 0 0 0  PHQ - 2 Score 3 0 0 0 0  Altered sleeping 3 - - - -  Tired, decreased energy 3 - - - -  Change in appetite 3 - - - -  Feeling bad or failure about yourself  2 - - - -  Trouble concentrating 1 - - - -  Moving slowly or fidgety/restless 1 - - - -  Suicidal thoughts 0 - - - -  PHQ-9 Score 16 - - - -  Difficult doing work/chores Somewhat difficult - - - -    Wt Readings from Last 3 Encounters:  05/03/17 196 lb 9.6 oz (89.2 kg)  01/07/17 201 lb 9.6 oz (91.4 kg)  10/12/16 198 lb 3.2 oz (89.9 kg)   Hypertension: Patient here for follow-up of elevated blood pressure. She is not exercising and is not adherent to low salt diet.  Blood pressure is not well monitored at home. Cardiac symptoms none. Patient denies chest pain, claudication and dyspnea.  Cardiovascular risk factors: hypertension, obesity (BMI >= 30 kg/m2) and sedentary lifestyle. Use of agents associated with hypertension: none. History of target organ damage:  none.  BP Readings from Last 3 Encounters:  05/03/17 115/75  01/07/17 132/78  10/12/16 138/88    Past Medical History:  Diagnosis Date  . Anxiety   . Arthritis   . Congenital patella maltracking right knee  . Depression   . Diverticulitis    perforated; required partial colectomy  . GERD (gastroesophageal reflux disease)    takes Zantac daily  . Perforated diverticulitis   . Peripheral edema    takes HCTZ daily as needed  . Pleural effusion, bilateral 11/28/11  . Tachyarrhythmia    s/p ablation-takes Metoprolol daily    Current Outpatient Prescriptions  Medication Sig Dispense Refill  . CycloSPORINE (RESTASIS OP) Apply to eye.    . hydrochlorothiazide (HYDRODIURIL) 25 MG tablet Take 1 tablet (25 mg total) by mouth daily. 90 tablet 1  . HYDROcodone-acetaminophen (NORCO/VICODIN) 5-325 MG tablet Take 1 tablet by mouth every 6 (six) hours as needed for moderate pain.    Marland Kitchen ibuprofen (ADVIL,MOTRIN) 600 MG tablet Take 1 tablet (600 mg total) by mouth every 6 (six) hours as needed. 30 tablet 0  . metoprolol succinate (TOPROL-XL) 25 MG 24 hr tablet Take 1 tablet (25 mg total) by mouth daily. 90 tablet 1  .  Multiple Vitamin (MULTIVITAMIN) tablet Take 1 tablet by mouth every evening.     . raloxifene (EVISTA) 60 MG tablet Take 60 mg by mouth daily.    . ranitidine (ZANTAC) 150 MG tablet Take 150 mg by mouth every evening.    Marland Kitchen acetaminophen (TYLENOL) 325 MG tablet Take 325-650 mg by mouth every 4 (four) hours as needed for moderate pain or headache.    . chlordiazePOXIDE (LIBRIUM) 5 MG capsule Take 1 capsule (5 mg total) by mouth 3 (three) times daily as needed for anxiety. 30 capsule 0  . citalopram (CELEXA) 10 MG tablet Take 1 tablet (10 mg total) by mouth daily. 30 tablet 0   No current facility-administered medications for this visit.     Allergies:  Allergies  Allergen Reactions  . No Known Allergies     Past Surgical History:  Procedure Laterality Date  . CARDIAC  ELECTROPHYSIOLOGY STUDY AND ABLATION  2006   tachycardia  . CLOSED REDUCTION NASAL FRACTURE N/A 05/20/2016   Procedure: CLOSED REDUCTION NASAL FRACTURE AND REPAIR OF NOSE AND LIP LACERATION;  Surgeon: Loel Lofty Dillingham, DO;  Location: [facility];  Service: Plastics;  Laterality: N/A;  . COLON SURGERY    . COSMETIC SURGERY  05/2016  . [place]  . EXPLORATORY LAPAROTOMY  GYN fertility 1990's  . EYE SURGERY    . GANGLION CYST EXCISION Left 1997  . HERNIA REPAIR  1988  . KNEE SURGERY Right 2012   lateral release  . LASIK    . left small fingernail removed  2006  . PARTIAL COLECTOMY  01/28/12  . PILONIDAL CYST EXCISION  1983  . RHINOPLASTY    . SHOULDER ARTHROSCOPY W/ ROTATOR CUFF REPAIR Right 2010 repair bicep    Social History   Social History  . Marital status: Married    Spouse name: N/A  . Number of children: N/A  . Years of education: N/A   Social History Main Topics  . Smoking status: Former Smoker    Packs/day: 0.50    Years: 32.00    Types: Cigarettes  . Smokeless tobacco: Never Used     Comment: hasn't smoked since 05/10/16  . Alcohol use 1.2 oz/week    1 Glasses of wine, 1 Cans of beer per week     Comment: 1 pint vodka a day  . Drug use: No  . Sexual activity: Not Asked   Other Topics Concern  . None   Social History Narrative  . None    ROS Review of Systems See HPI Constitution: No fevers or chills No malaise No diaphoresis Skin: No rash or itching Eyes: no blurry vision, no double vision GU: no dysuria or hematuria Neuro: no dizziness or headaches Psych: no hallucinations, no hyperactivity, no SI GI: she reports some nausea that has passed  Objective: Vitals:   05/03/17 1215  BP: 115/75  Pulse: 80  Resp: 18  Temp: (!) 97.4 F (36.3 C)  TempSrc: Oral  SpO2: 100%  Weight: 196 lb 9.6 oz (89.2 kg)  Height: 5' 6.1" (1.679 m)    Physical Exam  Constitutional: She is oriented to person, place, and time. She appears  well-developed and well-nourished.  HENT:  Head: Normocephalic and atraumatic.  Cardiovascular: Normal rate, regular rhythm and normal heart sounds.   Pulmonary/Chest: Effort normal and breath sounds normal. No respiratory distress. She has no wheezes.  Neurological: She is alert and oriented to person, place, and time.  Skin: Skin is warm. Capillary  refill takes less than 2 seconds.  no visible tremors   Assessment and Plan Amanda [name] was seen today for blood work and depression.  Diagnoses and all orders for this visit:  Chronic prescription opiate use- discussed that since she is taking norco consistently she should start a taper She will discuss this with her specialist  Chronic prescription benzodiazepine use- discussed that she should change the short acting xanax to Librium  She should take this to help with the alcohol withdrawal  Alcoholism ([place])- will check liver panel -     Hepatic Function Panel  Elevated LFTs- discussed that this should improve with alcohol cessation NASH is still a concern -     Hepatic Function Panel  Other orders -     chlordiazePOXIDE (LIBRIUM) 5 MG capsule; Take 1 capsule (5 mg total) by mouth 3 (three) times daily as needed for anxiety. -     citalopram (CELEXA) 10 MG tablet; Take 1 tablet (10 mg total) by mouth daily.  A total of  40 minutes were spent face-to-face with the patient during this encounter and over half of that time was spent on counseling and coordination of care.    [place]

## 2017-05-04 MED FILL — CHLORDIAZEPOXIDE 5 MG CAP: 5 | 10 days supply | Qty: 30 | Fill #0

## 2017-05-05 DIAGNOSIS — R7989 Other specified abnormal findings of blood chemistry: Secondary | ICD-10-CM | POA: Insufficient documentation

## 2017-05-05 DIAGNOSIS — R945 Abnormal results of liver function studies: Secondary | ICD-10-CM

## 2017-05-05 DIAGNOSIS — F1020 Alcohol dependence, uncomplicated: Secondary | ICD-10-CM | POA: Insufficient documentation

## 2017-05-05 DIAGNOSIS — Z79891 Long term (current) use of opiate analgesic: Secondary | ICD-10-CM | POA: Insufficient documentation

## 2017-05-10 MED FILL — RALOXIFENE HCL 60 MG TABLET: 60 | 30 days supply | Qty: 30 | Fill #0

## 2017-05-12 MED FILL — ALPRAZolam 1 MG TABS: 1 | 90 days supply | Qty: 180 | Fill #0

## 2017-05-20 MED FILL — HYDROCODON-APAP 5-325: 5-325 | 30 days supply | Qty: 60 | Fill #0

## 2017-05-23 ENCOUNTER — Ambulatory Visit (INDEPENDENT_AMBULATORY_CARE_PROVIDER_SITE_OTHER): Payer: 59 | Admitting: Family Medicine

## 2017-05-23 VITALS — BP 129/86 | HR 89 | Temp 97.8°F | Resp 16 | Ht 66.1 in | Wt 199.2 lb

## 2017-05-23 DIAGNOSIS — Z5181 Encounter for therapeutic drug level monitoring: Secondary | ICD-10-CM | POA: Diagnosis not present

## 2017-05-23 DIAGNOSIS — I10 Essential (primary) hypertension: Secondary | ICD-10-CM | POA: Diagnosis not present

## 2017-05-23 DIAGNOSIS — F1020 Alcohol dependence, uncomplicated: Secondary | ICD-10-CM | POA: Diagnosis not present

## 2017-05-23 MED ORDER — CITALOPRAM HYDROBROMIDE 20 MG PO TABS: 20.0000 mg | ORAL_TABLET | Freq: Every day | ORAL | 3 refills | Status: DC

## 2017-05-23 MED ORDER — CHLORDIAZEPOXIDE HCL 10 MG PO CAPS: 10.0000 mg | ORAL_CAPSULE | Freq: Three times a day (TID) | ORAL | 0 refills | Status: DC | PRN

## 2017-05-23 NOTE — Patient Instructions (Signed)
     IF you received an x-ray today, you will receive an invoice from [facility]. Please contact [facility] at [phone] with questions or concerns regarding your invoice.   IF you received labwork today, you will receive an invoice from [facility]. Please contact [facility] at [phone] with questions or concerns regarding your invoice.   Our billing staff will not be able to assist you with questions regarding bills from these companies.  You will be contacted with the lab results as soon as they are available. The fastest way to get your results is to activate your My Chart account. Instructions are located on the last page of this paperwork. If you have not heard from us regarding the results in 2 weeks, please contact this office.     

## 2017-05-23 NOTE — Progress Notes (Signed)
Chief Complaint  Patient presents with  . Medication Problem    from last visit on 05/03/17, still has a little depression but not as bad as it was  . Follow-up    HPI She is taking citalopram 10mg  every morning She reports that her mood is better She reports that she gets diarrhea and she usually takes an imodium The diarrhea started after starting citalopram She is takes the librium twice a day  She reports that she has cut down on her alcohol but admits that she had a set back over labor day weekend She reports that she did a lot online shopping over the weekend She states that this weekend was her husband daughter's birthday weekend and they were just mourning.  Depression screen [facility] 2/9 05/23/2017 05/03/2017 01/07/2017 10/12/2016 07/19/2016  Decreased Interest 0 2 0 0 0  Down, Depressed, Hopeless 0 1 0 0 0  PHQ - 2 Score 0 3 0 0 0  Altered sleeping - 3 - - -  Tired, decreased energy - 3 - - -  Change in appetite - 3 - - -  Feeling bad or failure about yourself  - 2 - - -  Trouble concentrating - 1 - - -  Moving slowly or fidgety/restless - 1 - - -  Suicidal thoughts - 0 - - -  PHQ-9 Score - 16 - - -  Difficult doing work/chores - Somewhat difficult - - -    Past Medical History:  Diagnosis Date  . Anxiety   . Arthritis   . Congenital patella maltracking right knee  . Depression   . Diverticulitis    perforated; required partial colectomy  . GERD (gastroesophageal reflux disease)    takes Zantac daily  . Perforated diverticulitis   . Peripheral edema    takes HCTZ daily as needed  . Pleural effusion, bilateral 11/28/11  . Tachyarrhythmia    s/p ablation-takes Metoprolol daily    Current Outpatient Prescriptions  Medication Sig Dispense Refill  . acetaminophen (TYLENOL) 325 MG tablet Take 325-650 mg by mouth every 4 (four) hours as needed for moderate pain or headache.    . chlordiazePOXIDE (LIBRIUM) 10 MG capsule Take 1 capsule (10 mg total) by mouth 3 (three)  times daily as needed for anxiety. 90 capsule 0  . citalopram (CELEXA) 20 MG tablet Take 1 tablet (20 mg total) by mouth daily. 30 tablet 3  . CycloSPORINE (RESTASIS OP) Apply to eye.    . hydrochlorothiazide (HYDRODIURIL) 25 MG tablet Take 1 tablet (25 mg total) by mouth daily. 90 tablet 1  . HYDROcodone-acetaminophen (NORCO/VICODIN) 5-325 MG tablet Take 1 tablet by mouth every 6 (six) hours as needed for moderate pain.    Marland Kitchen ibuprofen (ADVIL,MOTRIN) 600 MG tablet Take 1 tablet (600 mg total) by mouth every 6 (six) hours as needed. 30 tablet 0  . metoprolol succinate (TOPROL-XL) 25 MG 24 hr tablet Take 1 tablet (25 mg total) by mouth daily. 90 tablet 1  . Multiple Vitamin (MULTIVITAMIN) tablet Take 1 tablet by mouth every evening.     . raloxifene (EVISTA) 60 MG tablet Take 60 mg by mouth daily.    . ranitidine (ZANTAC) 150 MG tablet Take 150 mg by mouth every evening.     No current facility-administered medications for this visit.     Allergies:  Allergies  Allergen Reactions  . No Known Allergies     Past Surgical History:  Procedure Laterality Date  . CARDIAC ELECTROPHYSIOLOGY STUDY AND ABLATION  2006   tachycardia  . CLOSED REDUCTION NASAL FRACTURE N/A 05/20/2016   Procedure: CLOSED REDUCTION NASAL FRACTURE AND REPAIR OF NOSE AND LIP LACERATION;  Surgeon: Loel Lofty Dillingham, DO;  Location: [facility];  Service: Plastics;  Laterality: N/A;  . COLON SURGERY    . COSMETIC SURGERY  05/2016  . [place]  . EXPLORATORY LAPAROTOMY  GYN fertility 1990's  . EYE SURGERY    . GANGLION CYST EXCISION Left 1997  . HERNIA REPAIR  1988  . KNEE SURGERY Right 2012   lateral release  . LASIK    . left small fingernail removed  2006  . PARTIAL COLECTOMY  01/28/12  . PILONIDAL CYST EXCISION  1983  . RHINOPLASTY    . SHOULDER ARTHROSCOPY W/ ROTATOR CUFF REPAIR Right 2010 repair bicep    Social History   Social History  . Marital status: Married    Spouse name: N/A  .  Number of children: N/A  . Years of education: N/A   Social History Main Topics  . Smoking status: Former Smoker    Packs/day: 0.50    Years: 32.00    Types: Cigarettes  . Smokeless tobacco: Never Used     Comment: hasn't smoked since 05/10/16  . Alcohol use 1.2 oz/week    1 Glasses of wine, 1 Cans of beer per week     Comment: 1 pint vodka a day  . Drug use: No  . Sexual activity: Not on file   Other Topics Concern  . Not on file   Social History Narrative  . No narrative on file    ROS Review of Systems See HPI Constitution: No fevers or chills No malaise No diaphoresis Skin: No rash or itching Eyes: no blurry vision, no double vision GU: no dysuria or hematuria Neuro: no dizziness or headaches  Objective: Vitals:   05/23/17 1213  BP: 129/86  Pulse: 89  Resp: 16  Temp: 97.8 F (36.6 C)  TempSrc: Oral  SpO2: 93%  Weight: 199 lb 3.2 oz (90.4 kg)  Height: 5' 6.1" (1.679 m)    Physical Exam Physical Exam  Constitutional: She is oriented to person, place, and time. She appears well-developed and well-nourished.  HENT:  Head: Normocephalic and atraumatic.  Eyes: Conjunctivae and EOM are normal.  Cardiovascular: Normal rate, regular rhythm and normal heart sounds.   Pulmonary/Chest: Effort normal and breath sounds normal. No respiratory distress. She has no wheezes.  Abdominal: Normal appearance and bowel sounds are normal. There is no tenderness. There is no CVA tenderness.  Neurological: She is alert and oriented to person, place, and time.   Assessment and Plan Beckie was seen today for medication problem and follow-up.  Diagnoses and all orders for this visit:  Alcoholism ([place]) -     Hepatic Function Panel  Encounter for medication monitoring  Essential hypertension  Other orders -     Cancel: Flu Vaccine QUAD 36+ mos IM -     citalopram (CELEXA) 20 MG tablet; Take 1 tablet (20 mg total) by mouth daily. -     chlordiazePOXIDE (LIBRIUM) 10 MG  capsule; Take 1 capsule (10 mg total) by mouth 3 (three) times daily as needed for anxiety.   Problem List Items Addressed This Visit      Cardiovascular and Mediastinum   Essential hypertension    BP in good range. CPM        Other   Encounter for medication monitoring    Previously  noted to have elevated lfts. Will assess today.  Also increased dose of celexa      Alcoholism ([place]) - Primary    Patient is cutting down substantially on her alcohol intake.  She will start counseling through employee health      Relevant Orders   Hepatic Function Panel (Completed)       A total of 30 minutes were spent face-to-face with the patient during this encounter and over half of that time was spent on counseling and coordination of care.   [place]

## 2017-05-24 LAB — HEPATIC FUNCTION PANEL
ALT: 31 IU/L (ref 0–32)
AST: 53 IU/L — AB (ref 0–40)
Albumin: 4.3 g/dL (ref 3.5–5.5)
Alkaline Phosphatase: 89 IU/L (ref 39–117)
BILIRUBIN TOTAL: 1 mg/dL (ref 0.0–1.2)
Bilirubin, Direct: 0.37 mg/dL (ref 0.00–0.40)
Total Protein: 7.3 g/dL (ref 6.0–8.5)

## 2017-05-25 ENCOUNTER — Encounter: Payer: Self-pay | Admitting: Family Medicine

## 2017-05-25 MED FILL — CHLORDIAZEPOXIDE 10 MG CAP: 10 | 30 days supply | Qty: 90 | Fill #0

## 2017-05-25 NOTE — Assessment & Plan Note (Signed)
Patient is cutting down substantially on her alcohol intake.  She will start counseling through employee health

## 2017-05-25 NOTE — Assessment & Plan Note (Signed)
BP in good range. CPM

## 2017-05-25 NOTE — Assessment & Plan Note (Signed)
Previously noted to have elevated lfts. Will assess today.  Also increased dose of celexa

## 2017-06-02 MED FILL — CITALOPRAM HBR 20 MG TABLET: 20 | 30 days supply | Qty: 30 | Fill #0

## 2017-06-13 DIAGNOSIS — M25561 Pain in right knee: Secondary | ICD-10-CM | POA: Diagnosis not present

## 2017-06-13 DIAGNOSIS — M1711 Unilateral primary osteoarthritis, right knee: Secondary | ICD-10-CM | POA: Diagnosis not present

## 2017-06-13 DIAGNOSIS — G8929 Other chronic pain: Secondary | ICD-10-CM | POA: Diagnosis not present

## 2017-06-13 MED FILL — RALOXIFENE HCL 60 MG TABLET: 60 | 30 days supply | Qty: 30 | Fill #1

## 2017-06-17 MED FILL — HYDROCODON-APAP 5-325: 5-325 | 30 days supply | Qty: 60 | Fill #0

## 2017-06-27 ENCOUNTER — Ambulatory Visit: Payer: 59 | Admitting: Family Medicine

## 2017-06-27 NOTE — Progress Notes (Deleted)
No chief complaint on file.   HPI  Past Medical History:  Diagnosis Date  . Anxiety   . Arthritis   . Congenital patella maltracking right knee  . Depression   . Diverticulitis    perforated; required partial colectomy  . GERD (gastroesophageal reflux disease)    takes Zantac daily  . Perforated diverticulitis   . Peripheral edema    takes HCTZ daily as needed  . Pleural effusion, bilateral 11/28/11  . Tachyarrhythmia    s/p ablation-takes Metoprolol daily    Current Outpatient Prescriptions  Medication Sig Dispense Refill  . acetaminophen (TYLENOL) 325 MG tablet Take 325-650 mg by mouth every 4 (four) hours as needed for moderate pain or headache.    . chlordiazePOXIDE (LIBRIUM) 10 MG capsule Take 1 capsule (10 mg total) by mouth 3 (three) times daily as needed for anxiety. 90 capsule 0  . citalopram (CELEXA) 20 MG tablet Take 1 tablet (20 mg total) by mouth daily. 30 tablet 3  . CycloSPORINE (RESTASIS OP) Apply to eye.    . hydrochlorothiazide (HYDRODIURIL) 25 MG tablet Take 1 tablet (25 mg total) by mouth daily. 90 tablet 1  . HYDROcodone-acetaminophen (NORCO/VICODIN) 5-325 MG tablet Take 1 tablet by mouth every 6 (six) hours as needed for moderate pain.    Marland Kitchen ibuprofen (ADVIL,MOTRIN) 600 MG tablet Take 1 tablet (600 mg total) by mouth every 6 (six) hours as needed. 30 tablet 0  . metoprolol succinate (TOPROL-XL) 25 MG 24 hr tablet Take 1 tablet (25 mg total) by mouth daily. 90 tablet 1  . Multiple Vitamin (MULTIVITAMIN) tablet Take 1 tablet by mouth every evening.     . raloxifene (EVISTA) 60 MG tablet Take 60 mg by mouth daily.    . ranitidine (ZANTAC) 150 MG tablet Take 150 mg by mouth every evening.     No current facility-administered medications for this visit.     Allergies:  Allergies  Allergen Reactions  . No Known Allergies     Past Surgical History:  Procedure Laterality Date  . CARDIAC ELECTROPHYSIOLOGY STUDY AND ABLATION  2006   tachycardia  . CLOSED  REDUCTION NASAL FRACTURE N/A 05/20/2016   Procedure: CLOSED REDUCTION NASAL FRACTURE AND REPAIR OF NOSE AND LIP LACERATION;  Surgeon: Loel Lofty Dillingham, DO;  Location: [place];  Service: Plastics;  Laterality: N/A;  . COLON SURGERY    . COSMETIC SURGERY  05/2016  . [place]  . EXPLORATORY LAPAROTOMY  GYN fertility 1990's  . EYE SURGERY    . GANGLION CYST EXCISION Left 1997  . HERNIA REPAIR  1988  . KNEE SURGERY Right 2012   lateral release  . LASIK    . left small fingernail removed  2006  . PARTIAL COLECTOMY  01/28/12  . PILONIDAL CYST EXCISION  1983  . RHINOPLASTY    . SHOULDER ARTHROSCOPY W/ ROTATOR CUFF REPAIR Right 2010 repair bicep    Social History   Social History  . Marital status: Married    Spouse name: N/A  . Number of children: N/A  . Years of education: N/A   Social History Main Topics  . Smoking status: Former Smoker    Packs/day: 0.50    Years: 32.00    Types: Cigarettes  . Smokeless tobacco: Never Used     Comment: hasn't smoked since 05/10/16  . Alcohol use 1.2 oz/week    1 Glasses of wine, 1 Cans of beer per week     Comment: 1 pint  vodka a day  . Drug use: No  . Sexual activity: Not on file   Other Topics Concern  . Not on file   Social History Narrative  . No narrative on file    ROS  Objective: There were no vitals filed for this visit.  Physical Exam  Assessment and Plan There are no diagnoses linked to this encounter.   Ardie Dragoo P [employer]

## 2017-07-04 MED FILL — CITALOPRAM HBR 20 MG TABLET: 20 | 30 days supply | Qty: 30 | Fill #1

## 2017-07-10 MED FILL — RALOXIFENE HCL 60 MG TABLET: 60 | 30 days supply | Qty: 30 | Fill #2

## 2017-07-12 NOTE — Progress Notes (Signed)
Chief Complaint  Patient presents with  . Follow-up    blood pressure medications and needs refills on hydrochlorothiazide and metoprolol    HPI   Hypertension: Patient here for follow-up of elevated blood pressure. She is exercising and is adherent to low salt diet.  Blood pressure is well controlled at home. Cardiac symptoms none. Patient denies chest pain, dyspnea, exertional chest pressure/discomfort, fatigue, irregular heart beat, lower extremity edema, near-syncope, orthopnea, palpitations and syncope.  Cardiovascular risk factors: hypertension. Use of agents associated with hypertension: none. History of target organ damage: none. BP Readings from Last 3 Encounters:  07/13/17 122/84  05/23/17 129/86  05/03/17 115/75   Alcohol abuse She reports that a few weeks ago she went to [place] and she reports that she had some alcohol  She reports that she was still on her librium and celexa She is going through alcohol withdraw She denies hallucinations, bizarre dreams or suicidal thoughts   4 review of systems  Past Medical History:  Diagnosis Date  . Anxiety   . Arthritis   . Congenital patella maltracking right knee  . Depression   . Diverticulitis    perforated; required partial colectomy  . GERD (gastroesophageal reflux disease)    takes Zantac daily  . Perforated diverticulitis   . Peripheral edema    takes HCTZ daily as needed  . Pleural effusion, bilateral 11/28/11  . Tachyarrhythmia    s/p ablation-takes Metoprolol daily    Current Outpatient Medications  Medication Sig Dispense Refill  . chlordiazePOXIDE (LIBRIUM) 10 MG capsule Take 1 capsule (10 mg total) by mouth 3 (three) times daily as needed for anxiety. 90 capsule 0  . citalopram (CELEXA) 20 MG tablet Take 1 tablet (20 mg total) by mouth daily. 30 tablet 3  . CycloSPORINE (RESTASIS OP) Apply to eye.    . hydrochlorothiazide (HYDRODIURIL) 25 MG tablet Take 1 tablet (25 mg total) by mouth daily. 90 tablet 1  .  HYDROcodone-acetaminophen (NORCO/VICODIN) 5-325 MG tablet Take 1 tablet by mouth every 6 (six) hours as needed for moderate pain.    Marland Kitchen ibuprofen (ADVIL,MOTRIN) 600 MG tablet Take 1 tablet (600 mg total) by mouth every 6 (six) hours as needed. 30 tablet 0  . metoprolol succinate (TOPROL-XL) 25 MG 24 hr tablet Take 1 tablet (25 mg total) by mouth daily. 90 tablet 1  . Multiple Vitamin (MULTIVITAMIN) tablet Take 1 tablet by mouth every evening.     . raloxifene (EVISTA) 60 MG tablet Take 60 mg by mouth daily.    . ranitidine (ZANTAC) 150 MG tablet Take 150 mg by mouth every evening.    Marland Kitchen acetaminophen (TYLENOL) 325 MG tablet Take 325-650 mg by mouth every 4 (four) hours as needed for moderate pain or headache.     No current facility-administered medications for this visit.     Allergies:  Allergies  Allergen Reactions  . No Known Allergies     Past Surgical History:  Procedure Laterality Date  . CARDIAC ELECTROPHYSIOLOGY STUDY AND ABLATION  2006   tachycardia  . COLON SURGERY    . COSMETIC SURGERY  05/2016  . [place]  . EXPLORATORY LAPAROTOMY  GYN fertility 1990's  . EYE SURGERY    . GANGLION CYST EXCISION Left 1997  . HERNIA REPAIR  1988  . KNEE SURGERY Right 2012   lateral release  . LASIK    . left small fingernail removed  2006  . PARTIAL COLECTOMY  01/28/12  . PILONIDAL CYST  EXCISION  1983  . RHINOPLASTY    . SHOULDER ARTHROSCOPY W/ ROTATOR CUFF REPAIR Right 2010 repair bicep    Social History   Socioeconomic History  . Marital status: Married    Spouse name: None  . Number of children: None  . Years of education: None  . Highest education level: None  Social Needs  . Financial resource strain: None  . Food insecurity - worry: None  . Food insecurity - inability: None  . Transportation needs - medical: None  . Transportation needs - non-medical: None  Occupational History  . None  Tobacco Use  . Smoking status: Former Smoker     Packs/day: 0.50    Years: 32.00    Pack years: 16.00    Types: Cigarettes  . Smokeless tobacco: Never Used  . Tobacco comment: hasn't smoked since 05/10/16  Substance and Sexual Activity  . Alcohol use: Yes    Alcohol/week: 1.2 oz    Types: 1 Glasses of wine, 1 Cans of beer per week    Comment: 1 pint vodka a day  . Drug use: No  . Sexual activity: None  Other Topics Concern  . None  Social History Narrative  . None    Family History  Problem Relation Age of Onset  . Heart failure Mother   . Stroke Mother   . Cancer Father 22       brain tumor  . Stomach cancer Paternal Uncle   . Colon cancer Paternal Uncle   . Stomach cancer Cousin   . Esophageal cancer Neg Hx   . Rectal cancer Neg Hx      ROS Review of Systems See HPI Constitution: No fevers or chills No malaise No diaphoresis Skin: No rash or itching Eyes: no blurry vision, no double vision GU: no dysuria or hematuria Neuro: no dizziness or headaches    Objective: Vitals:   07/13/17 1213  BP: 122/84  Pulse: 92  Resp: 17  Temp: 97.7 F (36.5 C)  TempSrc: Oral  SpO2: 95%  Weight: 195 lb 6.4 oz (88.6 kg)  Height: 5' 6.1" (1.679 m)    Physical Exam  Constitutional: She is oriented to person, place, and time. She appears well-developed and well-nourished.  HENT:  Head: Normocephalic and atraumatic.  Cardiovascular: Normal rate, regular rhythm and normal heart sounds.  Pulmonary/Chest: Effort normal and breath sounds normal. No stridor. No respiratory distress.  Musculoskeletal: Normal range of motion. She exhibits no edema.  Neurological: She is alert and oriented to person, place, and time.    Assessment and Plan Tamanika was seen today for follow-up.  Diagnoses and all orders for this visit:  Alcoholism (Jeff)- pt to continue with counseling Discussed strategies to get support over the holidays Gave hotline numbers  Essential hypertension- bp in range Continue current meds Last renal fx and  electrolytes in range -     hydrochlorothiazide (HYDRODIURIL) 25 MG tablet; Take 1 tablet (25 mg total) by mouth daily. -     metoprolol succinate (TOPROL-XL) 25 MG 24 hr tablet; Take 1 tablet (25 mg total) by mouth daily.  Encounter for medication monitoring- refills today, no side effects  Other orders -     Cancel: Flu Vaccine QUAD 36+ mos IM     Eddie Koc A Kenae Lindquist

## 2017-07-13 ENCOUNTER — Encounter: Payer: Self-pay | Admitting: Family Medicine

## 2017-07-13 ENCOUNTER — Ambulatory Visit (INDEPENDENT_AMBULATORY_CARE_PROVIDER_SITE_OTHER): Payer: 59 | Admitting: Family Medicine

## 2017-07-13 VITALS — BP 122/84 | HR 92 | Temp 97.7°F | Resp 17 | Ht 66.1 in | Wt 195.4 lb

## 2017-07-13 DIAGNOSIS — I10 Essential (primary) hypertension: Secondary | ICD-10-CM

## 2017-07-13 DIAGNOSIS — Z5181 Encounter for therapeutic drug level monitoring: Secondary | ICD-10-CM

## 2017-07-13 DIAGNOSIS — F1020 Alcohol dependence, uncomplicated: Secondary | ICD-10-CM | POA: Diagnosis not present

## 2017-07-13 MED ORDER — HYDROCHLOROTHIAZIDE 25 MG PO TABS: 25.0000 mg | ORAL_TABLET | Freq: Every day | ORAL | 1 refills | Status: DC

## 2017-07-13 MED ORDER — METOPROLOL SUCCINATE ER 25 MG PO TB24: 25.0000 mg | ORAL_TABLET | Freq: Every day | ORAL | 1 refills | Status: DC

## 2017-07-13 MED FILL — HYDROCHLOROTHIAZIDE 25 MG T: 25 | 90 days supply | Qty: 90 | Fill #0

## 2017-07-13 MED FILL — METOPROLOL SUCC ER 25 MG TA: 25 | 90 days supply | Qty: 90 | Fill #0

## 2017-07-13 NOTE — Patient Instructions (Addendum)
IF you received an x-ray today, you will receive an invoice from [facility]. Please contact [facility] at [phone] with questions or concerns regarding your invoice.   IF you received labwork today, you will receive an invoice from [facility]. Please contact [facility] at [phone] with questions or concerns regarding your invoice.   Our billing staff will not be able to assist you with questions regarding bills from these companies.  You will be contacted with the lab results as soon as they are available. The fastest way to get your results is to activate your My Chart account. Instructions are located on the last page of this paperwork. If you have not heard from [place] regarding the results in 2 weeks, please contact this office.    We recommend that you schedule a mammogram for breast cancer screening. Typically, you do not need a referral to do this. Please contact a local imaging center to schedule your mammogram.  [facility] - [phone]  *ask for the Radiology Department [facility] - [phone] or [phone]  [facility] - [phone] [place] [phone] [facility] - [phone]  *ask for the [facility] - [phone]  *ask for the Radiology Department [facility] - [phone]  *ask for the Mammography Department [facility] - [phone] Finding Treatment for Addiction What is addiction? Addiction is a complex disease of the brain. It causes an uncontrollable (compulsive) need for a substance. You can be addicted to alcohol, illegal drugs, or prescription medicines such as painkillers. Addiction can also be a behavior, like gambling or shopping. The need for the drug or activity can become so strong that you think about it all the time. You can also become physically dependent on a substance. Addiction  can change the way your brain works. Because of these changes, getting more of whatever you are addicted to becomes the most important thing to you and feels better than other activities or relationships. Addiction can lead to changes in health, behavior, emotions, relationships, and choices that affect you and everyone around you. How do I know if I need treatment for addiction? Addiction is a progressive disease. Without treatment, addiction can get worse. Living with addiction puts you at higher risk for injury, poor health, lost employment, loss of money, and even death. You might need treatment for addiction if:  You have tried to stop or cut down, but you cannot.  Your addiction is causing physical health problems.  You find it annoying that your friends and family are concerned about your alcohol or substance use.  You feel guilty about substance abuse or a compulsive behavior.  You have lied or tried to hide your addiction.  You need a particular substance or activity to start your day or to calm down.  You are getting in trouble at school, work, home, or with the police.  You have done something illegal to support your addiction.  You are running out of money because of your addiction.  You have no time for anything other than your addiction.  What types of treatment are available? The treatment program that is right for you will depend on many factors, including the type of addiction you have. Treatment programs can be outpatient or inpatient. In an outpatient program, you live at home and go to work or school, but you also go to a clinic for treatment. With  an inpatient program, you live and sleep at the program facility during treatment. After treatment, you might need a plan for support during recovery. Other treatment options include:  Medicine. ? Some addictions may be treated with prescription medicines. ? You might also need medicine to treat anxiety or  depression.  Counseling and behavior therapy. Therapy can help individuals and families behave in healthier ways and relate more effectively.  Support groups. Confidential group therapy, such as a 12-step program, can help individuals and families during treatment and recovery.  No single type of program is right for everyone. Many treatment programs involve a combination of education, counseling, and a 12-step, spiritually-based approach. Some treatment programs are government sponsored. They are geared for patients who do not have private insurance. Treatment programs can vary in many respects, such as:  Cost and types of insurance that are accepted.  Types of on-site medical services that are offered.  Length of stay, setting, and size.  Overall philosophy of treatment.  What should I consider when selecting a treatment program? It is important to think about your individual requirements when selecting a treatment program. There are a number of things to consider, such as:  If the program is certified by the appropriate government agency. Even private programs must be certified and employ certified professionals.  If the program is covered by your insurance. If finances are a concern, the first call you should make is to your insurance company, if you have health insurance. Ask for a list of treatment programs that are in your network, and confirm any copayments and deductibles that you may have to pay. ? If you do not have insurance, or if you choose to attend a program that does not accept your insurance, discuss whether a payment plan can be set up.  If treatment is available in languages other than English, if needed.  If the program offers detoxification treatment, if needed.  If 12-step meetings are held at the center or if transport is available for patients to attend meetings at other locations.  If the program is professional, organized, and clean.  If the program meets  all of your needs, including physical and cultural needs.  If the facility offers specific treatment for your particular addiction.  If support continues to be offered after you have left the program.  If your treatment plan is continually looked at to make sure you are receiving the right treatment at the right time.  If mental health counseling is part of your treatment.  If medicine is included in treatment, if needed.  If your family is included in your treatment plan and if support is offered to them throughout the treatment process.  How the treatment works to prevent relapse.  Where else can I get help?  Your health care provider. Ask him or her to help you find addiction treatment. These discussions are confidential.  The [employer] on Alcoholism and Drug Dependence (NCADD). This group has information about treatment centers and programs for people who have an addiction and for family members. ? The telephone number is 1-800-NCA-CALL (7083751877). ? The website is https://ncadd.org/about-ncadd/our-affiliates  The [facility]). This group will help you find publicly funded treatment centers, help hotlines, and counseling services near you. ? The telephone number is 1-800-662-HELP 646-117-7105). ? The website is www.findtreatment.[employer].com.cy In countries outside of the [place] and [place], look in [employer] for contact information for services in your area. This information is not  intended to replace advice given to you by your health care provider. Make sure you discuss any questions you have with your health care provider. Document Released: 07/29/2005 Document Revised: 07/26/2016 Document Reviewed: 06/18/2014 Elsevier Interactive Patient Education  2017 [employer].

## 2017-07-15 MED FILL — HYDROCODON-APAP 5-325: 5-325 | 30 days supply | Qty: 60 | Fill #0

## 2017-08-10 MED FILL — RALOXIFENE HCL 60 MG TABLET: 60 | 30 days supply | Qty: 30 | Fill #3

## 2017-08-10 MED FILL — CITALOPRAM HBR 20 MG TABLET: 20 | 30 days supply | Qty: 30 | Fill #2

## 2017-08-10 MED FILL — ALPRAZolam 1 MG TABS: 1 | 90 days supply | Qty: 180 | Fill #1

## 2017-08-11 ENCOUNTER — Other Ambulatory Visit (HOSPITAL_BASED_OUTPATIENT_CLINIC_OR_DEPARTMENT_OTHER): Payer: Self-pay | Admitting: Obstetrics & Gynecology

## 2017-08-11 DIAGNOSIS — Z1231 Encounter for screening mammogram for malignant neoplasm of breast: Secondary | ICD-10-CM

## 2017-08-11 MED FILL — HYDROCODON-APAP 5-325: 5-325 | 30 days supply | Qty: 60 | Fill #0

## 2017-08-18 ENCOUNTER — Ambulatory Visit (HOSPITAL_BASED_OUTPATIENT_CLINIC_OR_DEPARTMENT_OTHER)
Admission: RE | Admit: 2017-08-18 | Discharge: 2017-08-18 | Disposition: A | Discharge status: Home / Self Care | Payer: 59 | Source: Ambulatory Visit | Attending: Obstetrics & Gynecology | Admitting: Obstetrics & Gynecology

## 2017-08-18 DIAGNOSIS — Z1231 Encounter for screening mammogram for malignant neoplasm of breast: Secondary | ICD-10-CM | POA: Diagnosis not present

## 2017-09-05 MED FILL — CITALOPRAM HBR 20 MG TABLET: 20 | 30 days supply | Qty: 30 | Fill #3

## 2017-09-05 MED FILL — RALOXIFENE HCL 60 MG TABLET: 60 | 30 days supply | Qty: 30 | Fill #4

## 2017-09-08 MED FILL — HYDROCODON-APAP 5-325: 5-325 | 30 days supply | Qty: 60 | Fill #0

## 2017-09-12 DIAGNOSIS — M1711 Unilateral primary osteoarthritis, right knee: Secondary | ICD-10-CM | POA: Diagnosis not present

## 2017-09-12 DIAGNOSIS — M25561 Pain in right knee: Secondary | ICD-10-CM | POA: Diagnosis not present

## 2017-09-12 DIAGNOSIS — G8929 Other chronic pain: Secondary | ICD-10-CM | POA: Diagnosis not present

## 2017-10-03 MED FILL — RALOXIFENE HCL 60 MG TABLET: 60 | 30 days supply | Qty: 30 | Fill #5

## 2017-10-04 MED FILL — METOPROLOL SUCC ER 25 MG TA: 25 | 90 days supply | Qty: 90 | Fill #1

## 2017-10-04 MED FILL — HYDROCHLOROTHIAZIDE 25 MG T: 25 | 90 days supply | Qty: 90 | Fill #1

## 2017-10-07 MED FILL — HYDROCODON-APAP 5-325: 5-325 | 30 days supply | Qty: 60 | Fill #0

## 2017-10-19 ENCOUNTER — Encounter: Payer: Self-pay | Admitting: Family Medicine

## 2017-10-19 ENCOUNTER — Ambulatory Visit (INDEPENDENT_AMBULATORY_CARE_PROVIDER_SITE_OTHER): Payer: 59 | Admitting: Family Medicine

## 2017-10-19 ENCOUNTER — Other Ambulatory Visit: Payer: Self-pay

## 2017-10-19 VITALS — BP 113/80 | HR 85 | Temp 98.0°F | Resp 16 | Ht 66.5 in | Wt 199.4 lb

## 2017-10-19 DIAGNOSIS — F1020 Alcohol dependence, uncomplicated: Secondary | ICD-10-CM | POA: Diagnosis not present

## 2017-10-19 DIAGNOSIS — F321 Major depressive disorder, single episode, moderate: Secondary | ICD-10-CM

## 2017-10-19 DIAGNOSIS — I10 Essential (primary) hypertension: Secondary | ICD-10-CM | POA: Diagnosis not present

## 2017-10-19 MED ORDER — CITALOPRAM HYDROBROMIDE 20 MG PO TABS: 20.0000 mg | ORAL_TABLET | Freq: Every day | ORAL | 3 refills | Status: DC

## 2017-10-19 NOTE — Patient Instructions (Addendum)
IF you received an x-ray today, you will receive an invoice from [facility]. Please contact [facility] at [phone] with questions or concerns regarding your invoice.   IF you received labwork today, you will receive an invoice from [facility]. Please contact [facility] at [phone] with questions or concerns regarding your invoice.   Our billing staff will not be able to assist you with questions regarding bills from these companies.  You will be contacted with the lab results as soon as they are available. The fastest way to get your results is to activate your My Chart account. Instructions are located on the last page of this paperwork. If you have not heard from [place] regarding the results in 2 weeks, please contact this office.     Alcohol Use Disorder Alcohol use disorder is when your drinking disrupts your daily life. When you have this condition, you drink too much alcohol and you cannot control your drinking. Alcohol use disorder can cause serious problems with your physical health. It can affect your brain, heart, liver, pancreas, immune system, stomach, and intestines. Alcohol use disorder can increase your risk for certain cancers and cause problems with your mental health, such as depression, anxiety, psychosis, delirium, and dementia. People with this disorder risk hurting themselves and others. What are the causes? This condition is caused by drinking too much alcohol over time. It is not caused by drinking too much alcohol only one or two times. Some people with this condition drink alcohol to cope with or escape from negative life events. Others drink to relieve pain or symptoms of mental illness. What increases the risk? You are more likely to develop this condition if:  You have a family history of alcohol use disorder.  Your culture encourages drinking to the point of intoxication, or makes alcohol easy to get.  You had a mood or conduct disorder  in childhood.  You have been a victim of abuse.  You are an adolescent and: ? You have poor grades or difficulties in school. ? Your caregivers do not talk to you about saying no to alcohol, or supervise your activities. ? You are impulsive or you have trouble with self-control.  What are the signs or symptoms? Symptoms of this condition include:  Drinkingmore than you want to.  Drinking for longer than you want to.  Trying several times to drink less or to control your drinking.  Spending a lot of time getting alcohol, drinking, or recovering from drinking.  Craving alcohol.  Having problems at work, at school, or at home due to drinking.  Having problems in relationships due to drinking.  Drinking when it is dangerous to drink, such as before driving a car.  Continuing to drink even though you know you might have a physical or mental problem related to drinking.  Needing more and more alcohol to get the same effect you want from the alcohol (building up tolerance).  Having symptoms of withdrawal when you stop drinking. Symptoms of withdrawal include: ? Fatigue. ? Nightmares. ? Trouble sleeping. ? Depression. ? Anxiety. ? Fever. ? Seizures. ? Severe confusion. ? Feeling or seeing things that are not there (hallucinations). ? Tremors. ? Rapid heart rate. ? Rapid breathing. ? High blood pressure.  Drinking to avoid symptoms of withdrawal.  How is this diagnosed? This condition is diagnosed with an assessment. Your health care provider may start the assessment by asking three or four questions about your drinking. Your health care provider may  perform a physical exam or do lab tests to see if you have physical problems resulting from alcohol use. She or he may refer you to a mental health professional for evaluation. How is this treated? Some people with alcohol use disorder are able to reduce their alcohol use to low-risk levels. Others need to completely quit  drinking alcohol. When necessary, mental health professionals with specialized training in substance use treatment can help. Your health care provider can help you decide how severe your alcohol use disorder is and what type of treatment you need. The following forms of treatment are available:  Detoxification. Detoxification involves quitting drinking and using prescription medicines within the first week to help lessen withdrawal symptoms. This treatment is important for people who have had withdrawal symptoms before and for heavy drinkers who are likely to have withdrawal symptoms. Alcohol withdrawal can be dangerous, and in severe cases, it can cause death. Detoxification may be provided in a home, community, or primary care setting, or in a hospital or substance use treatment facility.  Counseling. This treatment is also called talk therapy. It is provided by substance use treatment counselors. A counselor can address the reasons you use alcohol and suggest ways to keep you from drinking again or to prevent problem drinking. The goals of talk therapy are to: ? Find healthy activities and ways for you to cope with stress. ? Identify and avoid the things that trigger your alcohol use. ? Help you learn how to handle cravings.  Medicines.Medicines can help treat alcohol use disorder by: ? Decreasing alcohol cravings. ? Decreasing the positive feeling you have when you drink alcohol. ? Causing an uncomfortable physical reaction when you drink alcohol (aversion therapy).  Support groups. Support groups are led by people who have quit drinking. They provide emotional support, advice, and guidance.  These forms of treatment are often combined. Some people with this condition benefit from a combination of treatments provided by specialized substance use treatment centers. Follow these instructions at home:  Take over-the-counter and prescription medicines only as told by your health care  provider.  Check with your health care provider before starting any new medicines.  Ask friends and family members not to offer you alcohol.  Avoid situations where alcohol is served, including gatherings where others are drinking alcohol.  Create a plan for what to do when you are tempted to use alcohol.  Find hobbies or activities that you enjoy that do not include alcohol.  Keep all follow-up visits as told by your health care provider. This is important. How is this prevented?  If you drink, limit alcohol intake to no more than 1 drink a day for nonpregnant women and 2 drinks a day for men. One drink equals 12 oz of beer, 5 oz of wine, or 1 oz of hard liquor.  If you have a mental health condition, get treatment and support.  Do not give alcohol to adolescents.  If you are an adolescent: ? Do not drink alcohol. ? Do not be afraid to say no if someone offers you alcohol. Speak up about why you do not want to drink. You can be a positive role model for your friends and set a good example for those around you by not drinking alcohol. ? If your friends drink, spend time with others who do not drink alcohol. Make new friends who do not use alcohol. ? Find healthy ways to manage stress and emotions, such as meditation or deep breathing, exercise, spending  time in nature, listening to music, or talking with a trusted friend or family member. Contact a health care provider if:  You are not able to take your medicines as told.  Your symptoms get worse.  You return to drinking alcohol (relapse) and your symptoms get worse. Get help right away if:  You have thoughts about hurting yourself or others. If you ever feel like you may hurt yourself or others, or have thoughts about taking your own life, get help right away. You can go to your nearest emergency department or call:  Your local emergency services (911 in the [place]  A suicide crisis helpline, such as the [place] at 380-408-9402. This is open 24 hours a day.  Summary  Alcohol use disorder is when your drinking disrupts your daily life. When you have this condition, you drink too much alcohol and you cannot control your drinking.  Treatment may include detoxification, counseling, medicine, and support groups.  Ask friends and family members not to offer you alcohol. Avoid situations where alcohol is served.  Get help right away if you have thoughts about hurting yourself or others. This information is not intended to replace advice given to you by your health care provider. Make sure you discuss any questions you have with your health care provider. Document Released: 10/07/2004 Document Revised: 05/27/2016 Document Reviewed: 05/27/2016 Elsevier Interactive Patient Education  [employer].

## 2017-10-19 NOTE — Progress Notes (Signed)
Chief Complaint  Patient presents with  . Hypertension    3 month f/u  . Alcohol Intoxication    3 month f/u    HPI   Hypertension: Patient here for follow-up of elevated blood pressure. She is not exercising and is adherent to low salt diet.  Blood pressure is well controlled at home. Cardiac symptoms chest pain, dyspnea, fatigue and lower extremity edema. Patient denies chest pain, chest pressure/discomfort, claudication, exertional chest pressure/discomfort, fatigue and irregular heart beat.  Cardiovascular risk factors: hypertension. Use of agents associated with hypertension: none. History of target organ damage: none. BP Readings from Last 3 Encounters:  10/19/17 113/80  07/13/17 122/84  05/23/17 129/86   Wt Readings from Last 3 Encounters:  10/19/17 199 lb 6.4 oz (90.4 kg)  07/13/17 195 lb 6.4 oz (88.6 kg)  05/23/17 199 lb 3.2 oz (90.4 kg)   Lab Results  Component Value Date   CREATININE 0.85 01/07/2017   Alcohol Use and Depression Pt reports that she had some relapses over the holiday.  Employee health was seeing her but she was told to get help for her alcohol abuse at the [facility] and has not gone back She states that she still has alcohol now and again She reports that her team has a standing social event after work She states that she had shots after work She states that her husband is on the transplant list and will be going to the [facility] She reports that her husband will die if he has alcohol She had alcohol overnight and took a couple shots of vodka. She reports that her work is not impacted by her drinking.   Depression screen [facility] 2/9 10/19/2017 07/13/2017 05/23/2017 05/03/2017 01/07/2017  Decreased Interest 0 0 0 2 0  Down, Depressed, Hopeless 0 0 0 1 0  PHQ - 2 Score 0 0 0 3 0  Altered sleeping - - - 3 -  Tired, decreased energy - - - 3 -  Change in appetite - - - 3 -  Feeling bad or failure about yourself  - - - 2 -  Trouble concentrating - - - 1  -  Moving slowly or fidgety/restless - - - 1 -  Suicidal thoughts - - - 0 -  PHQ-9 Score - - - 16 -  Difficult doing work/chores - - - Somewhat difficult -     Past Medical History:  Diagnosis Date  . Anxiety   . Arthritis   . Congenital patella maltracking right knee  . Depression   . Diverticulitis    perforated; required partial colectomy  . GERD (gastroesophageal reflux disease)    takes Zantac daily  . Perforated diverticulitis   . Peripheral edema    takes HCTZ daily as needed  . Pleural effusion, bilateral 11/28/11  . Tachyarrhythmia    s/p ablation-takes Metoprolol daily    Current Outpatient Medications  Medication Sig Dispense Refill  . Biotin 1000 MCG tablet Take 1,000 mcg by mouth 3 (three) times daily.    . chlordiazePOXIDE (LIBRIUM) 10 MG capsule Take 1 capsule (10 mg total) by mouth 3 (three) times daily as needed for anxiety. 90 capsule 0  . citalopram (CELEXA) 20 MG tablet Take 1 tablet (20 mg total) by mouth daily. 30 tablet 3  . hydrochlorothiazide (HYDRODIURIL) 25 MG tablet Take 1 tablet (25 mg total) by mouth daily. 90 tablet 1  . HYDROcodone-acetaminophen (NORCO/VICODIN) 5-325 MG tablet Take 1 tablet by mouth every 6 (six) hours as  needed for moderate pain.    Marland Kitchen ibuprofen (ADVIL,MOTRIN) 600 MG tablet Take 1 tablet (600 mg total) by mouth every 6 (six) hours as needed. 30 tablet 0  . LUTEIN PO Take by mouth.    . metoprolol succinate (TOPROL-XL) 25 MG 24 hr tablet Take 1 tablet (25 mg total) by mouth daily. 90 tablet 1  . Multiple Vitamin (MULTIVITAMIN) tablet Take 1 tablet by mouth every evening.     . raloxifene (EVISTA) 60 MG tablet Take 60 mg by mouth daily.    . ranitidine (ZANTAC) 150 MG tablet Take 150 mg by mouth every evening.    Marland Kitchen acetaminophen (TYLENOL) 325 MG tablet Take 325-650 mg by mouth every 4 (four) hours as needed for moderate pain or headache.    . CycloSPORINE (RESTASIS OP) Apply to eye.    Marland Kitchen omeprazole (PRILOSEC) 20 MG capsule Take 20  mg by mouth daily.     No current facility-administered medications for this visit.     Allergies:  Allergies  Allergen Reactions  . No Known Allergies     Past Surgical History:  Procedure Laterality Date  . CARDIAC ELECTROPHYSIOLOGY STUDY AND ABLATION  2006   tachycardia  . CLOSED REDUCTION NASAL FRACTURE N/A 05/20/2016   Procedure: CLOSED REDUCTION NASAL FRACTURE AND REPAIR OF NOSE AND LIP LACERATION;  Surgeon: Loel Lofty Dillingham, DO;  Location: [facility];  Service: Plastics;  Laterality: N/A;  . COLON SURGERY    . COSMETIC SURGERY  05/2016  . [employer]  . EXPLORATORY LAPAROTOMY  GYN fertility 1990's  . EYE SURGERY    . GANGLION CYST EXCISION Left 1997  . HERNIA REPAIR  1988  . KNEE SURGERY Right 2012   lateral release  . LASIK    . left small fingernail removed  2006  . PARTIAL COLECTOMY  01/28/12  . PILONIDAL CYST EXCISION  1983  . RHINOPLASTY    . SHOULDER ARTHROSCOPY W/ ROTATOR CUFF REPAIR Right 2010 repair bicep    Social History   Socioeconomic History  . Marital status: Married    Spouse name: None  . Number of children: None  . Years of education: None  . Highest education level: None  Social Needs  . Financial resource strain: None  . Food insecurity - worry: None  . Food insecurity - inability: None  . Transportation needs - medical: None  . Transportation needs - non-medical: None  Occupational History  . None  Tobacco Use  . Smoking status: Former Smoker    Packs/day: 0.50    Years: 32.00    Pack years: 16.00    Types: Cigarettes  . Smokeless tobacco: Never Used  . Tobacco comment: hasn't smoked since 05/10/16  Substance and Sexual Activity  . Alcohol use: Yes    Alcohol/week: 1.2 oz    Types: 1 Glasses of wine, 1 Cans of beer per week    Comment: 1 pint vodka a day  . Drug use: No  . Sexual activity: None  Other Topics Concern  . None  Social History Narrative  . None    Family History  Problem Relation Age of  Onset  . Heart failure Mother   . Stroke Mother   . Cancer Father 50       brain tumor  . Stomach cancer Paternal Uncle   . Colon cancer Paternal Uncle   . Stomach cancer Cousin   . Esophageal cancer Neg Hx   . Rectal cancer Neg Hx  ROS Review of Systems See HPI Constitution: No fevers or chills No malaise No diaphoresis Skin: No rash or itching Eyes: no blurry vision, no double vision GU: no dysuria or hematuria Neuro: no dizziness or headaches all others reviewed and negative   Objective: Vitals:   10/19/17 1202  BP: 113/80  Pulse: 85  Resp: 16  Temp: 98 F (36.7 C)  TempSrc: Oral  SpO2: 96%  Weight: 199 lb 6.4 oz (90.4 kg)  Height: 5' 6.5" (1.689 m)    Physical Exam  Constitutional: She is oriented to person, place, and time. She appears well-developed and well-nourished.  Slurred speech  HENT:  Head: Normocephalic and atraumatic.  Eyes: Conjunctivae and EOM are normal.  Cardiovascular: Normal rate, regular rhythm and normal heart sounds.  No murmur heard. Pulmonary/Chest: Effort normal and breath sounds normal. No stridor. No respiratory distress.  Neurological: She is alert and oriented to person, place, and time.  Skin: Skin is warm. Capillary refill takes less than 2 seconds.  Psychiatric: She has a normal mood and affect. Her behavior is normal. Judgment and thought content normal.    Assessment and Plan Arta was seen today for hypertension and alcohol intoxication.  Diagnoses and all orders for this visit:  Alcoholism [facility])  Essential hypertension  Moderate major depression ([place])   Discussed her alcoholism She is contemplating change She was told about [facility] She was given information to call for counseling Refilled celexa for comorbid depression   A total of 30 minutes were spent face-to-face with the patient during this encounter and over half of that time was spent on counseling and coordination of care.   [place]

## 2017-10-26 DIAGNOSIS — F1020 Alcohol dependence, uncomplicated: Secondary | ICD-10-CM | POA: Diagnosis not present

## 2017-10-27 MED FILL — CHLORDIAZEPOXIDE 25 MG CAP: 25 | 7 days supply | Qty: 18 | Fill #0

## 2017-10-27 MED FILL — GABAPENTIN 300 MG CAPS: 300 | 5 days supply | Qty: 10 | Fill #0

## 2017-10-27 MED FILL — ONDANSETRON HCL 4 MG TABLET: 4 | 5 days supply | Qty: 20 | Fill #0

## 2017-10-27 MED FILL — clonazePAM 0.5 MG TABS: 0.5 | 5 days supply | Qty: 15 | Fill #0

## 2017-10-31 DIAGNOSIS — F1020 Alcohol dependence, uncomplicated: Secondary | ICD-10-CM | POA: Diagnosis not present

## 2017-11-01 ENCOUNTER — Telehealth: Payer: Self-pay | Admitting: Family Medicine

## 2017-11-01 DIAGNOSIS — F1020 Alcohol dependence, uncomplicated: Secondary | ICD-10-CM | POA: Diagnosis not present

## 2017-11-01 MED FILL — GABAPENTIN 300 MG CAPS: 300 | 7 days supply | Qty: 7 | Fill #0

## 2017-11-01 MED FILL — CHLORDIAZEPOXIDE 25 MG CAP: 25 | 7 days supply | Qty: 7 | Fill #0

## 2017-11-01 MED FILL — clonazePAM 0.5 MG TABS: 0.5 | 7 days supply | Qty: 14 | Fill #0

## 2017-11-01 NOTE — Telephone Encounter (Signed)
Called and spoke with pt to confirm apt tomorrow 11/02/17. Advised of time, building # and time policies. °

## 2017-11-02 ENCOUNTER — Other Ambulatory Visit: Payer: Self-pay

## 2017-11-02 ENCOUNTER — Ambulatory Visit (INDEPENDENT_AMBULATORY_CARE_PROVIDER_SITE_OTHER): Payer: 59 | Admitting: Family Medicine

## 2017-11-02 ENCOUNTER — Encounter: Payer: Self-pay | Admitting: Family Medicine

## 2017-11-02 VITALS — BP 120/68 | HR 91 | Temp 98.2°F | Resp 17 | Ht 66.5 in | Wt 198.2 lb

## 2017-11-02 DIAGNOSIS — F1020 Alcohol dependence, uncomplicated: Secondary | ICD-10-CM

## 2017-11-02 DIAGNOSIS — F321 Major depressive disorder, single episode, moderate: Secondary | ICD-10-CM

## 2017-11-02 NOTE — Patient Instructions (Addendum)
IF you received an x-ray today, you will receive an invoice from [facility] Radiology. Please contact [facility]iology at [phone] with questions or concerns regarding your invoice.   IF you received labwork today, you will receive an invoice from [facility]. Please contact [facility] at [phone] with questions or concerns regarding your invoice.   Our billing staff will not be able to assist you with questions regarding bills from these companies.  You will be contacted with the lab results as soon as they are available. The fastest way to get your results is to activate your My Chart account. Instructions are located on the last page of this paperwork. If you have not heard from [place] regarding the results in 2 weeks, please contact this office.     Alcohol Use Disorder Alcohol use disorder is when your drinking disrupts your daily life. When you have this condition, you drink too much alcohol and you cannot control your drinking. Alcohol use disorder can cause serious problems with your physical health. It can affect your brain, heart, liver, pancreas, immune system, stomach, and intestines. Alcohol use disorder can increase your risk for certain cancers and cause problems with your mental health, such as depression, anxiety, psychosis, delirium, and dementia. People with this disorder risk hurting themselves and others. What are the causes? This condition is caused by drinking too much alcohol over time. It is not caused by drinking too much alcohol only one or two times. Some people with this condition drink alcohol to cope with or escape from negative life events. Others drink to relieve pain or symptoms of mental illness. What increases the risk? You are more likely to develop this condition if:  You have a family history of alcohol use disorder.  Your culture encourages drinking to the point of intoxication, or makes alcohol easy to get.  You had a mood or conduct disorder  in childhood.  You have been a victim of abuse.  You are an adolescent and: ? You have poor grades or difficulties in school. ? Your caregivers do not talk to you about saying no to alcohol, or supervise your activities. ? You are impulsive or you have trouble with self-control.  What are the signs or symptoms? Symptoms of this condition include:  Drinkingmore than you want to.  Drinking for longer than you want to.  Trying several times to drink less or to control your drinking.  Spending a lot of time getting alcohol, drinking, or recovering from drinking.  Craving alcohol.  Having problems at work, at school, or at home due to drinking.  Having problems in relationships due to drinking.  Drinking when it is dangerous to drink, such as before driving a car.  Continuing to drink even though you know you might have a physical or mental problem related to drinking.  Needing more and more alcohol to get the same effect you want from the alcohol (building up tolerance).  Having symptoms of withdrawal when you stop drinking. Symptoms of withdrawal include: ? Fatigue. ? Nightmares. ? Trouble sleeping. ? Depression. ? Anxiety. ? Fever. ? Seizures. ? Severe confusion. ? Feeling or seeing things that are not there (hallucinations). ? Tremors. ? Rapid heart rate. ? Rapid breathing. ? High blood pressure.  Drinking to avoid symptoms of withdrawal.  How is this diagnosed? This condition is diagnosed with an assessment. Your health care provider may start the assessment by asking three or four questions about your drinking. Your health care provider may  perform a physical exam or do lab tests to see if you have physical problems resulting from alcohol use. She or he may refer you to a mental health professional for evaluation. How is this treated? Some people with alcohol use disorder are able to reduce their alcohol use to low-risk levels. Others need to completely quit  drinking alcohol. When necessary, mental health professionals with specialized training in substance use treatment can help. Your health care provider can help you decide how severe your alcohol use disorder is and what type of treatment you need. The following forms of treatment are available:  Detoxification. Detoxification involves quitting drinking and using prescription medicines within the first week to help lessen withdrawal symptoms. This treatment is important for people who have had withdrawal symptoms before and for heavy drinkers who are likely to have withdrawal symptoms. Alcohol withdrawal can be dangerous, and in severe cases, it can cause death. Detoxification may be provided in a home, community, or primary care setting, or in a hospital or substance use treatment facility.  Counseling. This treatment is also called talk therapy. It is provided by substance use treatment counselors. A counselor can address the reasons you use alcohol and suggest ways to keep you from drinking again or to prevent problem drinking. The goals of talk therapy are to: ? Find healthy activities and ways for you to cope with stress. ? Identify and avoid the things that trigger your alcohol use. ? Help you learn how to handle cravings.  Medicines.Medicines can help treat alcohol use disorder by: ? Decreasing alcohol cravings. ? Decreasing the positive feeling you have when you drink alcohol. ? Causing an uncomfortable physical reaction when you drink alcohol (aversion therapy).  Support groups. Support groups are led by people who have quit drinking. They provide emotional support, advice, and guidance.  These forms of treatment are often combined. Some people with this condition benefit from a combination of treatments provided by specialized substance use treatment centers. Follow these instructions at home:  Take over-the-counter and prescription medicines only as told by your health care  provider.  Check with your health care provider before starting any new medicines.  Ask friends and family members not to offer you alcohol.  Avoid situations where alcohol is served, including gatherings where others are drinking alcohol.  Create a plan for what to do when you are tempted to use alcohol.  Find hobbies or activities that you enjoy that do not include alcohol.  Keep all follow-up visits as told by your health care provider. This is important. How is this prevented?  If you drink, limit alcohol intake to no more than 1 drink a day for nonpregnant women and 2 drinks a day for men. One drink equals 12 oz of beer, 5 oz of wine, or 1 oz of hard liquor.  If you have a mental health condition, get treatment and support.  Do not give alcohol to adolescents.  If you are an adolescent: ? Do not drink alcohol. ? Do not be afraid to say no if someone offers you alcohol. Speak up about why you do not want to drink. You can be a positive role model for your friends and set a good example for those around you by not drinking alcohol. ? If your friends drink, spend time with others who do not drink alcohol. Make new friends who do not use alcohol. ? Find healthy ways to manage stress and emotions, such as meditation or deep breathing, exercise, spending  time in nature, listening to music, or talking with a trusted friend or family member. Contact a health care provider if:  You are not able to take your medicines as told.  Your symptoms get worse.  You return to drinking alcohol (relapse) and your symptoms get worse. Get help right away if:  You have thoughts about hurting yourself or others. If you ever feel like you may hurt yourself or others, or have thoughts about taking your own life, get help right away. You can go to your nearest emergency department or call:  Your local emergency services (911 in the [place]  A suicide crisis helpline, such as the [facility] at 240-644-7406. This is open 24 hours a day.  Summary  Alcohol use disorder is when your drinking disrupts your daily life. When you have this condition, you drink too much alcohol and you cannot control your drinking.  Treatment may include detoxification, counseling, medicine, and support groups.  Ask friends and family members not to offer you alcohol. Avoid situations where alcohol is served.  Get help right away if you have thoughts about hurting yourself or others. This information is not intended to replace advice given to you by your health care provider. Make sure you discuss any questions you have with your health care provider. Document Released: 10/07/2004 Document Revised: 05/27/2016 Document Reviewed: 05/27/2016 Elsevier Interactive Patient Education  [employer].

## 2017-11-02 NOTE — Progress Notes (Signed)
Chief Complaint  Patient presents with  . Alcohol use    2 week follow up    HPI  Patient has been going to [facility]  She states that she has already been for a week to 4 sessions She reports that last night was family night where her support person came and they did a group activity with role playing. She states that since she started she has not had any alcohol She is taking the Celexa without side effects She has been sleeping well at night She reports that going to the classes will keep her busy and away from the temptation regarding hanging out with colleagues who drink alcohol to socialize.  She reports that the discussions gave her some things to think about She denies suicidal thoughts She denies any seizure activity She was started on librium, clonazepam and Gabapentin by the team at [facility].  She sees the nurse there weekly.  Depression screen [facility] 2/9 11/02/2017 10/19/2017 07/13/2017 05/23/2017 05/03/2017  Decreased Interest 0 0 0 0 2  Down, Depressed, Hopeless 0 0 0 0 1  PHQ - 2 Score 0 0 0 0 3  Altered sleeping - - - - 3  Tired, decreased energy - - - - 3  Change in appetite - - - - 3  Feeling bad or failure about yourself  - - - - 2  Trouble concentrating - - - - 1  Moving slowly or fidgety/restless - - - - 1  Suicidal thoughts - - - - 0  PHQ-9 Score - - - - 16  Difficult doing work/chores - - - - Somewhat difficult      Past Medical History:  Diagnosis Date  . Anxiety   . Arthritis   . Congenital patella maltracking right knee  . Depression   . Diverticulitis    perforated; required partial colectomy  . GERD (gastroesophageal reflux disease)    takes Zantac daily  . Perforated diverticulitis   . Peripheral edema    takes HCTZ daily as needed  . Pleural effusion, bilateral 11/28/11  . Tachyarrhythmia    s/p ablation-takes Metoprolol daily    Current Outpatient Medications  Medication Sig Dispense Refill  . Biotin 1000 MCG tablet  Take 1,000 mcg by mouth 3 (three) times daily.    . chlordiazePOXIDE (LIBRIUM) 10 MG capsule Take 1 capsule (10 mg total) by mouth 3 (three) times daily as needed for anxiety. 90 capsule 0  . citalopram (CELEXA) 20 MG tablet Take 1 tablet (20 mg total) by mouth daily. 30 tablet 3  . clonazePAM (KLONOPIN) 0.5 MG tablet Take 0.5 mg by mouth 3 (three) times daily as needed for anxiety.    . gabapentin (NEURONTIN) 300 MG capsule Take 300 mg by mouth 2 (two) times daily.    . hydrochlorothiazide (HYDRODIURIL) 25 MG tablet Take 1 tablet (25 mg total) by mouth daily. 90 tablet 1  . HYDROcodone-acetaminophen (NORCO/VICODIN) 5-325 MG tablet Take 1 tablet by mouth every 6 (six) hours as needed for moderate pain.    Marland Kitchen ibuprofen (ADVIL,MOTRIN) 600 MG tablet Take 1 tablet (600 mg total) by mouth every 6 (six) hours as needed. 30 tablet 0  . LUTEIN PO Take by mouth.    . Multiple Vitamin (MULTIVITAMIN) tablet Take 1 tablet by mouth every evening.     . metoprolol succinate (TOPROL-XL) 25 MG 24 hr tablet Take 1 tablet (25 mg total) by mouth daily. 90 tablet 1  . raloxifene (EVISTA) 60  MG tablet Take 60 mg by mouth daily.    . ranitidine (ZANTAC) 150 MG tablet Take 150 mg by mouth every evening.     No current facility-administered medications for this visit.     Allergies:  Allergies  Allergen Reactions  . No Known Allergies     Past Surgical History:  Procedure Laterality Date  . CARDIAC ELECTROPHYSIOLOGY STUDY AND ABLATION  2006   tachycardia  . CLOSED REDUCTION NASAL FRACTURE N/A 05/20/2016   Procedure: CLOSED REDUCTION NASAL FRACTURE AND REPAIR OF NOSE AND LIP LACERATION;  Surgeon: Loel Lofty Dillingham, DO;  Location: [facility];  Service: Plastics;  Laterality: N/A;  . COLON SURGERY    . COSMETIC SURGERY  05/2016  . [place]  . EXPLORATORY LAPAROTOMY  GYN fertility 1990's  . EYE SURGERY    . GANGLION CYST EXCISION Left 1997  . HERNIA REPAIR  1988  . KNEE SURGERY Right 2012    lateral release  . LASIK    . left small fingernail removed  2006  . PARTIAL COLECTOMY  01/28/12  . PILONIDAL CYST EXCISION  1983  . RHINOPLASTY    . SHOULDER ARTHROSCOPY W/ ROTATOR CUFF REPAIR Right 2010 repair bicep    Social History   Socioeconomic History  . Marital status: Married    Spouse name: None  . Number of children: None  . Years of education: None  . Highest education level: None  Social Needs  . Financial resource strain: None  . Food insecurity - worry: None  . Food insecurity - inability: None  . Transportation needs - medical: None  . Transportation needs - non-medical: None  Occupational History  . None  Tobacco Use  . Smoking status: Former Smoker    Packs/day: 0.50    Years: 32.00    Pack years: 16.00    Types: Cigarettes  . Smokeless tobacco: Never Used  . Tobacco comment: hasn't smoked since 05/10/16  Substance and Sexual Activity  . Alcohol use: Yes    Alcohol/week: 1.2 oz    Types: 1 Glasses of wine, 1 Cans of beer per week    Comment: 1 pint vodka a day  . Drug use: No  . Sexual activity: None  Other Topics Concern  . None  Social History Narrative  . None    Family History  Problem Relation Age of Onset  . Heart failure Mother   . Stroke Mother   . Cancer Father 44       brain tumor  . Stomach cancer Paternal Uncle   . Colon cancer Paternal Uncle   . Stomach cancer Cousin   . Esophageal cancer Neg Hx   . Rectal cancer Neg Hx      ROS Review of Systems See HPI Constitution: No fevers or chills No malaise No diaphoresis Skin: No rash or itching Eyes: no blurry vision, no double vision GU: no dysuria or hematuria Neuro: no dizziness or headaches all others reviewed and negative   Objective: Vitals:   11/02/17 1121  BP: 120/68  Pulse: 91  Resp: 17  Temp: 98.2 F (36.8 C)  TempSrc: Oral  SpO2: 96%  Weight: 198 lb 3.2 oz (89.9 kg)  Height: 5' 6.5" (1.689 m)    Physical Exam  Constitutional: She is oriented to  person, place, and time. She appears well-developed and well-nourished.  HENT:  Head: Normocephalic and atraumatic.  Eyes: Conjunctivae and EOM are normal.  Cardiovascular: Normal rate, regular rhythm and normal  heart sounds.  No murmur heard. Pulmonary/Chest: Effort normal and breath sounds normal. No stridor. No respiratory distress.  Neurological: She is alert and oriented to person, place, and time.  Skin: Skin is warm. Capillary refill takes less than 2 seconds.  Psychiatric: She has a normal mood and affect. Her behavior is normal. Judgment and thought content normal.    Assessment and Plan Rosely was seen today for alcohol use.  Diagnoses and all orders for this visit:  Alcoholism ([place])  Moderate major depression ([place])  Discussed other ways to socialize away from settings with alcohol  Continue the gabapentin, clonazepam and librium as instructed by [facility] team Continue abstinence Exercise to help mood Follow up in 2-3 weeks   A total of 25 minutes were spent face-to-face with the patient during this encounter and over half of that time was spent on counseling and coordination of care.  [place]

## 2017-11-04 DIAGNOSIS — F1020 Alcohol dependence, uncomplicated: Secondary | ICD-10-CM | POA: Diagnosis not present

## 2017-11-04 MED FILL — HYDROCODON-APAP 5-325: 5-325 | 30 days supply | Qty: 60 | Fill #0

## 2017-11-07 DIAGNOSIS — F1020 Alcohol dependence, uncomplicated: Secondary | ICD-10-CM | POA: Diagnosis not present

## 2017-11-08 DIAGNOSIS — F1020 Alcohol dependence, uncomplicated: Secondary | ICD-10-CM | POA: Diagnosis not present

## 2017-11-09 DIAGNOSIS — F1020 Alcohol dependence, uncomplicated: Secondary | ICD-10-CM | POA: Diagnosis not present

## 2017-11-09 MED FILL — clonazePAM 0.5 MG TABS: 0.5 | 7 days supply | Qty: 7 | Fill #0

## 2017-11-09 MED FILL — CHLORDIAZEPOXIDE 10 MG CAP: 10 | 7 days supply | Qty: 7 | Fill #0

## 2017-11-09 MED FILL — DISULFIRAM 250 MG TABLET: 250 | 7 days supply | Qty: 7 | Fill #0

## 2017-11-11 DIAGNOSIS — F1020 Alcohol dependence, uncomplicated: Secondary | ICD-10-CM | POA: Diagnosis not present

## 2017-11-14 DIAGNOSIS — Z6832 Body mass index (BMI) 32.0-32.9, adult: Secondary | ICD-10-CM | POA: Diagnosis not present

## 2017-11-14 DIAGNOSIS — Z01419 Encounter for gynecological examination (general) (routine) without abnormal findings: Secondary | ICD-10-CM | POA: Diagnosis not present

## 2017-11-14 DIAGNOSIS — F1020 Alcohol dependence, uncomplicated: Secondary | ICD-10-CM | POA: Diagnosis not present

## 2017-11-14 MED FILL — RALOXIFENE HCL 60 MG TABLET: 60 | 90 days supply | Qty: 90 | Fill #0

## 2017-11-15 ENCOUNTER — Other Ambulatory Visit: Payer: Self-pay | Admitting: Obstetrics & Gynecology

## 2017-11-15 DIAGNOSIS — M810 Age-related osteoporosis without current pathological fracture: Secondary | ICD-10-CM

## 2017-11-15 DIAGNOSIS — F1020 Alcohol dependence, uncomplicated: Secondary | ICD-10-CM | POA: Diagnosis not present

## 2017-11-15 MED FILL — ZOLPIDEM TARTRATE 5 MG TABL: 5 | 10 days supply | Qty: 10 | Fill #0

## 2017-11-15 MED FILL — clonazePAM 0.5 MG TABS: 0.5 | 14 days supply | Qty: 14 | Fill #0

## 2017-11-15 MED FILL — CHLORDIAZEPOXIDE 5 MG CAP: 5 | 14 days supply | Qty: 14 | Fill #0

## 2017-11-16 DIAGNOSIS — F1110 Opioid abuse, uncomplicated: Secondary | ICD-10-CM | POA: Diagnosis not present

## 2017-11-16 DIAGNOSIS — F1220 Cannabis dependence, uncomplicated: Secondary | ICD-10-CM | POA: Diagnosis not present

## 2017-11-16 DIAGNOSIS — F1020 Alcohol dependence, uncomplicated: Secondary | ICD-10-CM | POA: Diagnosis not present

## 2017-11-18 DIAGNOSIS — F1020 Alcohol dependence, uncomplicated: Secondary | ICD-10-CM | POA: Diagnosis not present

## 2017-11-21 DIAGNOSIS — F1110 Opioid abuse, uncomplicated: Secondary | ICD-10-CM | POA: Diagnosis not present

## 2017-11-21 DIAGNOSIS — F1020 Alcohol dependence, uncomplicated: Secondary | ICD-10-CM | POA: Diagnosis not present

## 2017-11-21 DIAGNOSIS — F1220 Cannabis dependence, uncomplicated: Secondary | ICD-10-CM | POA: Diagnosis not present

## 2017-11-22 DIAGNOSIS — F1020 Alcohol dependence, uncomplicated: Secondary | ICD-10-CM | POA: Diagnosis not present

## 2017-11-22 NOTE — Progress Notes (Signed)
Chief Complaint  Patient presents with  . Hypertension    follow up     HPI   Hypertension: Patient here for follow-up of elevated blood pressure. She is exercising and is adherent to low salt diet.  Blood pressure is well controlled at home. Cardiac symptoms none. Patient denies chest pain, chest pressure/discomfort, claudication, dyspnea, exertional chest pressure/discomfort, fatigue and irregular heart beat.  Cardiovascular risk factors: hypertension and sedentary lifestyle. Use of agents associated with hypertension: none. History of target organ damage: none. BP Readings from Last 3 Encounters:  11/23/17 124/76  11/02/17 120/68  10/19/17 113/80   Alcohol  Pt reports that she has been 8 days without alcohol She reports that she has been sober now without withdrawals She is taking the chlordiazepoxide 5mg  once a day down from 10mg  tid She states that she has not taken the [place] for sleep She was also given antabuse She is making plans to go out with her friends on Saturday and will definitely go out with her friend but will take an antabuse pill first   Depression screen [facility] 2/9 11/02/2017 10/19/2017 07/13/2017 05/23/2017 05/03/2017  Decreased Interest 0 0 0 0 2  Down, Depressed, Hopeless 0 0 0 0 1  PHQ - 2 Score 0 0 0 0 3  Altered sleeping - - - - 3  Tired, decreased energy - - - - 3  Change in appetite - - - - 3  Feeling bad or failure about yourself  - - - - 2  Trouble concentrating - - - - 1  Moving slowly or fidgety/restless - - - - 1  Suicidal thoughts - - - - 0  PHQ-9 Score - - - - 16  Difficult doing work/chores - - - - Somewhat difficult      Past Medical History:  Diagnosis Date  . Anxiety   . Arthritis   . Congenital patella maltracking right knee  . Depression   . Diverticulitis    perforated; required partial colectomy  . GERD (gastroesophageal reflux disease)    takes Zantac daily  . Perforated diverticulitis   . Peripheral edema    takes HCTZ daily  as needed  . Pleural effusion, bilateral 11/28/11  . Tachyarrhythmia    s/p ablation-takes Metoprolol daily    Current Outpatient Medications  Medication Sig Dispense Refill  . Biotin 1000 MCG tablet Take 1,000 mcg by mouth 3 (three) times daily.    . chlordiazePOXIDE (LIBRIUM) 10 MG capsule Take 1 capsule (10 mg total) by mouth 3 (three) times daily as needed for anxiety. (Patient taking differently: Take 5 mg by mouth daily. ) 90 capsule 0  . clonazePAM (KLONOPIN) 0.5 MG tablet Take 0.5 mg by mouth daily.     . hydrochlorothiazide (HYDRODIURIL) 25 MG tablet Take 0.5 tablets (12.5 mg total) by mouth daily. 90 tablet 1  . HYDROcodone-acetaminophen (NORCO/VICODIN) 5-325 MG tablet Take 1 tablet by mouth every 6 (six) hours as needed for moderate pain.    Marland Kitchen ibuprofen (ADVIL,MOTRIN) 600 MG tablet Take 1 tablet (600 mg total) by mouth every 6 (six) hours as needed. 30 tablet 0  . LUTEIN PO Take by mouth.    . metoprolol succinate (TOPROL-XL) 25 MG 24 hr tablet Take 1 tablet (25 mg total) by mouth daily. 90 tablet 1  . Multiple Vitamin (MULTIVITAMIN) tablet Take 1 tablet by mouth every evening.     . raloxifene (EVISTA) 60 MG tablet Take 60 mg by mouth daily.    Marland Kitchen  ranitidine (ZANTAC) 150 MG tablet Take 150 mg by mouth every evening.    . zolpidem (AMBIEN) 5 MG tablet Take 5 mg by mouth at bedtime as needed for sleep.    . citalopram (CELEXA) 20 MG tablet Take 1 tablet (20 mg total) by mouth daily. (Patient not taking: Reported on 11/23/2017) 30 tablet 3  . gabapentin (NEURONTIN) 300 MG capsule Take 300 mg by mouth 2 (two) times daily.     No current facility-administered medications for this visit.     Allergies:  Allergies  Allergen Reactions  . No Known Allergies     Past Surgical History:  Procedure Laterality Date  . CARDIAC ELECTROPHYSIOLOGY STUDY AND ABLATION  2006   tachycardia  . CLOSED REDUCTION NASAL FRACTURE N/A 05/20/2016   Procedure: CLOSED REDUCTION NASAL FRACTURE AND REPAIR  OF NOSE AND LIP LACERATION;  Surgeon: Loel Lofty Dillingham, DO;  Location: [facility];  Service: Plastics;  Laterality: N/A;  . COLON SURGERY    . COSMETIC SURGERY  05/2016  . [place]  . EXPLORATORY LAPAROTOMY  GYN fertility 1990's  . EYE SURGERY    . GANGLION CYST EXCISION Left 1997  . HERNIA REPAIR  1988  . KNEE SURGERY Right 2012   lateral release  . LASIK    . left small fingernail removed  2006  . PARTIAL COLECTOMY  01/28/12  . PILONIDAL CYST EXCISION  1983  . RHINOPLASTY    . SHOULDER ARTHROSCOPY W/ ROTATOR CUFF REPAIR Right 2010 repair bicep    Social History   Socioeconomic History  . Marital status: Married    Spouse name: None  . Number of children: None  . Years of education: None  . Highest education level: None  Social Needs  . Financial resource strain: None  . Food insecurity - worry: None  . Food insecurity - inability: None  . Transportation needs - medical: None  . Transportation needs - non-medical: None  Occupational History  . None  Tobacco Use  . Smoking status: Former Smoker    Packs/day: 0.50    Years: 32.00    Pack years: 16.00    Types: Cigarettes  . Smokeless tobacco: Never Used  . Tobacco comment: hasn't smoked since 05/10/16  Substance and Sexual Activity  . Alcohol use: Yes    Alcohol/week: 1.2 oz    Types: 1 Glasses of wine, 1 Cans of beer per week    Comment: 1 pint vodka a day  . Drug use: No  . Sexual activity: None  Other Topics Concern  . None  Social History Narrative  . None    Family History  Problem Relation Age of Onset  . Heart failure Mother   . Stroke Mother   . Cancer Father 68       brain tumor  . Stomach cancer Paternal Uncle   . Colon cancer Paternal Uncle   . Stomach cancer Cousin   . Esophageal cancer Neg Hx   . Rectal cancer Neg Hx      ROS Review of Systems See HPI Constitution: No fevers or chills No malaise No diaphoresis Skin: No rash or itching Eyes: no blurry vision, no  double vision GU: no dysuria or hematuria Neuro: no dizziness or headaches all others reviewed and negative   Objective: Vitals:   11/23/17 1118  BP: 124/76  Pulse: 81  Resp: 18  Temp: 98.4 F (36.9 C)  TempSrc: Oral  SpO2: 99%  Weight: 207 lb 12.8  oz (94.3 kg)  Height: 5' 6.5" (1.689 m)    Physical Exam  Constitutional: She is oriented to person, place, and time. She appears well-developed and well-nourished.  HENT:  Head: Normocephalic and atraumatic.  Eyes: Conjunctivae and EOM are normal.  Cardiovascular: Normal rate, regular rhythm and normal heart sounds.  Pulmonary/Chest: Effort normal and breath sounds normal. No stridor. No respiratory distress.  Neurological: She is alert and oriented to person, place, and time. She displays no tremor.  Skin: Skin is warm. Capillary refill takes less than 2 seconds.  Psychiatric: She has a normal mood and affect. Her behavior is normal. Judgment and thought content normal.     Assessment and Plan Lovie was seen today for hypertension.  Diagnoses and all orders for this visit:  Essential hypertension-  BP at goal, advised halfing the hctz and then stopping if her bp remains at goal. Needs bp check in one month -     hydrochlorothiazide (HYDRODIURIL) 25 MG tablet; Take 0.5 tablets (12.5 mg total) by mouth daily.  Alcoholism ([place])- continue with the [facility] T[place]ing of librium No signs of DT     Niana Martorana A Nidhi Jacome

## 2017-11-23 ENCOUNTER — Encounter: Payer: Self-pay | Admitting: Family Medicine

## 2017-11-23 ENCOUNTER — Ambulatory Visit: Payer: 59 | Admitting: Family Medicine

## 2017-11-23 ENCOUNTER — Other Ambulatory Visit: Payer: Self-pay

## 2017-11-23 VITALS — BP 124/76 | HR 81 | Temp 98.4°F | Resp 18 | Ht 66.5 in | Wt 207.8 lb

## 2017-11-23 DIAGNOSIS — F1020 Alcohol dependence, uncomplicated: Secondary | ICD-10-CM

## 2017-11-23 DIAGNOSIS — I10 Essential (primary) hypertension: Secondary | ICD-10-CM

## 2017-11-23 MED ORDER — HYDROCHLOROTHIAZIDE 25 MG PO TABS: 12.5000 mg | ORAL_TABLET | Freq: Every day | ORAL | 1 refills | Status: DC

## 2017-11-23 NOTE — Patient Instructions (Addendum)
Cut the hctz to 12.5mg  and check at pharmacy in 2 weeks and if it is still normal stop the 1/2 tablet. Once you have stopped the hydrochlorothiazide (hctz) completed for 2 weeks come in for a nurse visit for bp check    IF you received an x-ray today, you will receive an invoice from [facility] Rad[facility]. Please contact [facility]iology at [phone] with questions or concerns regarding your invoice.   IF you received labwork today, you will receive an invoice from [facility]. Please contact [facility] at [phone] with questions or concerns regarding your invoice.   Our billing staff will not be able to assist you with questions regarding bills from these companies.  You will be contacted with the lab results as soon as they are available. The fastest way to get your results is to activate your My Chart account. Instructions are located on the last page of this paperwork. If you have not heard from [place] regarding the results in 2 weeks, please contact this office.

## 2017-11-24 LAB — COMPREHENSIVE METABOLIC PANEL
ALT: 53 IU/L — ABNORMAL HIGH (ref 0–32)
AST: 49 IU/L — AB (ref 0–40)
Albumin/Globulin Ratio: 1.5 (ref 1.2–2.2)
Albumin: 4 g/dL (ref 3.5–5.5)
Alkaline Phosphatase: 146 IU/L — ABNORMAL HIGH (ref 39–117)
BUN / CREAT RATIO: 26 — AB (ref 9–23)
BUN: 21 mg/dL (ref 6–24)
Bilirubin Total: 0.4 mg/dL (ref 0.0–1.2)
CO2: 19 mmol/L — ABNORMAL LOW (ref 20–29)
Calcium: 9.4 mg/dL (ref 8.7–10.2)
Chloride: 103 mmol/L (ref 96–106)
Creatinine, Ser: 0.8 mg/dL (ref 0.57–1.00)
GFR calc Af Amer: 95 mL/min/{1.73_m2} (ref >59)
GFR calc non Af Amer: 82 mL/min/{1.73_m2} (ref >59)
GLOBULIN, TOTAL: 2.7 g/dL (ref 1.5–4.5)
Glucose: 82 mg/dL (ref 65–99)
Potassium: 4.2 mmol/L (ref 3.5–5.2)
SODIUM: 143 mmol/L (ref 134–144)
Total Protein: 6.7 g/dL (ref 6.0–8.5)

## 2017-11-25 ENCOUNTER — Ambulatory Visit
Admission: RE | Admit: 2017-11-25 | Discharge: 2017-11-25 | Disposition: A | Discharge status: Home / Self Care | Payer: 59 | Source: Ambulatory Visit | Attending: Obstetrics & Gynecology | Admitting: Obstetrics & Gynecology

## 2017-11-25 DIAGNOSIS — Z780 Asymptomatic menopausal state: Secondary | ICD-10-CM | POA: Diagnosis not present

## 2017-11-25 DIAGNOSIS — F1020 Alcohol dependence, uncomplicated: Secondary | ICD-10-CM | POA: Diagnosis not present

## 2017-11-25 DIAGNOSIS — M810 Age-related osteoporosis without current pathological fracture: Secondary | ICD-10-CM

## 2017-11-25 DIAGNOSIS — M85851 Other specified disorders of bone density and structure, right thigh: Secondary | ICD-10-CM | POA: Diagnosis not present

## 2017-11-28 DIAGNOSIS — F1020 Alcohol dependence, uncomplicated: Secondary | ICD-10-CM | POA: Diagnosis not present

## 2017-11-29 DIAGNOSIS — F1220 Cannabis dependence, uncomplicated: Secondary | ICD-10-CM | POA: Diagnosis not present

## 2017-11-29 DIAGNOSIS — F1020 Alcohol dependence, uncomplicated: Secondary | ICD-10-CM | POA: Diagnosis not present

## 2017-11-29 DIAGNOSIS — F1110 Opioid abuse, uncomplicated: Secondary | ICD-10-CM | POA: Diagnosis not present

## 2017-11-30 DIAGNOSIS — F1020 Alcohol dependence, uncomplicated: Secondary | ICD-10-CM | POA: Diagnosis not present

## 2017-12-02 DIAGNOSIS — F1020 Alcohol dependence, uncomplicated: Secondary | ICD-10-CM | POA: Diagnosis not present

## 2017-12-02 MED FILL — HYDROCODON-APAP 5-325: 5-325 | 30 days supply | Qty: 60 | Fill #0

## 2017-12-05 DIAGNOSIS — F1020 Alcohol dependence, uncomplicated: Secondary | ICD-10-CM | POA: Diagnosis not present

## 2017-12-05 MED FILL — CHLORDIAZEPOXIDE 5 MG CAP: 5 | 14 days supply | Qty: 14 | Fill #0

## 2017-12-05 MED FILL — traZODone HCL 100 MG TABS: 100 | 14 days supply | Qty: 14 | Fill #0

## 2017-12-05 MED FILL — clonazePAM 0.5 MG TABS: 0.5 | 14 days supply | Qty: 14 | Fill #0

## 2017-12-06 DIAGNOSIS — F1020 Alcohol dependence, uncomplicated: Secondary | ICD-10-CM | POA: Diagnosis not present

## 2017-12-07 DIAGNOSIS — F1020 Alcohol dependence, uncomplicated: Secondary | ICD-10-CM | POA: Diagnosis not present

## 2017-12-09 DIAGNOSIS — H16223 Keratoconjunctivitis sicca, not specified as Sjogren's, bilateral: Secondary | ICD-10-CM | POA: Diagnosis not present

## 2017-12-09 DIAGNOSIS — F1110 Opioid abuse, uncomplicated: Secondary | ICD-10-CM | POA: Diagnosis not present

## 2017-12-09 DIAGNOSIS — H04123 Dry eye syndrome of bilateral lacrimal glands: Secondary | ICD-10-CM | POA: Diagnosis not present

## 2017-12-09 DIAGNOSIS — F1220 Cannabis dependence, uncomplicated: Secondary | ICD-10-CM | POA: Diagnosis not present

## 2017-12-09 DIAGNOSIS — F1020 Alcohol dependence, uncomplicated: Secondary | ICD-10-CM | POA: Diagnosis not present

## 2017-12-09 DIAGNOSIS — H16143 Punctate keratitis, bilateral: Secondary | ICD-10-CM | POA: Diagnosis not present

## 2017-12-12 DIAGNOSIS — M25561 Pain in right knee: Secondary | ICD-10-CM | POA: Diagnosis not present

## 2017-12-12 DIAGNOSIS — G894 Chronic pain syndrome: Secondary | ICD-10-CM | POA: Diagnosis not present

## 2017-12-12 DIAGNOSIS — F1020 Alcohol dependence, uncomplicated: Secondary | ICD-10-CM | POA: Diagnosis not present

## 2017-12-12 MED FILL — LOTEMAX 0.5% GEL: 0.5 | 12 days supply | Qty: 5 | Fill #0

## 2017-12-13 DIAGNOSIS — F1220 Cannabis dependence, uncomplicated: Secondary | ICD-10-CM | POA: Diagnosis not present

## 2017-12-13 DIAGNOSIS — F1110 Opioid abuse, uncomplicated: Secondary | ICD-10-CM | POA: Diagnosis not present

## 2017-12-13 DIAGNOSIS — F1020 Alcohol dependence, uncomplicated: Secondary | ICD-10-CM | POA: Diagnosis not present

## 2017-12-14 DIAGNOSIS — H16223 Keratoconjunctivitis sicca, not specified as Sjogren's, bilateral: Secondary | ICD-10-CM | POA: Diagnosis not present

## 2017-12-14 DIAGNOSIS — H04123 Dry eye syndrome of bilateral lacrimal glands: Secondary | ICD-10-CM | POA: Diagnosis not present

## 2017-12-14 DIAGNOSIS — F1020 Alcohol dependence, uncomplicated: Secondary | ICD-10-CM | POA: Diagnosis not present

## 2017-12-14 DIAGNOSIS — H16143 Punctate keratitis, bilateral: Secondary | ICD-10-CM | POA: Diagnosis not present

## 2017-12-16 DIAGNOSIS — F1020 Alcohol dependence, uncomplicated: Secondary | ICD-10-CM | POA: Diagnosis not present

## 2017-12-19 ENCOUNTER — Encounter: Payer: Self-pay | Admitting: Family Medicine

## 2017-12-19 DIAGNOSIS — F1020 Alcohol dependence, uncomplicated: Secondary | ICD-10-CM | POA: Diagnosis not present

## 2017-12-19 DIAGNOSIS — F1220 Cannabis dependence, uncomplicated: Secondary | ICD-10-CM | POA: Diagnosis not present

## 2017-12-19 DIAGNOSIS — F1110 Opioid abuse, uncomplicated: Secondary | ICD-10-CM | POA: Diagnosis not present

## 2017-12-20 DIAGNOSIS — F1020 Alcohol dependence, uncomplicated: Secondary | ICD-10-CM | POA: Diagnosis not present

## 2017-12-21 ENCOUNTER — Ambulatory Visit: Payer: 59

## 2017-12-21 ENCOUNTER — Ambulatory Visit: Payer: 59 | Admitting: Family Medicine

## 2017-12-21 DIAGNOSIS — F1020 Alcohol dependence, uncomplicated: Secondary | ICD-10-CM | POA: Diagnosis not present

## 2017-12-23 DIAGNOSIS — F1020 Alcohol dependence, uncomplicated: Secondary | ICD-10-CM | POA: Diagnosis not present

## 2017-12-26 DIAGNOSIS — F1020 Alcohol dependence, uncomplicated: Secondary | ICD-10-CM | POA: Diagnosis not present

## 2017-12-26 DIAGNOSIS — F1110 Opioid abuse, uncomplicated: Secondary | ICD-10-CM | POA: Diagnosis not present

## 2017-12-26 DIAGNOSIS — F1220 Cannabis dependence, uncomplicated: Secondary | ICD-10-CM | POA: Diagnosis not present

## 2017-12-26 MED FILL — clonazePAM 0.5 MG TABS: 0.5 | 30 days supply | Qty: 60 | Fill #0

## 2017-12-26 MED FILL — traZODone HCL 100 MG TABS: 100 | 30 days supply | Qty: 30 | Fill #0

## 2017-12-26 MED FILL — XIIDRA 5% EYE DROPS: 5 | 30 days supply | Qty: 60 | Fill #0

## 2017-12-27 DIAGNOSIS — F1020 Alcohol dependence, uncomplicated: Secondary | ICD-10-CM | POA: Diagnosis not present

## 2017-12-28 DIAGNOSIS — F1020 Alcohol dependence, uncomplicated: Secondary | ICD-10-CM | POA: Diagnosis not present

## 2017-12-30 MED FILL — PHENTERMINE 37.5 MG TABLET: 37.5 | 30 days supply | Qty: 30 | Fill #0

## 2017-12-30 MED FILL — NALTREXONE 50 MG TABLET: 50 | 30 days supply | Qty: 30 | Fill #0

## 2017-12-30 MED FILL — HYDROCODON-APAP 5-325: 5-325 | 30 days supply | Qty: 60 | Fill #0

## 2018-01-04 DIAGNOSIS — H16223 Keratoconjunctivitis sicca, not specified as Sjogren's, bilateral: Secondary | ICD-10-CM | POA: Diagnosis not present

## 2018-01-04 DIAGNOSIS — H16143 Punctate keratitis, bilateral: Secondary | ICD-10-CM | POA: Diagnosis not present

## 2018-01-04 DIAGNOSIS — H04123 Dry eye syndrome of bilateral lacrimal glands: Secondary | ICD-10-CM | POA: Diagnosis not present

## 2018-01-04 DIAGNOSIS — H10413 Chronic giant papillary conjunctivitis, bilateral: Secondary | ICD-10-CM | POA: Diagnosis not present

## 2018-01-23 MED FILL — XIIDRA 5% EYE DROPS: 5 | 30 days supply | Qty: 60 | Fill #1

## 2018-01-23 MED FILL — clonazePAM 0.5 MG TABS: 0.5 | 30 days supply | Qty: 60 | Fill #1

## 2018-01-24 IMAGING — CT CT HEAD W/O CM
3 of 6 series · 15 of 47 positions shown, 18 images · non-contrast
Comparison: Head CT scan 01/16/2008.

CLINICAL DATA: Status post trip and fall today with a blow to the
face on the sidewalk. Initial encounter.

EXAM:
CT HEAD WITHOUT CONTRAST
CT MAXILLOFACIAL WITHOUT CONTRAST
TECHNIQUE: Multidetector CT imaging of the head and maxillofacial structures
were performed using the standard protocol without intravenous
contrast. Multiplanar CT image reconstructions of the maxillofacial
structures were also generated.

[Series 4: cor head wo · coronal · 0.31mm/px · 3 of 71 slices shown]
[im 18/71  brain]
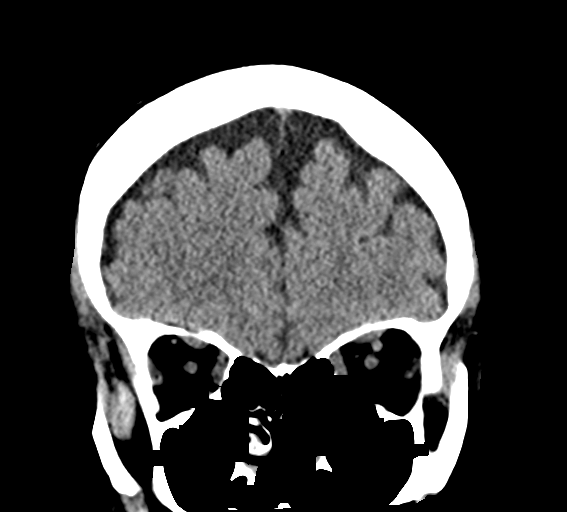
[im 36/71  brain]
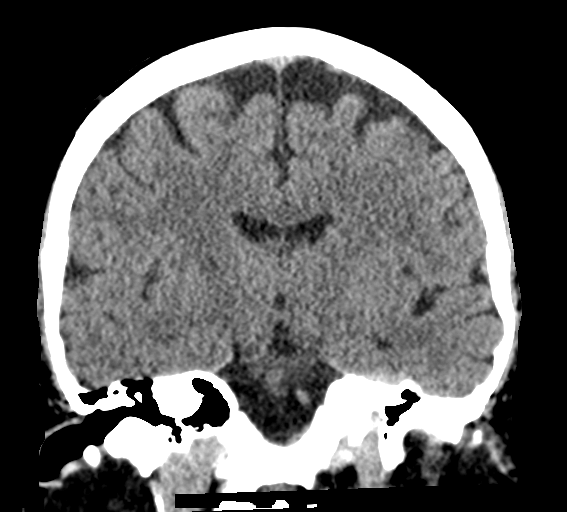
[im 53/71  brain]
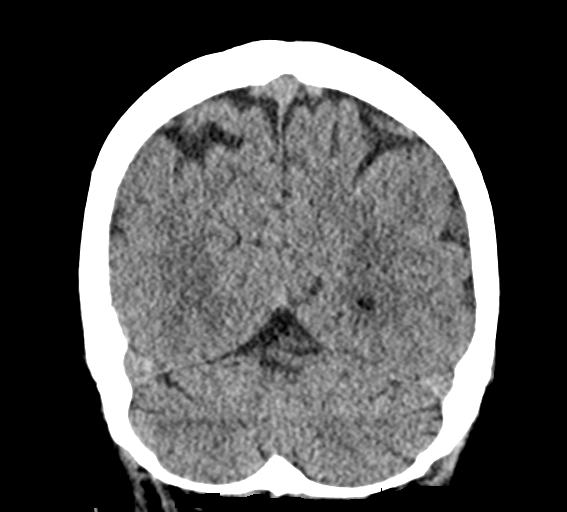

[Series 6: max soft · axial · 0.33mm/px · z∈[-238,-88]mm · 10 of 89 slices shown, 13 images]
[im 9/89  brain]
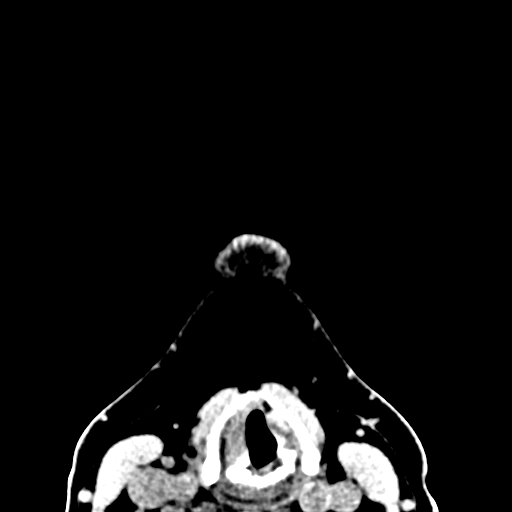
[im 9/89  bone]
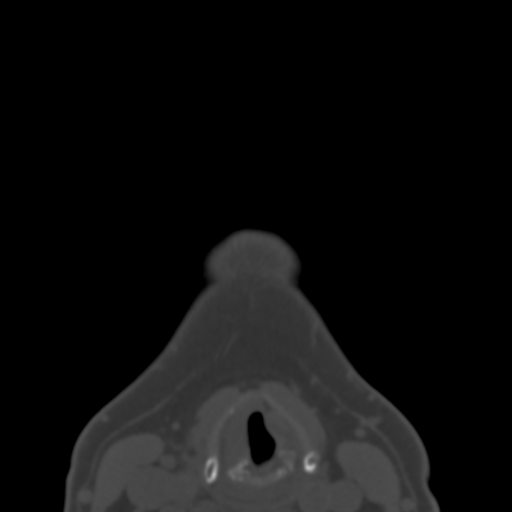
[im 17/89  brain]
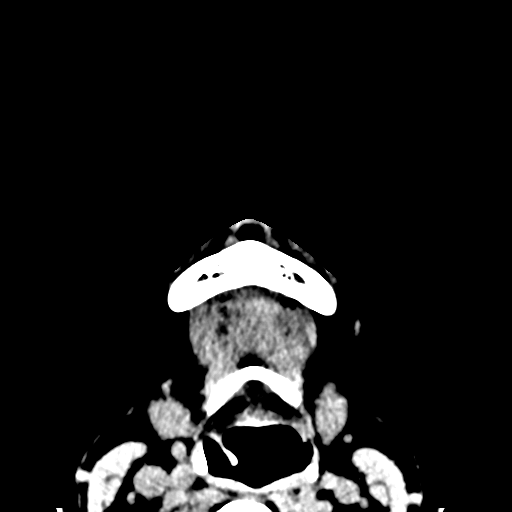
[im 26/89  brain]
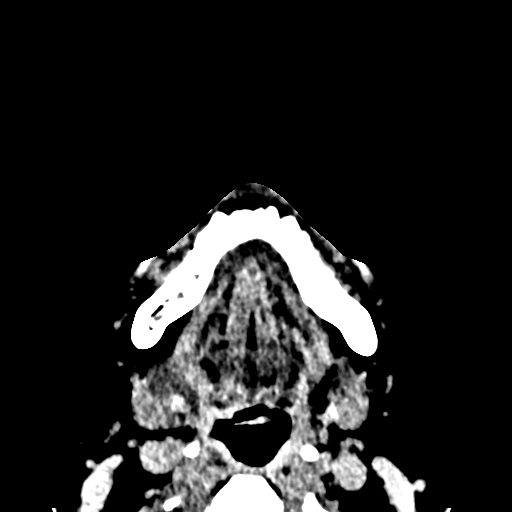
[im 34/89  brain]
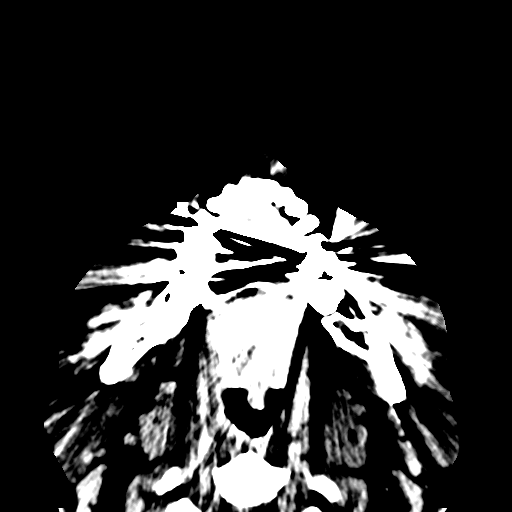
[im 42/89  brain]
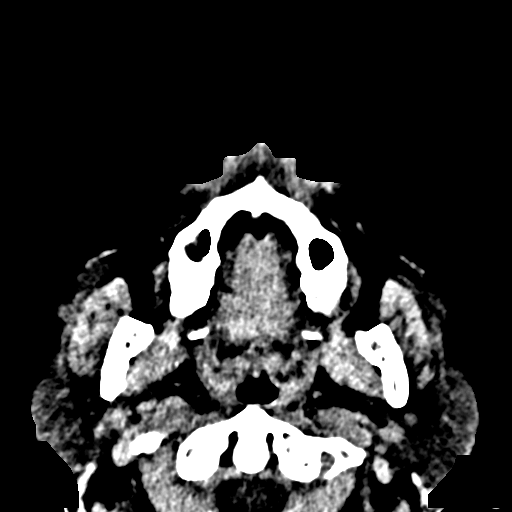
[im 42/89  bone]
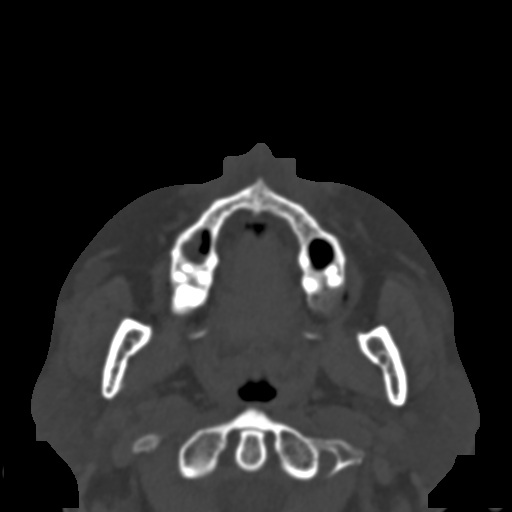
[im 51/89  brain]
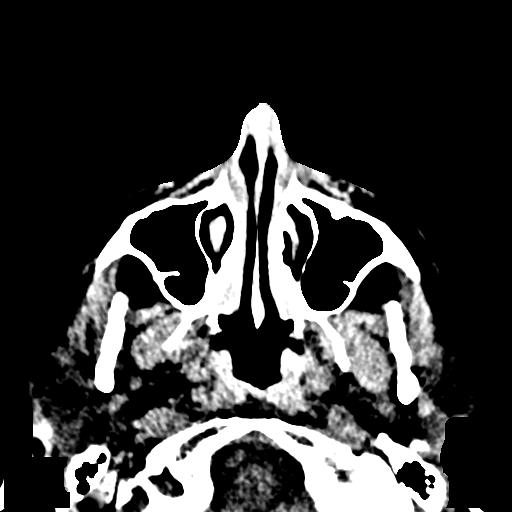
[im 59/89  brain]
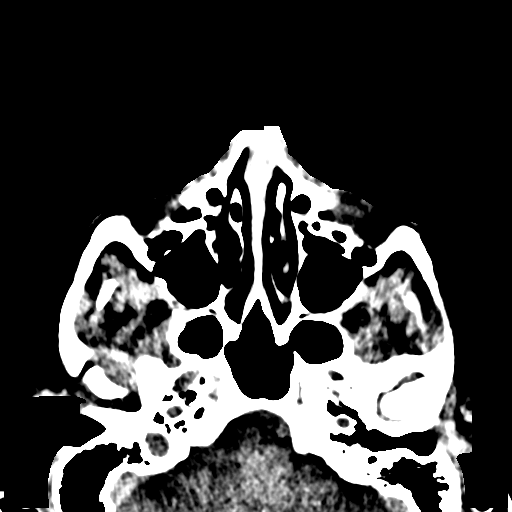
[im 68/89  brain]
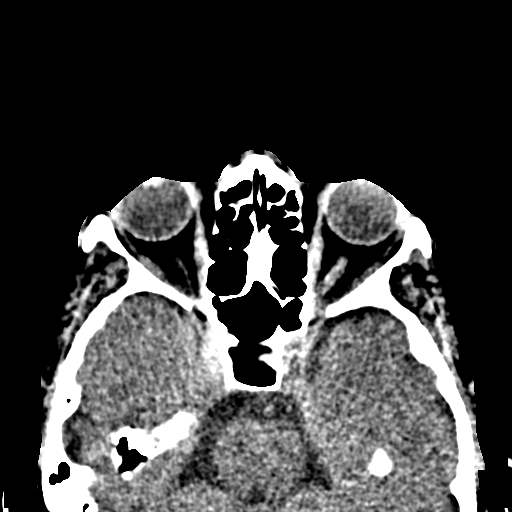
[im 76/89  brain]
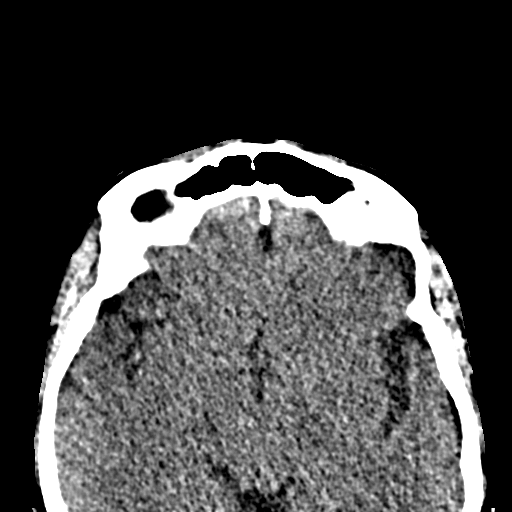
[im 76/89  bone]
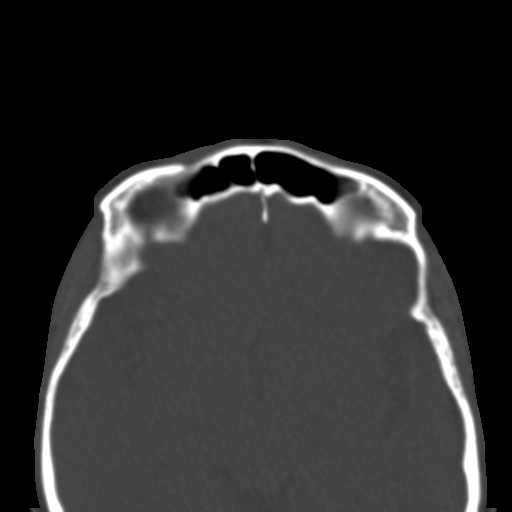
[im 84/89  brain]
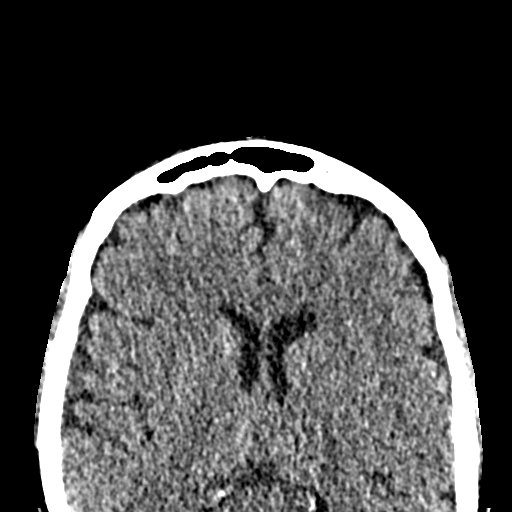

[Series 9: sagittal soft · sagittal · 0.32mm/px · 2 of 85 slices shown]
[im 29/85  brain]
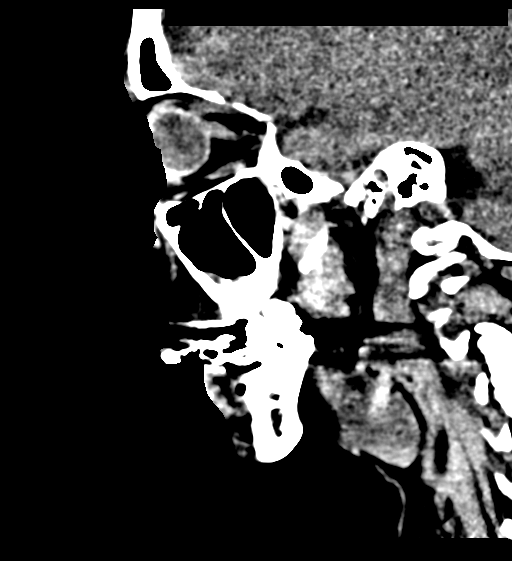
[im 57/85  brain]
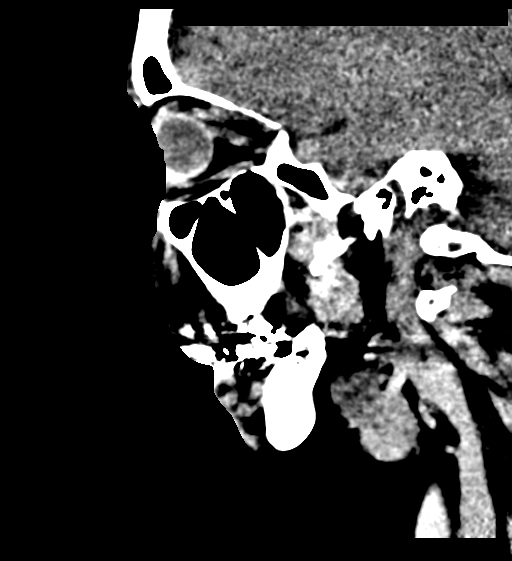

[15 of 47 positions shown; findings below may reference images not displayed]

FINDINGS: CT HEAD FINDINGS

There is no evidence of acute intracranial abnormality including
hemorrhage, infarct, mass lesion, mass effect, midline shift or
abnormal extra-axial fluid collection. No hydrocephalus or
pneumocephalus. The calvarium is intact.

CT MAXILLOFACIAL FINDINGS

The patient has acute bilateral nasal bone fractures. There is mild
compression on the left. No other facial bone fracture is
identified. The mandibular condyles are located. Imaged upper
cervical spine appears normal. The globes are intact and lenses are
located. Orbital fat is clear. There appears to be soft tissue
contusion about the nose and below the eyes. Mild mucosal thickening
is seen the floor of the right maxillary sinus. Imaged paranasal
sinuses and mastoid air cells are otherwise clear.
IMPRESSION: Nondisplaced right and mildly impacted left nasal bone fractures. No
other acute abnormality head or face.

## 2018-01-26 ENCOUNTER — Ambulatory Visit (INDEPENDENT_AMBULATORY_CARE_PROVIDER_SITE_OTHER): Payer: 59

## 2018-01-26 ENCOUNTER — Other Ambulatory Visit: Payer: Self-pay

## 2018-01-26 ENCOUNTER — Ambulatory Visit (INDEPENDENT_AMBULATORY_CARE_PROVIDER_SITE_OTHER): Payer: 59 | Admitting: Family Medicine

## 2018-01-26 ENCOUNTER — Encounter: Payer: Self-pay | Admitting: Family Medicine

## 2018-01-26 VITALS — BP 129/88 | HR 98 | Temp 99.6°F | Resp 16 | Ht 66.5 in | Wt 201.0 lb

## 2018-01-26 DIAGNOSIS — Y92009 Unspecified place in unspecified non-institutional (private) residence as the place of occurrence of the external cause: Secondary | ICD-10-CM

## 2018-01-26 DIAGNOSIS — M25511 Pain in right shoulder: Secondary | ICD-10-CM | POA: Diagnosis not present

## 2018-01-26 DIAGNOSIS — W19XXXA Unspecified fall, initial encounter: Secondary | ICD-10-CM

## 2018-01-26 DIAGNOSIS — Z5181 Encounter for therapeutic drug level monitoring: Secondary | ICD-10-CM

## 2018-01-26 DIAGNOSIS — Z79899 Other long term (current) drug therapy: Secondary | ICD-10-CM | POA: Insufficient documentation

## 2018-01-26 DIAGNOSIS — F1020 Alcohol dependence, uncomplicated: Secondary | ICD-10-CM

## 2018-01-26 MED FILL — traZODone HCL 100 MG TABS: 100 | 30 days supply | Qty: 30 | Fill #1

## 2018-01-26 NOTE — Patient Instructions (Addendum)
  CLINICAL DATA:  Right shoulder pain following fall, initial encounter  EXAM: RIGHT SHOULDER - 2+ VIEW  COMPARISON:  06/01/2016  FINDINGS: Mild degenerative changes of the acromioclavicular joint are seen. There is a well-circumscribed lucency identified within the proximal humerus in the region of the surgical neck which may be related to prior surgery given the patient's given clinical history no acute fracture or dislocation is noted. The underlying bony thorax is within normal limits.  IMPRESSION: No acute abnormality is identified. A lucency is seen in the proximal right humerus which is likely postsurgical in nature. This was visualized on the prior chest x-ray  Degenerative changes without acute abnormality.   Electronically Signed   By: Inez Catalina M.D.   On: 01/26/2018 17:02   IF you received an x-ray today, you will receive an invoice from [facility]. Please contact [facility] at [phone] with questions or concerns regarding your invoice.   IF you received labwork today, you will receive an invoice from [facility]. Please contact [facility] at [phone] with questions or concerns regarding your invoice.   Our billing staff will not be able to assist you with questions regarding bills from these companies.  You will be contacted with the lab results as soon as they are available. The fastest way to get your results is to activate your My Chart account. Instructions are located on the last page of this paperwork. If you have not heard from [place] regarding the results in 2 weeks, please contact this office.

## 2018-01-26 NOTE — Progress Notes (Signed)
Chief Complaint  Patient presents with  . right arm pain from fall on 01/18/18    pain level 4/10 with movement and  pain radiates down into right index finger, ibuprofen and muscle relaxer for pain and helps with sleep.  Pt has hx of rotator cuff surgery.    HPI  Hypertension  Pt was instructed to follow up for nurse visit for BP check and to stop HCTZ  She is currently off the HCTZ since her bp is still good   BP Readings from Last 3 Encounters:  03/01/18 111/75  01/26/18 129/88  12/21/17 104/74    Fall at home Right shoulder pain Pt reports that she fell on 01/18/2018 when getting out of bed She states that she fell out of bed She was sleeping and got up to go to the bathroom She reports that her right shoulder hurts and this is the same side where she had rotator cuff surgery in the past She is right handed She has soreness in the joint of the shoulder but is able to carry her purse and type but did not want to travel without evaluation  She is going to [place] for a conference for work   Alcohol use She reports that she had a few drinks around the time of the injury but does not remember if she drank that night Her last drink was 1.5 weeks ago  She thinks her fall was due to [place].  She stopped the [place]  She states that she is still abstaining from alcohol She states that she took a hydrocodone while on the naltrexone and had symptoms like her throat was closing  She stats that she has gone back to drinking She states that she gets a drink about a week and a half since drinking.  She reports that she is now on clonazepam and is off the celexa She states that she goes to the pain mgmt clinic  She does not have a sponsor because she asked a few women and they report that they already had too many people and she "did not do well with the rejection".   Past Medical History:  Diagnosis Date  . Anxiety   . Arthritis   . Congenital patella maltracking right knee  .  Depression   . Diverticulitis    perforated; required partial colectomy  . GERD (gastroesophageal reflux disease)    takes Zantac daily  . Perforated diverticulitis   . Peripheral edema    takes HCTZ daily as needed  . Pleural effusion, bilateral 11/28/11  . Tachyarrhythmia    s/p ablation-takes Metoprolol daily    Current Outpatient Medications  Medication Sig Dispense Refill  . Biotin 1000 MCG tablet Take 1,000 mcg by mouth 3 (three) times daily.    . clonazePAM (KLONOPIN) 0.5 MG tablet Take 0.5 mg by mouth daily.     Marland Kitchen HYDROcodone-acetaminophen (NORCO/VICODIN) 5-325 MG tablet Take 1 tablet by mouth every 6 (six) hours as needed for moderate pain.    Marland Kitchen ibuprofen (ADVIL,MOTRIN) 600 MG tablet Take 1 tablet (600 mg total) by mouth every 6 (six) hours as needed. 30 tablet 0  . LUTEIN PO Take by mouth.    . Multiple Vitamin (MULTIVITAMIN) tablet Take 1 tablet by mouth every evening.     . raloxifene (EVISTA) 60 MG tablet Take 60 mg by mouth daily.    . ranitidine (ZANTAC) 150 MG tablet Take 150 mg by mouth every evening.    . metoprolol succinate (TOPROL-XL) 25  MG 24 hr tablet Take 1 tablet (25 mg total) by mouth daily. 90 tablet 1   No current facility-administered medications for this visit.     Allergies:  Allergies  Allergen Reactions  . Naltrexone   . No Known Allergies     Past Surgical History:  Procedure Laterality Date  . CARDIAC ELECTROPHYSIOLOGY STUDY AND ABLATION  2006   tachycardia  . CLOSED REDUCTION NASAL FRACTURE N/A 05/20/2016   Procedure: CLOSED REDUCTION NASAL FRACTURE AND REPAIR OF NOSE AND LIP LACERATION;  Surgeon: Loel Lofty Dillingham, DO;  Location: [facility];  Service: Plastics;  Laterality: N/A;  . COLON SURGERY    . COSMETIC SURGERY  05/2016  . [place]  . EXPLORATORY LAPAROTOMY  GYN fertility 1990's  . EYE SURGERY    . GANGLION CYST EXCISION Left 1997  . HERNIA REPAIR  1988  . KNEE SURGERY Right 2012   lateral release  . LASIK     . left small fingernail removed  2006  . PARTIAL COLECTOMY  01/28/12  . PILONIDAL CYST EXCISION  1983  . RHINOPLASTY    . SHOULDER ARTHROSCOPY W/ ROTATOR CUFF REPAIR Right 2010 repair bicep    Social History   Socioeconomic History  . Marital status: Married    Spouse name: Not on file  . Number of children: Not on file  . Years of education: Not on file  . Highest education level: Not on file  Occupational History  . Not on file  Social Needs  . Financial resource strain: Not on file  . Food insecurity:    Worry: Not on file    Inability: Not on file  . Transportation needs:    Medical: Not on file    Non-medical: Not on file  Tobacco Use  . Smoking status: Former Smoker    Packs/day: 0.50    Years: 32.00    Pack years: 16.00    Types: Cigarettes  . Smokeless tobacco: Never Used  . Tobacco comment: hasn't smoked since 05/10/16  Substance and Sexual Activity  . Alcohol use: Yes    Alcohol/week: 1.2 oz    Types: 1 Glasses of wine, 1 Cans of beer per week    Comment: 1 pint vodka a day  . Drug use: No  . Sexual activity: Not on file  Lifestyle  . Physical activity:    Days per week: Not on file    Minutes per session: Not on file  . Stress: Not on file  Relationships  . Social connections:    Talks on phone: Not on file    Gets together: Not on file    Attends religious service: Not on file    Active member of club or organization: Not on file    Attends meetings of clubs or organizations: Not on file    Relationship status: Not on file  Other Topics Concern  . Not on file  Social History Narrative  . Not on file    Family History  Problem Relation Age of Onset  . Heart failure Mother   . Stroke Mother   . Cancer Father 42       brain tumor  . Stomach cancer Paternal Uncle   . Colon cancer Paternal Uncle   . Stomach cancer Cousin   . Esophageal cancer Neg Hx   . Rectal cancer Neg Hx      ROS Review of Systems See HPI Constitution: No fevers or  chills No malaise  No diaphoresis Skin: No rash or itching Eyes: no blurry vision, no double vision GU: no dysuria or hematuria Neuro: no dizziness or headaches all others reviewed and negative   Objective: Vitals:   01/26/18 1615  BP: 129/88  Pulse: 98  Resp: 16  Temp: 99.6 F (37.6 C)  TempSrc: Oral  SpO2: 96%  Weight: 201 lb (91.2 kg)  Height: 5' 6.5" (1.689 m)    Physical Exam  Constitutional: She is oriented to person, place, and time. She appears well-developed and well-nourished.  HENT:  Head: Normocephalic and atraumatic.  Eyes: Conjunctivae and EOM are normal.  Cardiovascular: Normal rate, regular rhythm and normal heart sounds.  Pulmonary/Chest: Effort normal and breath sounds normal. No stridor. No respiratory distress. She has no wheezes. She has no rales.  Musculoskeletal:       Right shoulder: She exhibits tenderness and bony tenderness. She exhibits normal range of motion, no swelling, no effusion, no crepitus, no deformity, no laceration, no pain, no spasm, normal pulse and normal strength.  Neurological: She is alert and oriented to person, place, and time.  Skin: Skin is warm. Capillary refill takes less than 2 seconds. No erythema.  Psychiatric: She has a normal mood and affect. Her behavior is normal. Judgment and thought content normal.   RIGHT SHOULDER - 2+ VIEW  COMPARISON:  06/01/2016  FINDINGS: Mild degenerative changes of the acromioclavicular joint are seen. There is a well-circumscribed lucency identified within the proximal humerus in the region of the surgical neck which may be related to prior surgery given the patient's given clinical history no acute fracture or dislocation is noted. The underlying bony thorax is within normal limits.  IMPRESSION: No acute abnormality is identified. A lucency is seen in the proximal right humerus which is likely postsurgical in nature. This was visualized on the prior chest x-ray  Degenerative  changes without acute abnormality.   Electronically Signed   By: Inez Catalina M.D.   On: 01/26/2018 17:02   Assessment and Plan Robie was seen today for right arm pain from fall on 01/18/18.  Diagnoses and all orders for this visit:  Alcoholism ([place])- waxes and wanes afterwork evens are a trigger for her Discussed AA and pat does not like the experience of the group She will continue one-on-one couseling  Polypharmacy- she is getting off the multiple psych meds due to lethargy  Encounter for medication monitoring  Fall in home, initial encounter- pt states that it was not alcohol related She denies any history of abuse -     DG Shoulder Right; Future  Acute pain of right shoulder-  Mild degenerative change, advised topical rub and ice pad -     DG Shoulder Right; Future  A total of 50 minutes were spent face-to-face with the patient during this encounter and over half of that time was spent on counseling and coordination of care.    [place]

## 2018-01-27 MED FILL — HYDROCODON-APAP 5-325: 5-325 | 30 days supply | Qty: 60 | Fill #0

## 2018-02-08 DIAGNOSIS — H5203 Hypermetropia, bilateral: Secondary | ICD-10-CM | POA: Diagnosis not present

## 2018-02-08 DIAGNOSIS — H52223 Regular astigmatism, bilateral: Secondary | ICD-10-CM | POA: Diagnosis not present

## 2018-02-08 DIAGNOSIS — H524 Presbyopia: Secondary | ICD-10-CM | POA: Diagnosis not present

## 2018-02-20 DIAGNOSIS — F1020 Alcohol dependence, uncomplicated: Secondary | ICD-10-CM | POA: Diagnosis not present

## 2018-02-21 MED FILL — RALOXIFENE HCL 60 MG TABLET: 60 | 90 days supply | Qty: 90 | Fill #1

## 2018-02-21 MED FILL — XIIDRA 5% EYE DROPS: 5 | 30 days supply | Qty: 60 | Fill #2

## 2018-02-24 MED FILL — HYDROCODON-APAP 5-325: 5-325 | 30 days supply | Qty: 60 | Fill #0

## 2018-03-01 ENCOUNTER — Ambulatory Visit: Payer: 59 | Admitting: Family Medicine

## 2018-03-01 ENCOUNTER — Encounter: Payer: Self-pay | Admitting: Family Medicine

## 2018-03-01 ENCOUNTER — Other Ambulatory Visit: Payer: Self-pay

## 2018-03-01 VITALS — BP 111/75 | HR 85 | Temp 98.0°F | Resp 16 | Ht 66.54 in | Wt 195.0 lb

## 2018-03-01 DIAGNOSIS — F1020 Alcohol dependence, uncomplicated: Secondary | ICD-10-CM | POA: Diagnosis not present

## 2018-03-01 DIAGNOSIS — E6609 Other obesity due to excess calories: Secondary | ICD-10-CM | POA: Diagnosis not present

## 2018-03-01 DIAGNOSIS — R7989 Other specified abnormal findings of blood chemistry: Secondary | ICD-10-CM

## 2018-03-01 DIAGNOSIS — Z6830 Body mass index (BMI) 30.0-30.9, adult: Secondary | ICD-10-CM

## 2018-03-01 DIAGNOSIS — R945 Abnormal results of liver function studies: Secondary | ICD-10-CM

## 2018-03-01 DIAGNOSIS — I10 Essential (primary) hypertension: Secondary | ICD-10-CM

## 2018-03-01 MED ORDER — METOPROLOL SUCCINATE ER 25 MG PO TB24: 25.0000 mg | ORAL_TABLET | Freq: Every day | ORAL | 1 refills | Status: DC

## 2018-03-01 MED FILL — METOPROLOL SUCCINATE ER 25: 25 | 90 days supply | Qty: 90 | Fill #0

## 2018-03-01 NOTE — Progress Notes (Signed)
Chief Complaint  Patient presents with  . Chronic Conditions    3 month follow-up     HPI Obesity She reports that she had a relapse with her drinking but she has cut back due to the calories She reports that she had 2 beers the other night Wt Readings from Last 3 Encounters:  03/01/18 195 lb (88.5 kg)  01/26/18 201 lb (91.2 kg)  11/23/17 207 lb 12.8 oz (94.3 kg)  Body mass index is 30.97 kg/m.  She is exercising by walking and swimming   Alcoholism Depression screen [facility] 2/9 03/01/2018 01/26/2018 11/02/2017 10/19/2017 07/13/2017  Decreased Interest 0 0 0 0 0  Down, Depressed, Hopeless 0 0 0 0 0  PHQ - 2 Score 0 0 0 0 0  Altered sleeping - - - - -  Tired, decreased energy - - - - -  Change in appetite - - - - -  Feeling bad or failure about yourself  - - - - -  Trouble concentrating - - - - -  Moving slowly or fidgety/restless - - - - -  Suicidal thoughts - - - - -  PHQ-9 Score - - - - -  Difficult doing work/chores - - - - -  She reports that she had a relapse with her drinking but she has cut back due to the calories She reports that her mood is pretty good She states that the exercise is helping her mood She is now off the citalopram and is no longer going to the alcohol treatment program or AA She is no longer taken trazodone which was not helping She stays sober by taking busy and avoiding triggers    Past Medical History:  Diagnosis Date  . Anxiety   . Arthritis   . Congenital patella maltracking right knee  . Depression   . Diverticulitis    perforated; required partial colectomy  . GERD (gastroesophageal reflux disease)    takes Zantac daily  . Perforated diverticulitis   . Peripheral edema    takes HCTZ daily as needed  . Pleural effusion, bilateral 11/28/11  . Tachyarrhythmia    s/p ablation-takes Metoprolol daily    Current Outpatient Medications  Medication Sig Dispense Refill  . Biotin 1000 MCG tablet Take 1,000 mcg by mouth 3 (three) times  daily.    . clonazePAM (KLONOPIN) 0.5 MG tablet Take 0.5 mg by mouth daily.     Marland Kitchen HYDROcodone-acetaminophen (NORCO/VICODIN) 5-325 MG tablet Take 1 tablet by mouth every 6 (six) hours as needed for moderate pain.    Marland Kitchen ibuprofen (ADVIL,MOTRIN) 600 MG tablet Take 1 tablet (600 mg total) by mouth every 6 (six) hours as needed. 30 tablet 0  . LUTEIN PO Take by mouth.    . metoprolol succinate (TOPROL-XL) 25 MG 24 hr tablet Take 1 tablet (25 mg total) by mouth daily. 90 tablet 1  . Multiple Vitamin (MULTIVITAMIN) tablet Take 1 tablet by mouth every evening.     . raloxifene (EVISTA) 60 MG tablet Take 60 mg by mouth daily.    . ranitidine (ZANTAC) 150 MG tablet Take 150 mg by mouth every evening.     No current facility-administered medications for this visit.     Allergies:  Allergies  Allergen Reactions  . Naltrexone   . No Known Allergies     Past Surgical History:  Procedure Laterality Date  . CARDIAC ELECTROPHYSIOLOGY STUDY AND ABLATION  2006   tachycardia  . CLOSED REDUCTION NASAL FRACTURE N/A 05/20/2016  Procedure: CLOSED REDUCTION NASAL FRACTURE AND REPAIR OF NOSE AND LIP LACERATION;  Surgeon: Loel Lofty Dillingham, DO;  Location: [place];  Service: Plastics;  Laterality: N/A;  . COLON SURGERY    . COSMETIC SURGERY  05/2016  . [place]  . EXPLORATORY LAPAROTOMY  GYN fertility 1990's  . EYE SURGERY    . GANGLION CYST EXCISION Left 1997  . HERNIA REPAIR  1988  . KNEE SURGERY Right 2012   lateral release  . LASIK    . left small fingernail removed  2006  . PARTIAL COLECTOMY  01/28/12  . PILONIDAL CYST EXCISION  1983  . RHINOPLASTY    . SHOULDER ARTHROSCOPY W/ ROTATOR CUFF REPAIR Right 2010 repair bicep    Social History   Socioeconomic History  . Marital status: Married    Spouse name: Not on file  . Number of children: Not on file  . Years of education: Not on file  . Highest education level: Not on file  Occupational History  . Not on file  Social  Needs  . Financial resource strain: Not on file  . Food insecurity:    Worry: Not on file    Inability: Not on file  . Transportation needs:    Medical: Not on file    Non-medical: Not on file  Tobacco Use  . Smoking status: Former Smoker    Packs/day: 0.50    Years: 32.00    Pack years: 16.00    Types: Cigarettes  . Smokeless tobacco: Never Used  . Tobacco comment: hasn't smoked since 05/10/16  Substance and Sexual Activity  . Alcohol use: Yes    Alcohol/week: 1.2 oz    Types: 1 Glasses of wine, 1 Cans of beer per week    Comment: 1 pint vodka a day  . Drug use: No  . Sexual activity: Not on file  Lifestyle  . Physical activity:    Days per week: Not on file    Minutes per session: Not on file  . Stress: Not on file  Relationships  . Social connections:    Talks on phone: Not on file    Gets together: Not on file    Attends religious service: Not on file    Active member of club or organization: Not on file    Attends meetings of clubs or organizations: Not on file    Relationship status: Not on file  Other Topics Concern  . Not on file  Social History Narrative  . Not on file    Family History  Problem Relation Age of Onset  . Heart failure Mother   . Stroke Mother   . Cancer Father 25       brain tumor  . Stomach cancer Paternal Uncle   . Colon cancer Paternal Uncle   . Stomach cancer Cousin   . Esophageal cancer Neg Hx   . Rectal cancer Neg Hx      ROS Review of Systems See HPI Constitution: No fevers or chills No malaise No diaphoresis Skin: No rash or itching Eyes: no blurry vision, no double vision GU: no dysuria or hematuria Neuro: no dizziness or headaches all others reviewed and negative   Objective: Vitals:   03/01/18 1114  Pulse: (!) 111  Resp: 16  Temp: 98 F (36.7 C)  TempSrc: Oral  SpO2: 98%  Weight: 195 lb (88.5 kg)  Height: 5' 6.54" (1.69 m)    Physical Exam Physical Exam  Constitutional: She  is oriented to person,  place, and time. She appears well-developed and well-nourished.  HENT:  Head: Normocephalic and atraumatic.  Eyes: Conjunctivae and EOM are normal.  Cardiovascular: Normal rate, regular rhythm and normal heart sounds.   Pulmonary/Chest: Effort normal and breath sounds normal. No respiratory distress. She has no wheezes.  Abdominal: Normal appearance and bowel sounds are normal. There is no tenderness. There is no CVA tenderness. No HSM. No RUQ pain.  Neurological: She is alert and oriented to person, place, and time.    Assessment and Plan Shrita was seen today for chronic conditions.  Diagnoses and all orders for this visit:  Elevated LFTs -     Hepatic Function Panel Will trend since pt has cut back down on her drinking  Alcoholism ([place]) -  Improved with behavioral changes  not interested in AA type program  Using self help and diet  Class 1 obesity due to excess calories without serious comorbidity with body mass index (BMI) of 30.0 to 30.9 in adult -  Improved with weight loss    Courteney Alderete A Dupree Givler

## 2018-03-01 NOTE — Patient Instructions (Signed)
     IF you received an x-ray today, you will receive an invoice from [facility]. Please contact [facility] at [phone] with questions or concerns regarding your invoice.   IF you received labwork today, you will receive an invoice from [facility]. Please contact [facility] at [phone] with questions or concerns regarding your invoice.   Our billing staff will not be able to assist you with questions regarding bills from these companies.  You will be contacted with the lab results as soon as they are available. The fastest way to get your results is to activate your My Chart account. Instructions are located on the last page of this paperwork. If you have not heard from us regarding the results in 2 weeks, please contact this office.     

## 2018-03-02 LAB — HEPATIC FUNCTION PANEL
ALBUMIN: 4.9 g/dL (ref 3.5–5.5)
ALT: 39 IU/L — ABNORMAL HIGH (ref 0–32)
AST: 46 IU/L — ABNORMAL HIGH (ref 0–40)
Alkaline Phosphatase: 133 IU/L — ABNORMAL HIGH (ref 39–117)
Bilirubin Total: 0.5 mg/dL (ref 0.0–1.2)
Bilirubin, Direct: 0.19 mg/dL (ref 0.00–0.40)
TOTAL PROTEIN: 7.8 g/dL (ref 6.0–8.5)

## 2018-03-13 DIAGNOSIS — H16223 Keratoconjunctivitis sicca, not specified as Sjogren's, bilateral: Secondary | ICD-10-CM | POA: Diagnosis not present

## 2018-03-13 DIAGNOSIS — H10413 Chronic giant papillary conjunctivitis, bilateral: Secondary | ICD-10-CM | POA: Diagnosis not present

## 2018-03-13 DIAGNOSIS — H04123 Dry eye syndrome of bilateral lacrimal glands: Secondary | ICD-10-CM | POA: Diagnosis not present

## 2018-03-23 MED FILL — PHENTERMINE 37.5 MG TABLET: 37.5 | 30 days supply | Qty: 30 | Fill #1

## 2018-03-23 MED FILL — XIIDRA 5% EYE DROPS: 5 | 30 days supply | Qty: 60 | Fill #3

## 2018-03-24 MED FILL — HYDROCODON-APAP 5-325: 5-325 | 30 days supply | Qty: 60 | Fill #0

## 2018-03-29 ENCOUNTER — Ambulatory Visit: Payer: 59 | Admitting: Family Medicine

## 2018-04-19 DIAGNOSIS — Z79891 Long term (current) use of opiate analgesic: Secondary | ICD-10-CM | POA: Diagnosis not present

## 2018-04-19 DIAGNOSIS — M25561 Pain in right knee: Secondary | ICD-10-CM | POA: Diagnosis not present

## 2018-04-21 MED FILL — HYDROCODON-APAP 5-325: 5-325 | 30 days supply | Qty: 90 | Fill #0

## 2018-05-17 MED FILL — RALOXIFENE HCL 60 MG TABS: 60 | 90 days supply | Qty: 90 | Fill #2

## 2018-05-19 MED FILL — HYDROCODON-APAP 5-325: 5-325 | 30 days supply | Qty: 90 | Fill #0

## 2018-05-31 ENCOUNTER — Ambulatory Visit: Payer: 59 | Admitting: Family Medicine

## 2018-06-01 ENCOUNTER — Ambulatory Visit: Payer: 59 | Admitting: Family Medicine

## 2018-06-08 MED FILL — ALPRAZolam 1 MG TABS: 1 | 90 days supply | Qty: 180 | Fill #0

## 2018-06-16 MED FILL — HYDROCODON-APAP 5-325: 5-325 | 30 days supply | Qty: 90 | Fill #0

## 2018-06-27 ENCOUNTER — Ambulatory Visit: Payer: 59 | Admitting: Family Medicine

## 2018-06-27 ENCOUNTER — Other Ambulatory Visit: Payer: Self-pay

## 2018-06-27 ENCOUNTER — Encounter: Payer: Self-pay | Admitting: Family Medicine

## 2018-06-27 VITALS — BP 117/81 | HR 77 | Temp 97.6°F | Resp 17 | Ht 66.54 in | Wt 201.4 lb

## 2018-06-27 DIAGNOSIS — R945 Abnormal results of liver function studies: Secondary | ICD-10-CM | POA: Diagnosis not present

## 2018-06-27 DIAGNOSIS — R000 Tachycardia, unspecified: Secondary | ICD-10-CM | POA: Diagnosis not present

## 2018-06-27 DIAGNOSIS — F1020 Alcohol dependence, uncomplicated: Secondary | ICD-10-CM

## 2018-06-27 DIAGNOSIS — R7989 Other specified abnormal findings of blood chemistry: Secondary | ICD-10-CM

## 2018-06-27 NOTE — Patient Instructions (Addendum)
Contact: Buena Irish, Arboriculturist at [facility] [address] . [place], [place]: [phone] . Fax: [phone]  Primary Care, Behavioral Medicine     If you have lab work done today you will be contacted with your lab results within the next 2 weeks.  If you have not heard from [place] then please contact us. The fastest way to get your results is to register for My Chart.   IF you received an x-ray today, you will receive an invoice from [facility]. Please contact [facility] at [phone] with questions or concerns regarding your invoice.   IF you received labwork today, you will receive an invoice from [place]. Please contact [facility] at [phone] with questions or concerns regarding your invoice.   Our billing staff will not be able to assist you with questions regarding bills from these companies.  You will be contacted with the lab results as soon as they are available. The fastest way to get your results is to activate your My Chart account. Instructions are located on the last page of this paperwork. If you have not heard from [place] regarding the results in 2 weeks, please contact this office.     Alcohol Use Disorder Alcohol use disorder is when your drinking disrupts your daily life. When you have this condition, you drink too much alcohol and you cannot control your drinking. Alcohol use disorder can cause serious problems with your physical health. It can affect your brain, heart, liver, pancreas, immune system, stomach, and intestines. Alcohol use disorder can increase your risk for certain cancers and cause problems with your mental health, such as depression, anxiety, psychosis, delirium, and dementia. People with this disorder risk hurting themselves and others. What are the causes? This condition is caused by drinking too much alcohol over time. It is not caused by drinking too much alcohol only  one or two times. Some people with this condition drink alcohol to cope with or escape from negative life events. Others drink to relieve pain or symptoms of mental illness. What increases the risk? You are more likely to develop this condition if:  You have a family history of alcohol use disorder.  Your culture encourages drinking to the point of intoxication, or makes alcohol easy to get.  You had a mood or conduct disorder in childhood.  You have been a victim of abuse.  You are an adolescent and: ? You have poor grades or difficulties in school. ? Your caregivers do not talk to you about saying no to alcohol, or supervise your activities. ? You are impulsive or you have trouble with self-control.  What are the signs or symptoms? Symptoms of this condition include:  Drinkingmore than you want to.  Drinking for longer than you want to.  Trying several times to drink less or to control your drinking.  Spending a lot of time getting alcohol, drinking, or recovering from drinking.  Craving alcohol.  Having problems at work, at school, or at home due to drinking.  Having problems in relationships due to drinking.  Drinking when it is dangerous to drink, such as before driving a car.  Continuing to drink even though you know you might have a physical or mental problem related to drinking.  Needing more and more alcohol to get the same effect you want from the alcohol (building up tolerance).  Having symptoms of withdrawal when you stop drinking. Symptoms of withdrawal include: ? Fatigue. ?  Nightmares. ? Trouble sleeping. ? Depression. ? Anxiety. ? Fever. ? Seizures. ? Severe confusion. ? Feeling or seeing things that are not there (hallucinations). ? Tremors. ? Rapid heart rate. ? Rapid breathing. ? High blood pressure.  Drinking to avoid symptoms of withdrawal.  How is this diagnosed? This condition is diagnosed with an assessment. Your health care provider  may start the assessment by asking three or four questions about your drinking. Your health care provider may perform a physical exam or do lab tests to see if you have physical problems resulting from alcohol use. She or he may refer you to a mental health professional for evaluation. How is this treated? Some people with alcohol use disorder are able to reduce their alcohol use to low-risk levels. Others need to completely quit drinking alcohol. When necessary, mental health professionals with specialized training in substance use treatment can help. Your health care provider can help you decide how severe your alcohol use disorder is and what type of treatment you need. The following forms of treatment are available:  Detoxification. Detoxification involves quitting drinking and using prescription medicines within the first week to help lessen withdrawal symptoms. This treatment is important for people who have had withdrawal symptoms before and for heavy drinkers who are likely to have withdrawal symptoms. Alcohol withdrawal can be dangerous, and in severe cases, it can cause death. Detoxification may be provided in a home, community, or primary care setting, or in a hospital or substance use treatment facility.  Counseling. This treatment is also called talk therapy. It is provided by substance use treatment counselors. A counselor can address the reasons you use alcohol and suggest ways to keep you from drinking again or to prevent problem drinking. The goals of talk therapy are to: ? Find healthy activities and ways for you to cope with stress. ? Identify and avoid the things that trigger your alcohol use. ? Help you learn how to handle cravings.  Medicines.Medicines can help treat alcohol use disorder by: ? Decreasing alcohol cravings. ? Decreasing the positive feeling you have when you drink alcohol. ? Causing an uncomfortable physical reaction when you drink alcohol (aversion  therapy).  Support groups. Support groups are led by people who have quit drinking. They provide emotional support, advice, and guidance.  These forms of treatment are often combined. Some people with this condition benefit from a combination of treatments provided by specialized substance use treatment centers. Follow these instructions at home:  Take over-the-counter and prescription medicines only as told by your health care provider.  Check with your health care provider before starting any new medicines.  Ask friends and family members not to offer you alcohol.  Avoid situations where alcohol is served, including gatherings where others are drinking alcohol.  Create a plan for what to do when you are tempted to use alcohol.  Find hobbies or activities that you enjoy that do not include alcohol.  Keep all follow-up visits as told by your health care provider. This is important. How is this prevented?  If you drink, limit alcohol intake to no more than 1 drink a day for nonpregnant women and 2 drinks a day for men. One drink equals 12 oz of beer, 5 oz of wine, or 1 oz of hard liquor.  If you have a mental health condition, get treatment and support.  Do not give alcohol to adolescents.  If you are an adolescent: ? Do not drink alcohol. ? Do not be afraid to  say no if someone offers you alcohol. Speak up about why you do not want to drink. You can be a positive role model for your friends and set a good example for those around you by not drinking alcohol. ? If your friends drink, spend time with others who do not drink alcohol. Make new friends who do not use alcohol. ? Find healthy ways to manage stress and emotions, such as meditation or deep breathing, exercise, spending time in nature, listening to music, or talking with a trusted friend or family member. Contact a health care provider if:  You are not able to take your medicines as told.  Your symptoms get worse.  You  return to drinking alcohol (relapse) and your symptoms get worse. Get help right away if:  You have thoughts about hurting yourself or others. If you ever feel like you may hurt yourself or others, or have thoughts about taking your own life, get help right away. You can go to your nearest emergency department or call:  Your local emergency services (911 in the [place]  A suicide crisis helpline, such as the [place] at 613-184-7467. This is open 24 hours a day.  Summary  Alcohol use disorder is when your drinking disrupts your daily life. When you have this condition, you drink too much alcohol and you cannot control your drinking.  Treatment may include detoxification, counseling, medicine, and support groups.  Ask friends and family members not to offer you alcohol. Avoid situations where alcohol is served.  Get help right away if you have thoughts about hurting yourself or others. This information is not intended to replace advice given to you by your health care provider. Make sure you discuss any questions you have with your health care provider. Document Released: 10/07/2004 Document Revised: 05/27/2016 Document Reviewed: 05/27/2016 Elsevier Interactive Patient Education  [employer].

## 2018-06-27 NOTE — Progress Notes (Signed)
Chief Complaint  Patient presents with  . elevated LFT's    3 month f/u  . 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 She started September but did not do well She states that she is in a online support system through [employer] She reports that she gets support from her online tribe She is not doing any local counseling She is doing some books- alcohol lied to me and Amanda [name] She is trying to be more mindful   Depression screen [facility] 2/9 06/27/2018 03/01/2018 01/26/2018 11/02/2017 10/19/2017  Decreased Interest 0 0 0 0 0  Down, Depressed, Hopeless 0 0 0 0 0  PHQ - 2 Score 0 0 0 0 0   She denies any depression She states that her partner has cirrhosis of the liver She is a caregiver as well    Past Medical History:  Diagnosis Date  . Anxiety   . Arthritis   . Congenital patella maltracking right knee  . Depression   . Diverticulitis    perforated; required partial colectomy  . GERD (gastroesophageal reflux disease)    takes Zantac daily  . Perforated diverticulitis   . Peripheral edema    takes HCTZ daily as needed  . Pleural effusion, bilateral 11/28/11  . Tachyarrhythmia    s/p ablation-takes Metoprolol daily    Current Outpatient Medications  Medication Sig Dispense Refill  . Biotin 1000 MCG tablet Take 1,000 mcg by mouth 3 (three) times daily.    . clonazePAM (KLONOPIN) 0.5 MG tablet Take 0.5 mg by mouth daily.     Marland Kitchen HYDROcodone-acetaminophen (NORCO/VICODIN) 5-325 MG tablet Take 1 tablet by mouth every 6 (six) hours as needed for moderate  pain.    Marland Kitchen ibuprofen (ADVIL,MOTRIN) 600 MG tablet Take 1 tablet (600 mg total) by mouth every 6 (six) hours as needed. 30 tablet 0  . LUTEIN PO Take by mouth.    . metoprolol succinate (TOPROL-XL) 25 MG 24 hr tablet Take 1 tablet (25 mg total) by mouth daily. 90 tablet 1  . Multiple Vitamin (MULTIVITAMIN) tablet Take 1 tablet by mouth every evening.     . raloxifene (EVISTA) 60 MG tablet Take 60 mg by mouth daily.    . ranitidine (ZANTAC) 150 MG tablet Take 150 mg by mouth every evening.     No current facility-administered medications for this visit.     Allergies:  Allergies  Allergen Reactions  . Naltrexone   . No Known Allergies     Past Surgical History:  Procedure Laterality Date  . CARDIAC ELECTROPHYSIOLOGY STUDY AND ABLATION  2006   tachycardia  . CLOSED REDUCTION NASAL FRACTURE N/A 05/20/2016   Procedure: CLOSED REDUCTION NASAL FRACTURE AND REPAIR OF NOSE AND LIP LACERATION;  Surgeon: Loel Lofty Dillingham, DO;  Location: [facility];  Service: Plastics;  Laterality: N/A;  . COLON SURGERY    . COSMETIC SURGERY  05/2016  . [place]  . EXPLORATORY LAPAROTOMY  GYN fertility 1990's  . EYE SURGERY    .  GANGLION CYST EXCISION Left 1997  . HERNIA REPAIR  1988  . KNEE SURGERY Right 2012   lateral release  . LASIK    . left small fingernail removed  2006  . PARTIAL COLECTOMY  01/28/12  . PILONIDAL CYST EXCISION  1983  . RHINOPLASTY    . SHOULDER ARTHROSCOPY W/ ROTATOR CUFF REPAIR Right 2010 repair bicep    Social History   Socioeconomic History  . Marital status: Married    Spouse name: Not on file  . Number of children: Not on file  . Years of education: Not on file  . Highest education level: Not on file  Occupational History  . Not on file  Social Needs  . Financial resource strain: Not on file  . Food insecurity:    Worry: Not on file    Inability: Not on file  . Transportation needs:    Medical: Not on file    Non-medical: Not on file    Tobacco Use  . Smoking status: Former Smoker    Packs/day: 0.50    Years: 32.00    Pack years: 16.00    Types: Cigarettes  . Smokeless tobacco: Never Used  . Tobacco comment: hasn't smoked since 05/10/16  Substance and Sexual Activity  . Alcohol use: Yes    Alcohol/week: 2.0 standard drinks    Types: 1 Glasses of wine, 1 Cans of beer per week    Comment: 1 pint vodka a day  . Drug use: No  . Sexual activity: Not on file  Lifestyle  . Physical activity:    Days per week: Not on file    Minutes per session: Not on file  . Stress: Not on file  Relationships  . Social connections:    Talks on phone: Not on file    Gets together: Not on file    Attends religious service: Not on file    Active member of club or organization: Not on file    Attends meetings of clubs or organizations: Not on file    Relationship status: Not on file  Other Topics Concern  . Not on file  Social History Narrative  . Not on file    Family History  Problem Relation Age of Onset  . Heart failure Mother   . Stroke Mother   . Cancer Father 81       brain tumor  . Stomach cancer Paternal Uncle   . Colon cancer Paternal Uncle   . Stomach cancer Cousin   . Esophageal cancer Neg Hx   . Rectal cancer Neg Hx      ROS Review of Systems See HPI Constitution: No fevers or chills No malaise No diaphoresis Skin: No rash or itching Eyes: no blurry vision, no double vision GU: no dysuria or hematuria Neuro: no dizziness or headaches  all others reviewed and negative   Objective: Vitals:   06/27/18 0915  BP: 117/81  Pulse: 77  Resp: 17  Temp: 97.6 F (36.4 C)  TempSrc: Oral  SpO2: 97%  Weight: 201 lb 6.4 oz (91.4 kg)  Height: 5' 6.54" (1.69 m)    Physical Exam  Constitutional: She is oriented to person, place, and time. She appears well-developed and well-nourished.  HENT:  Head: Normocephalic and atraumatic.  Eyes: Conjunctivae and EOM are normal.  Cardiovascular: Normal rate,  regular rhythm and normal heart sounds.  No murmur heard. Pulmonary/Chest: Effort normal and breath sounds normal. No stridor. No respiratory distress.  Neurological: She is alert  and oriented to person, place, and time.  Skin: Skin is warm. Capillary refill takes less than 2 seconds.  Psychiatric: She has a normal mood and affect. Her behavior is normal. Judgment and thought content normal.     Component     Latest Ref Rng & Units 01/07/2017 05/23/2017 11/23/2017 03/01/2018  Alkaline Phosphatase     39 - 117 IU/L 81 89 146 (H) 133 (H)  AST     0 - 40 IU/L 98 (H) 53 (H) 49 (H) 46 (H)  ALT     0 - 32 IU/L 63 (H) 31 53 (H) 39 (H)  BILIRUBIN, DIRECT     0.00 - 0.40 mg/dL  0.37  0.19     Assessment and Plan Amanda [name] was seen today for elevated lft's and medication refill.  Diagnoses and all orders for this visit:  Abnormal LFTs- discussed that she should  -     Hepatic Function Panel -     US LIVER DOPPLER; Future -     Hepatitis B surface antigen -     HCV Ab w/Rflx to Verification  Alcoholism [facility])- continue support group Discussed that she should consider local counseling with a LCSW -     Hepatic Function Panel -     US LIVER DOPPLER; Future  Tachyarrhythmia- advised avoidance of phentermine Discontinue immediately  Continue metoprolol     Kamil Mchaffie A Amanda [name]

## 2018-06-28 LAB — HEPATITIS B SURFACE ANTIGEN: HEP B S AG: NEGATIVE

## 2018-06-28 LAB — HEPATIC FUNCTION PANEL
ALBUMIN: 4.5 g/dL (ref 3.5–5.5)
ALT: 113 IU/L — ABNORMAL HIGH (ref 0–32)
AST: 96 IU/L — ABNORMAL HIGH (ref 0–40)
Alkaline Phosphatase: 146 IU/L — ABNORMAL HIGH (ref 39–117)
Bilirubin Total: 0.6 mg/dL (ref 0.0–1.2)
Bilirubin, Direct: 0.26 mg/dL (ref 0.00–0.40)
TOTAL PROTEIN: 7.4 g/dL (ref 6.0–8.5)

## 2018-06-28 LAB — HCV INTERPRETATION

## 2018-06-28 LAB — HCV AB W/RFLX TO VERIFICATION

## 2018-07-05 ENCOUNTER — Ambulatory Visit
Admission: RE | Admit: 2018-07-05 | Discharge: 2018-07-05 | Disposition: A | Discharge status: Home / Self Care | Payer: 59 | Source: Ambulatory Visit | Attending: Family Medicine | Admitting: Family Medicine

## 2018-07-05 DIAGNOSIS — R945 Abnormal results of liver function studies: Secondary | ICD-10-CM

## 2018-07-05 DIAGNOSIS — K7689 Other specified diseases of liver: Secondary | ICD-10-CM | POA: Diagnosis not present

## 2018-07-05 DIAGNOSIS — R7989 Other specified abnormal findings of blood chemistry: Secondary | ICD-10-CM

## 2018-07-05 DIAGNOSIS — F1020 Alcohol dependence, uncomplicated: Secondary | ICD-10-CM

## 2018-07-14 MED FILL — HYDROCODON-APAP 5-325: 5-325 | 30 days supply | Qty: 90 | Fill #0

## 2018-08-11 MED FILL — HYDROCODON-APAP 5-325: 5-325 | 30 days supply | Qty: 90 | Fill #0

## 2018-08-11 MED FILL — RALOXIFENE HCL 60 MG TABS: 60 | 90 days supply | Qty: 90 | Fill #3

## 2018-08-14 DIAGNOSIS — G894 Chronic pain syndrome: Secondary | ICD-10-CM | POA: Diagnosis not present

## 2018-08-14 DIAGNOSIS — Z79899 Other long term (current) drug therapy: Secondary | ICD-10-CM | POA: Diagnosis not present

## 2018-08-14 DIAGNOSIS — Z79891 Long term (current) use of opiate analgesic: Secondary | ICD-10-CM | POA: Diagnosis not present

## 2018-08-30 DIAGNOSIS — Z79899 Other long term (current) drug therapy: Secondary | ICD-10-CM | POA: Diagnosis not present

## 2018-09-07 MED FILL — ALPRAZolam 1 MG TABS: 1 | 90 days supply | Qty: 180 | Fill #1

## 2018-09-08 MED FILL — HYDROCODON-APAP 5-325: 5-325 | 30 days supply | Qty: 90 | Fill #0

## 2018-10-02 ENCOUNTER — Other Ambulatory Visit: Payer: Self-pay | Admitting: Family Medicine

## 2018-10-02 ENCOUNTER — Other Ambulatory Visit (HOSPITAL_BASED_OUTPATIENT_CLINIC_OR_DEPARTMENT_OTHER): Payer: Self-pay | Admitting: Obstetrics & Gynecology

## 2018-10-02 DIAGNOSIS — I10 Essential (primary) hypertension: Secondary | ICD-10-CM

## 2018-10-02 DIAGNOSIS — Z1231 Encounter for screening mammogram for malignant neoplasm of breast: Secondary | ICD-10-CM

## 2018-10-02 MED ORDER — METOPROLOL SUCCINATE ER 25 MG PO TB24
25.0000 mg | ORAL_TABLET | Freq: Every day | ORAL | 0 refills | Status: DC
Start: 2018-10-02 — End: 2018-12-20

## 2018-10-02 MED FILL — METOPROLOL SUCCINATE ER 25: 25 | 90 days supply | Qty: 90 | Fill #0

## 2018-10-02 NOTE — Telephone Encounter (Signed)
Copied from [place] [id number]. Topic: Quick Communication - Rx Refill/Question >> Oct 02, 2018 11:44 AM Bea Graff, NT wrote: Medication: metoprolol succinate (TOPROL-XL) 25 MG 24 hr tablet  Has the patient contacted their pharmacy? Yes.   (Agent: If no, request that the patient contact the pharmacy for the refill.) (Agent: If yes, when and what did the pharmacy advise?)  Preferred Pharmacy (with phone number or street name): [place], [place] - [place] [phone] (Phone) [phone] (Fax)    Agent: Please be advised that RX refills may take up to 3 business days. We ask that you follow-up with your pharmacy.

## 2018-10-06 MED FILL — HYDROCODON-APAP 5-325: 5-325 | 30 days supply | Qty: 90 | Fill #0

## 2018-10-09 ENCOUNTER — Encounter (HOSPITAL_BASED_OUTPATIENT_CLINIC_OR_DEPARTMENT_OTHER): Payer: Self-pay

## 2018-10-09 ENCOUNTER — Ambulatory Visit (HOSPITAL_BASED_OUTPATIENT_CLINIC_OR_DEPARTMENT_OTHER): Payer: 59

## 2018-10-16 ENCOUNTER — Encounter (HOSPITAL_BASED_OUTPATIENT_CLINIC_OR_DEPARTMENT_OTHER): Payer: Self-pay

## 2018-10-16 ENCOUNTER — Inpatient Hospital Stay (HOSPITAL_BASED_OUTPATIENT_CLINIC_OR_DEPARTMENT_OTHER): Admission: RE | Admit: 2018-10-16 | Payer: 59 | Source: Ambulatory Visit

## 2018-10-23 DIAGNOSIS — H43391 Other vitreous opacities, right eye: Secondary | ICD-10-CM | POA: Diagnosis not present

## 2018-10-23 DIAGNOSIS — H2513 Age-related nuclear cataract, bilateral: Secondary | ICD-10-CM | POA: Diagnosis not present

## 2018-10-23 DIAGNOSIS — H16223 Keratoconjunctivitis sicca, not specified as Sjogren's, bilateral: Secondary | ICD-10-CM | POA: Diagnosis not present

## 2018-10-23 DIAGNOSIS — H43813 Vitreous degeneration, bilateral: Secondary | ICD-10-CM | POA: Diagnosis not present

## 2018-10-23 DIAGNOSIS — H04123 Dry eye syndrome of bilateral lacrimal glands: Secondary | ICD-10-CM | POA: Diagnosis not present

## 2018-10-23 DIAGNOSIS — H43811 Vitreous degeneration, right eye: Secondary | ICD-10-CM | POA: Diagnosis not present

## 2018-10-23 DIAGNOSIS — H10413 Chronic giant papillary conjunctivitis, bilateral: Secondary | ICD-10-CM | POA: Diagnosis not present

## 2018-10-23 DIAGNOSIS — H3561 Retinal hemorrhage, right eye: Secondary | ICD-10-CM | POA: Diagnosis not present

## 2018-10-27 ENCOUNTER — Ambulatory Visit (HOSPITAL_BASED_OUTPATIENT_CLINIC_OR_DEPARTMENT_OTHER)
Admission: RE | Admit: 2018-10-27 | Discharge: 2018-10-27 | Disposition: A | Discharge status: Home / Self Care | Payer: 59 | Source: Ambulatory Visit | Attending: Obstetrics & Gynecology | Admitting: Obstetrics & Gynecology

## 2018-10-27 DIAGNOSIS — Z1231 Encounter for screening mammogram for malignant neoplasm of breast: Secondary | ICD-10-CM | POA: Insufficient documentation

## 2018-11-03 MED FILL — HYDROCODON-APAP 5-325: 5-325 | 30 days supply | Qty: 90 | Fill #0

## 2018-11-09 DIAGNOSIS — H2513 Age-related nuclear cataract, bilateral: Secondary | ICD-10-CM | POA: Diagnosis not present

## 2018-11-09 DIAGNOSIS — H43813 Vitreous degeneration, bilateral: Secondary | ICD-10-CM | POA: Diagnosis not present

## 2018-11-09 DIAGNOSIS — H35421 Microcystoid degeneration of retina, right eye: Secondary | ICD-10-CM | POA: Diagnosis not present

## 2018-11-09 DIAGNOSIS — H43391 Other vitreous opacities, right eye: Secondary | ICD-10-CM | POA: Diagnosis not present

## 2018-11-17 DIAGNOSIS — Z01419 Encounter for gynecological examination (general) (routine) without abnormal findings: Secondary | ICD-10-CM | POA: Diagnosis not present

## 2018-11-17 DIAGNOSIS — Z6833 Body mass index (BMI) 33.0-33.9, adult: Secondary | ICD-10-CM | POA: Diagnosis not present

## 2018-11-17 DIAGNOSIS — Z1159 Encounter for screening for other viral diseases: Secondary | ICD-10-CM | POA: Diagnosis not present

## 2018-11-17 DIAGNOSIS — Z114 Encounter for screening for human immunodeficiency virus [HIV]: Secondary | ICD-10-CM | POA: Diagnosis not present

## 2018-11-17 DIAGNOSIS — Z118 Encounter for screening for other infectious and parasitic diseases: Secondary | ICD-10-CM | POA: Diagnosis not present

## 2018-11-17 DIAGNOSIS — Z113 Encounter for screening for infections with a predominantly sexual mode of transmission: Secondary | ICD-10-CM | POA: Diagnosis not present

## 2018-11-17 MED FILL — RALOXIFENE HCL 60 MG TABS: 60 | 90 days supply | Qty: 90 | Fill #0

## 2018-11-17 MED FILL — FLUCONAZOLE 150 MG TABS: 150 | 1 days supply | Qty: 1 | Fill #0

## 2018-12-01 MED FILL — HYDROCODON-APAP 5-325: 5-325 | 30 days supply | Qty: 90 | Fill #0

## 2018-12-19 DIAGNOSIS — G894 Chronic pain syndrome: Secondary | ICD-10-CM | POA: Diagnosis not present

## 2018-12-20 ENCOUNTER — Other Ambulatory Visit: Payer: Self-pay | Admitting: Family Medicine

## 2018-12-20 DIAGNOSIS — I10 Essential (primary) hypertension: Secondary | ICD-10-CM

## 2018-12-20 MED FILL — METOPROLOL SUCCINATE ER 25: 25 | 90 days supply | Qty: 90 | Fill #0

## 2018-12-22 MED FILL — ALPRAZolam 1 MG TABS: 1 | 90 days supply | Qty: 180 | Fill #0

## 2018-12-27 MED FILL — HYDROCODON-APAP 5-325: 5-325 | 30 days supply | Qty: 90 | Fill #0

## 2019-01-20 MED FILL — HYDROCODON-APAP 5-325: 5-325 | 30 days supply | Qty: 90 | Fill #0

## 2019-02-15 MED FILL — HYDROCODON-APAP 5-325: 5-325 | 30 days supply | Qty: 90 | Fill #0

## 2019-03-13 MED FILL — HYDROCODON-APAP 5-325: 5-325 | 30 days supply | Qty: 90 | Fill #0

## 2019-03-21 MED FILL — ALPRAZolam 1 MG TABS: 1 | 90 days supply | Qty: 180 | Fill #0

## 2019-04-10 MED FILL — HYDROCODON-APAP 5-325: 5-325 | 30 days supply | Qty: 90 | Fill #0

## 2019-04-26 ENCOUNTER — Other Ambulatory Visit: Payer: Self-pay | Admitting: Family Medicine

## 2019-04-26 DIAGNOSIS — I10 Essential (primary) hypertension: Secondary | ICD-10-CM

## 2019-05-07 MED FILL — HYDROCODON-APAP 5-325: 5-325 | 30 days supply | Qty: 90 | Fill #0

## 2019-05-10 DIAGNOSIS — G894 Chronic pain syndrome: Secondary | ICD-10-CM | POA: Diagnosis not present

## 2019-05-28 MED FILL — RALOXIFENE HCL 60 MG TABS: 60 | 90 days supply | Qty: 90 | Fill #1

## 2019-06-04 ENCOUNTER — Other Ambulatory Visit: Payer: Self-pay

## 2019-06-04 ENCOUNTER — Ambulatory Visit: Payer: 59 | Admitting: Family Medicine

## 2019-06-04 ENCOUNTER — Encounter: Payer: Self-pay | Admitting: Family Medicine

## 2019-06-04 VITALS — BP 120/86 | HR 86 | Temp 97.8°F | Resp 17 | Ht 66.5 in | Wt 200.5 lb

## 2019-06-04 DIAGNOSIS — R945 Abnormal results of liver function studies: Secondary | ICD-10-CM

## 2019-06-04 DIAGNOSIS — I10 Essential (primary) hypertension: Secondary | ICD-10-CM | POA: Diagnosis not present

## 2019-06-04 DIAGNOSIS — R3589 Other polyuria: Secondary | ICD-10-CM

## 2019-06-04 DIAGNOSIS — Z5181 Encounter for therapeutic drug level monitoring: Secondary | ICD-10-CM

## 2019-06-04 DIAGNOSIS — F1020 Alcohol dependence, uncomplicated: Secondary | ICD-10-CM

## 2019-06-04 DIAGNOSIS — R7989 Other specified abnormal findings of blood chemistry: Secondary | ICD-10-CM

## 2019-06-04 DIAGNOSIS — R358 Other polyuria: Secondary | ICD-10-CM

## 2019-06-04 DIAGNOSIS — Z23 Encounter for immunization: Secondary | ICD-10-CM | POA: Diagnosis not present

## 2019-06-04 LAB — POCT URINALYSIS DIP (MANUAL ENTRY)
Bilirubin, UA: NEGATIVE
Blood, UA: NEGATIVE
Glucose, UA: NEGATIVE mg/dL
Leukocytes, UA: NEGATIVE
Nitrite, UA: NEGATIVE
Protein Ur, POC: NEGATIVE mg/dL
Spec Grav, UA: 1.025 (ref 1.010–1.025)
Urobilinogen, UA: 0.2 E.U./dL
pH, UA: 5 (ref 5.0–8.0)

## 2019-06-04 MED ORDER — METOPROLOL SUCCINATE ER 25 MG PO TB24: 25.0000 mg | ORAL_TABLET | Freq: Every day | ORAL | 1 refills | Status: DC

## 2019-06-04 MED FILL — METOPROLOL SUCCINATE ER 25: 25 | 90 days supply | Qty: 90 | Fill #0

## 2019-06-04 NOTE — Patient Instructions (Signed)
° ° ° °  If you have lab work done today you will be contacted with your lab results within the next 2 weeks.  If you have not heard from us then please contact us. The fastest way to get your results is to register for My Chart. ° ° °IF you received an x-ray today, you will receive an invoice from [facility]. Please contact [facility] at [phone] with questions or concerns regarding your invoice.  ° °IF you received labwork today, you will receive an invoice from [facility]. Please contact [facility] at [phone] with questions or concerns regarding your invoice.  ° °Our billing staff will not be able to assist you with questions regarding bills from these companies. ° °You will be contacted with the lab results as soon as they are available. The fastest way to get your results is to activate your My Chart account. Instructions are located on the last page of this paperwork. If you have not heard from us regarding the results in 2 weeks, please contact this office. °  ° ° ° °

## 2019-06-04 NOTE — Progress Notes (Signed)
Established Patient Office Visit  Subjective:  Patient ID: Amanda [name], female    DOB: [birth year]  Age: 58 y.o. MRN: [id number]  CC:  Chief Complaint  Patient presents with  . med check    needs refill on metoprolol and would pneumo vaccine     HPI Amanda [name] presents for   She reports that she sold her house in [place] and moved to [place] now that she is working from home.   Alcoholism She is drinking less now that she is working [place] home. [place] She is drinking literature and doing podcasts She does not feel isolated at all Depression screen [facility] 2/9 06/04/2019 06/27/2018 03/01/2018 01/26/2018 11/02/2017  Decreased Interest 0 0 0 0 0  Down, Depressed, Hopeless 0 0 0 0 0  PHQ - 2 Score 0 0 0 0 0  Altered sleeping - - - - -  Tired, decreased energy - - - - -  Change in appetite - - - - -  Feeling bad or failure about yourself  - - - - -  Trouble concentrating - - - - -  Moving slowly or fidgety/restless - - - - -  Suicidal thoughts - - - - -  PHQ-9 Score - - - - -  Difficult doing work/chores - - - - -   Hypertension: Patient here for follow-up of elevated blood pressure. She is exercising and is adherent to low salt diet.  Blood pressure is well controlled at home. Cardiac symptoms none. Patient denies chest pain, claudication, exertional chest pressure/discomfort, irregular heart beat, lower extremity edema, orthopnea and palpitations.  Cardiovascular risk factors: hypertension. Use of agents associated with hypertension: none. History of target organ damage: none.    She is also on metprolol for tachyarrhythmia s/p ablation She denies dizziness She is exercising No weakness or fatigue  BP Readings from Last 3 Encounters:  06/04/19 120/86  06/27/18 117/81  03/01/18 111/75     Past Medical History:  Diagnosis Date  . Anxiety   . Arthritis   . Congenital patella maltracking right knee  .  Depression   . Diverticulitis    perforated; required partial colectomy  . GERD (gastroesophageal reflux disease)    takes Zantac daily  . Perforated diverticulitis   . Peripheral edema    takes HCTZ daily as needed  . Pleural effusion, bilateral 11/28/11  . Tachyarrhythmia    s/p ablation-takes Metoprolol daily    Past Surgical History:  Procedure Laterality Date  . CARDIAC ELECTROPHYSIOLOGY STUDY AND ABLATION  2006   tachycardia  . CLOSED REDUCTION NASAL FRACTURE N/A 05/20/2016   Procedure: CLOSED REDUCTION NASAL FRACTURE AND REPAIR OF NOSE AND LIP LACERATION;  Surgeon: Loel Lofty Dillingham, DO;  Location: [facility];  Service: Plastics;  Laterality: N/A;  . COLON SURGERY    . COSMETIC SURGERY  05/2016  . [place]  . EXPLORATORY LAPAROTOMY  GYN fertility 1990's  . EYE SURGERY    . GANGLION CYST EXCISION Left 1997  . HERNIA REPAIR  1988  . KNEE SURGERY Right 2012   lateral release  . LASIK    . left small fingernail removed  2006  . PARTIAL COLECTOMY  01/28/12  . PILONIDAL CYST EXCISION  1983  . RHINOPLASTY    . SHOULDER ARTHROSCOPY W/ ROTATOR CUFF REPAIR Right 2010 repair bicep    Family History  Problem Relation Age of Onset  .  Heart failure Mother   . Stroke Mother   . Cancer Father 40       brain tumor  . Stomach cancer Paternal Uncle   . Colon cancer Paternal Uncle   . Stomach cancer Cousin   . Esophageal cancer Neg Hx   . Rectal cancer Neg Hx     Social History   Socioeconomic History  . Marital status: Married    Spouse name: Not on file  . Number of children: Not on file  . Years of education: Not on file  . Highest education level: Not on file  Occupational History  . Not on file  Social Needs  . Financial resource strain: Not on file  . Food insecurity    Worry: Not on file    Inability: Not on file  . Transportation needs    Medical: Not on file    Non-medical: Not on file  Tobacco Use  . Smoking status: Former Smoker     Packs/day: 0.50    Years: 32.00    Pack years: 16.00    Types: Cigarettes  . Smokeless tobacco: Never Used  . Tobacco comment: hasn't smoked since 05/10/16  Substance and Sexual Activity  . Alcohol use: Yes    Alcohol/week: 2.0 standard drinks    Types: 1 Glasses of wine, 1 Cans of beer per week    Comment: 1 pint vodka a day  . Drug use: No  . Sexual activity: Not on file  Lifestyle  . Physical activity    Days per week: Not on file    Minutes per session: Not on file  . Stress: Not on file  Relationships  . Social Herbalist on phone: Not on file    Gets together: Not on file    Attends religious service: Not on file    Active member of club or organization: Not on file    Attends meetings of clubs or organizations: Not on file    Relationship status: Not on file  . Intimate partner violence    Fear of current or ex partner: Not on file    Emotionally abused: Not on file    Physically abused: Not on file    Forced sexual activity: Not on file  Other Topics Concern  . Not on file  Social History Narrative  . Not on file    Outpatient Medications Prior to Visit  Medication Sig Dispense Refill  . Biotin 1000 MCG tablet Take 1,000 mcg by mouth 3 (three) times daily.    Marland Kitchen HYDROcodone-acetaminophen (NORCO/VICODIN) 5-325 MG tablet Take 1 tablet by mouth every 6 (six) hours as needed for moderate pain.    Marland Kitchen ibuprofen (ADVIL,MOTRIN) 600 MG tablet Take 1 tablet (600 mg total) by mouth every 6 (six) hours as needed. 30 tablet 0  . LUTEIN PO Take by mouth.    . metoprolol succinate (TOPROL-XL) 25 MG 24 hr tablet TAKE 1 TABLET BY MOUTH ONCE A DAY 90 tablet 0  . Multiple Vitamin (MULTIVITAMIN) tablet Take 1 tablet by mouth every evening.     . raloxifene (EVISTA) 60 MG tablet Take 60 mg by mouth daily.    . clonazePAM (KLONOPIN) 0.5 MG tablet Take 0.5 mg by mouth daily.     . ranitidine (ZANTAC) 150 MG tablet Take 150 mg by mouth every evening.     No  facility-administered medications prior to visit.     Allergies  Allergen Reactions  . Naltrexone   .  No Known Allergies     ROS Review of Systems Review of Systems  Constitutional: Negative for activity change, appetite change, chills and fever.  HENT: Negative for congestion, nosebleeds, trouble swallowing and voice change.   Respiratory: Negative for cough, shortness of breath and wheezing.   Gastrointestinal: Negative for diarrhea, nausea and vomiting.  Genitourinary: +polyuria but drinks water Musculoskeletal: Negative for back pain, joint swelling and neck pain.  Neurological: Negative for dizziness, speech difficulty, light-headedness and numbness.  See HPI. All other review of systems negative.     Objective:    Physical Exam  BP 120/86 (BP Location: Right Arm, Patient Position: Sitting, Cuff Size: Large)   Pulse 86   Temp 97.8 F (36.6 C) (Oral)   Resp 17   Ht 5' 6.5" (1.689 m)   Wt 200 lb 7.5 oz (90.9 kg)   LMP 12/17/2013   SpO2 98%   BMI 31.87 kg/m  Wt Readings from Last 3 Encounters:  06/04/19 200 lb 7.5 oz (90.9 kg)  06/27/18 201 lb 6.4 oz (91.4 kg)  03/01/18 195 lb (88.5 kg)   Physical Exam  Constitutional: Oriented to person, place, and time. Appears well-developed and well-nourished.  HENT:  Head: Normocephalic and atraumatic.  Eyes: Conjunctivae and EOM are normal.  Cardiovascular: Normal rate, regular rhythm, normal heart sounds and intact distal pulses.  No murmur heard. Pulmonary/Chest: Effort normal and breath sounds normal. No stridor. No respiratory distress. Has no wheezes.  Abdomen: nondistended, normoactive bs, soft, nontender Neurological: Is alert and oriented to person, place, and time.  Skin: Skin is warm. Capillary refill takes less than 2 seconds.  Psychiatric: Has a normal mood and affect. Behavior is normal. Judgment and thought content normal.    Health Maintenance Due  Topic Date Due  . PAP SMEAR-Modifier  06/13/2018  .  INFLUENZA VACCINE  04/14/2019    There are no preventive care reminders to display for this patient.  Lab Results  Component Value Date   TSH 1.103 04/21/2015   Lab Results  Component Value Date   WBC 7.9 06/01/2016   HGB 12.2 06/01/2016   HCT 36.8 06/01/2016   MCV 101.4 (H) 06/01/2016   PLT 257 06/01/2016   Lab Results  Component Value Date   NA 143 11/23/2017   K 4.2 11/23/2017   CO2 19 (L) 11/23/2017   GLUCOSE 82 11/23/2017   BUN 21 11/23/2017   CREATININE 0.80 11/23/2017   BILITOT 0.6 06/27/2018   ALKPHOS 146 (H) 06/27/2018   AST 96 (H) 06/27/2018   ALT 113 (H) 06/27/2018   PROT 7.4 06/27/2018   ALBUMIN 4.5 06/27/2018   CALCIUM 9.4 11/23/2017   ANIONGAP 9 06/01/2016   Lab Results  Component Value Date   CHOL 177 01/07/2017   Lab Results  Component Value Date   HDL 83 01/07/2017   Lab Results  Component Value Date   LDLCALC 62 01/07/2017   Lab Results  Component Value Date   TRIG 159 (H) 01/07/2017   Lab Results  Component Value Date   CHOLHDL 2.1 01/07/2017   No results found for: HGBA1C    Assessment & Plan:   Problem List Items Addressed This Visit      Cardiovascular and Mediastinum   Essential hypertension - Patient's blood pressure is at goal of 139/89 or less. Condition is stable. Continue current medications and treatment plan. I recommend that you exercise for 30-45 minutes 5 days a week. I also recommend a balanced diet with fruits and  vegetables every day, lean meats, and little fried foods. The DASH diet (you can find this online) is a good example of this.       Other   Encounter for medication monitoring  - No tachyarrhythmia while on metoprolol   Alcoholism ([place]) - improving   Elevated LFTs - will monitor    Other Visit Diagnoses    Need for prophylactic vaccination and inoculation against influenza    -  Primary   Need for prophylactic vaccination against Streptococcus pneumoniae (pneumococcus)          No orders of the  defined types were placed in this encounter.   Follow-up: No follow-ups on file.    Forrest Moron, MD

## 2019-06-05 LAB — COMPREHENSIVE METABOLIC PANEL
ALT: 20 IU/L (ref 0–32)
AST: 23 IU/L (ref 0–40)
Albumin/Globulin Ratio: 1.8 (ref 1.2–2.2)
Albumin: 4.8 g/dL (ref 3.8–4.9)
Alkaline Phosphatase: 80 IU/L (ref 39–117)
BUN/Creatinine Ratio: 21 (ref 9–23)
BUN: 16 mg/dL (ref 6–24)
Bilirubin Total: 0.6 mg/dL (ref 0.0–1.2)
CO2: 26 mmol/L (ref 20–29)
Calcium: 9.8 mg/dL (ref 8.7–10.2)
Chloride: 97 mmol/L (ref 96–106)
Creatinine, Ser: 0.77 mg/dL (ref 0.57–1.00)
GFR calc Af Amer: 98 mL/min/{1.73_m2} (ref >59)
GFR calc non Af Amer: 85 mL/min/{1.73_m2} (ref >59)
Globulin, Total: 2.7 g/dL (ref 1.5–4.5)
Glucose: 98 mg/dL (ref 65–99)
Potassium: 3.7 mmol/L (ref 3.5–5.2)
Sodium: 140 mmol/L (ref 134–144)
Total Protein: 7.5 g/dL (ref 6.0–8.5)

## 2019-06-05 LAB — LIPID PANEL
Chol/HDL Ratio: 2.3 ratio (ref 0.0–4.4)
Cholesterol, Total: 202 mg/dL — ABNORMAL HIGH (ref 100–199)
HDL: 89 mg/dL (ref >39)
LDL Chol Calc (NIH): 97 mg/dL (ref 0–99)
Triglycerides: 90 mg/dL (ref 0–149)
VLDL Cholesterol Cal: 16 mg/dL (ref 5–40)

## 2019-06-05 LAB — HEMOGLOBIN A1C
Est. average glucose Bld gHb Est-mCnc: 103 mg/dL
Hgb A1c MFr Bld: 5.2 % (ref 4.8–5.6)

## 2019-06-06 ENCOUNTER — Encounter: Payer: Self-pay | Admitting: Family Medicine

## 2019-06-06 MED FILL — HYDROCODON-APAP 5-325: 5-325 | 30 days supply | Qty: 90 | Fill #0

## 2019-06-20 MED FILL — ALPRAZolam 1 MG TABS: 1 | 90 days supply | Qty: 180 | Fill #0

## 2019-07-06 MED FILL — HYDROCODON-APAP 5-325: 5-325 | 30 days supply | Qty: 90 | Fill #0

## 2019-08-20 MED FILL — RALOXIFENE HCL 60 MG TABS: 60 | 90 days supply | Qty: 90 | Fill #2

## 2019-08-30 MED FILL — METOPROLOL SUCCINATE ER 25: 25 | 90 days supply | Qty: 90 | Fill #1

## 2019-09-18 MED FILL — ALPRAZolam 1 MG TABS: 1 | 90 days supply | Qty: 180 | Fill #1

## 2019-10-01 DIAGNOSIS — Z79891 Long term (current) use of opiate analgesic: Secondary | ICD-10-CM | POA: Diagnosis not present

## 2019-10-01 DIAGNOSIS — Z79899 Other long term (current) drug therapy: Secondary | ICD-10-CM | POA: Diagnosis not present

## 2019-10-01 DIAGNOSIS — Z5181 Encounter for therapeutic drug level monitoring: Secondary | ICD-10-CM | POA: Diagnosis not present

## 2019-10-01 DIAGNOSIS — M25561 Pain in right knee: Secondary | ICD-10-CM | POA: Diagnosis not present

## 2019-10-03 ENCOUNTER — Other Ambulatory Visit (HOSPITAL_BASED_OUTPATIENT_CLINIC_OR_DEPARTMENT_OTHER): Payer: Self-pay | Admitting: Obstetrics & Gynecology

## 2019-10-03 DIAGNOSIS — Z1231 Encounter for screening mammogram for malignant neoplasm of breast: Secondary | ICD-10-CM

## 2019-10-29 DIAGNOSIS — Z79891 Long term (current) use of opiate analgesic: Secondary | ICD-10-CM | POA: Diagnosis not present

## 2019-10-29 DIAGNOSIS — Z79899 Other long term (current) drug therapy: Secondary | ICD-10-CM | POA: Diagnosis not present

## 2019-10-29 DIAGNOSIS — M25561 Pain in right knee: Secondary | ICD-10-CM | POA: Diagnosis not present

## 2019-11-05 MED FILL — HYDROCODON-APAP 5-325: 5-325 | 90 days supply | Qty: 90 | Fill #0

## 2019-11-30 DIAGNOSIS — Z79899 Other long term (current) drug therapy: Secondary | ICD-10-CM | POA: Diagnosis not present

## 2019-11-30 DIAGNOSIS — Z79891 Long term (current) use of opiate analgesic: Secondary | ICD-10-CM | POA: Diagnosis not present

## 2019-11-30 DIAGNOSIS — M25561 Pain in right knee: Secondary | ICD-10-CM | POA: Diagnosis not present

## 2019-12-03 ENCOUNTER — Ambulatory Visit (HOSPITAL_BASED_OUTPATIENT_CLINIC_OR_DEPARTMENT_OTHER): Payer: 59

## 2019-12-03 ENCOUNTER — Encounter (HOSPITAL_BASED_OUTPATIENT_CLINIC_OR_DEPARTMENT_OTHER): Payer: Self-pay

## 2019-12-21 ENCOUNTER — Other Ambulatory Visit: Payer: Self-pay | Admitting: Family Medicine

## 2019-12-21 DIAGNOSIS — I10 Essential (primary) hypertension: Secondary | ICD-10-CM

## 2019-12-21 MED FILL — METOPROLOL SUCCINATE ER 25: 25 | 30 days supply | Qty: 30 | Fill #0

## 2019-12-21 NOTE — Telephone Encounter (Signed)
Call to patient to schedule appointment- patient states she has moved out of town and needs to find care. 30 day courtesy RF given.

## 2020-01-25 DIAGNOSIS — Z1331 Encounter for screening for depression: Secondary | ICD-10-CM | POA: Diagnosis not present

## 2020-01-25 DIAGNOSIS — I10 Essential (primary) hypertension: Secondary | ICD-10-CM | POA: Diagnosis not present

## 2020-01-25 DIAGNOSIS — Z6834 Body mass index (BMI) 34.0-34.9, adult: Secondary | ICD-10-CM | POA: Diagnosis not present

## 2020-01-25 DIAGNOSIS — M810 Age-related osteoporosis without current pathological fracture: Secondary | ICD-10-CM | POA: Diagnosis not present

## 2020-02-27 DIAGNOSIS — Z5181 Encounter for therapeutic drug level monitoring: Secondary | ICD-10-CM | POA: Diagnosis not present

## 2020-02-27 DIAGNOSIS — Z79899 Other long term (current) drug therapy: Secondary | ICD-10-CM | POA: Diagnosis not present

## 2020-02-27 DIAGNOSIS — Z79891 Long term (current) use of opiate analgesic: Secondary | ICD-10-CM | POA: Diagnosis not present

## 2020-04-07 DIAGNOSIS — Z6834 Body mass index (BMI) 34.0-34.9, adult: Secondary | ICD-10-CM | POA: Diagnosis not present

## 2020-04-07 DIAGNOSIS — Z01419 Encounter for gynecological examination (general) (routine) without abnormal findings: Secondary | ICD-10-CM | POA: Diagnosis not present

## 2020-04-07 DIAGNOSIS — Z1231 Encounter for screening mammogram for malignant neoplasm of breast: Secondary | ICD-10-CM | POA: Diagnosis not present

## 2020-04-11 DIAGNOSIS — I10 Essential (primary) hypertension: Secondary | ICD-10-CM | POA: Diagnosis not present

## 2020-04-11 DIAGNOSIS — M810 Age-related osteoporosis without current pathological fracture: Secondary | ICD-10-CM | POA: Diagnosis not present

## 2020-04-12 DIAGNOSIS — Z1231 Encounter for screening mammogram for malignant neoplasm of breast: Secondary | ICD-10-CM | POA: Diagnosis not present

## 2020-04-25 DIAGNOSIS — Z23 Encounter for immunization: Secondary | ICD-10-CM | POA: Diagnosis not present

## 2020-04-25 DIAGNOSIS — E559 Vitamin D deficiency, unspecified: Secondary | ICD-10-CM | POA: Diagnosis not present

## 2020-04-25 DIAGNOSIS — M810 Age-related osteoporosis without current pathological fracture: Secondary | ICD-10-CM | POA: Diagnosis not present

## 2020-04-25 DIAGNOSIS — I10 Essential (primary) hypertension: Secondary | ICD-10-CM | POA: Diagnosis not present

## 2020-04-25 DIAGNOSIS — E782 Mixed hyperlipidemia: Secondary | ICD-10-CM | POA: Diagnosis not present

## 2020-04-29 ENCOUNTER — Other Ambulatory Visit (HOSPITAL_COMMUNITY): Payer: Self-pay | Admitting: Family Medicine

## 2020-05-06 DIAGNOSIS — D2372 Other benign neoplasm of skin of left lower limb, including hip: Secondary | ICD-10-CM | POA: Diagnosis not present

## 2020-05-06 DIAGNOSIS — L821 Other seborrheic keratosis: Secondary | ICD-10-CM | POA: Diagnosis not present

## 2020-05-06 DIAGNOSIS — D2362 Other benign neoplasm of skin of left upper limb, including shoulder: Secondary | ICD-10-CM | POA: Diagnosis not present

## 2020-05-06 DIAGNOSIS — L814 Other melanin hyperpigmentation: Secondary | ICD-10-CM | POA: Diagnosis not present

## 2020-05-06 DIAGNOSIS — L720 Epidermal cyst: Secondary | ICD-10-CM | POA: Diagnosis not present

## 2020-05-06 MED FILL — METOPROLOL SUCCINATE ER 50: 50 | 90 days supply | Qty: 90 | Fill #0

## 2020-05-21 DIAGNOSIS — Z79899 Other long term (current) drug therapy: Secondary | ICD-10-CM | POA: Diagnosis not present

## 2020-05-21 DIAGNOSIS — Z79891 Long term (current) use of opiate analgesic: Secondary | ICD-10-CM | POA: Diagnosis not present

## 2020-05-21 DIAGNOSIS — G894 Chronic pain syndrome: Secondary | ICD-10-CM | POA: Diagnosis not present

## 2020-06-11 DIAGNOSIS — M79671 Pain in right foot: Secondary | ICD-10-CM | POA: Diagnosis not present

## 2020-06-11 DIAGNOSIS — M7671 Peroneal tendinitis, right leg: Secondary | ICD-10-CM | POA: Diagnosis not present

## 2020-06-19 DIAGNOSIS — Z20822 Contact with and (suspected) exposure to covid-19: Secondary | ICD-10-CM | POA: Diagnosis not present

## 2020-07-03 DIAGNOSIS — Z0130 Encounter for examination of blood pressure without abnormal findings: Secondary | ICD-10-CM | POA: Diagnosis not present

## 2020-07-03 DIAGNOSIS — I10 Essential (primary) hypertension: Secondary | ICD-10-CM | POA: Diagnosis not present

## 2020-07-03 DIAGNOSIS — Z0131 Encounter for examination of blood pressure with abnormal findings: Secondary | ICD-10-CM | POA: Diagnosis not present

## 2020-07-03 DIAGNOSIS — H6691 Otitis media, unspecified, right ear: Secondary | ICD-10-CM | POA: Diagnosis not present

## 2020-07-03 DIAGNOSIS — H6121 Impacted cerumen, right ear: Secondary | ICD-10-CM | POA: Diagnosis not present

## 2020-07-07 DIAGNOSIS — M7671 Peroneal tendinitis, right leg: Secondary | ICD-10-CM | POA: Diagnosis not present

## 2020-07-28 MED FILL — METOPROLOL SUCCINATE ER 50: 50 | 90 days supply | Qty: 90 | Fill #1

## 2020-08-18 DIAGNOSIS — Z79899 Other long term (current) drug therapy: Secondary | ICD-10-CM | POA: Diagnosis not present

## 2020-08-18 DIAGNOSIS — G894 Chronic pain syndrome: Secondary | ICD-10-CM | POA: Diagnosis not present

## 2020-08-18 DIAGNOSIS — Z5181 Encounter for therapeutic drug level monitoring: Secondary | ICD-10-CM | POA: Diagnosis not present

## 2020-09-29 DIAGNOSIS — I10 Essential (primary) hypertension: Secondary | ICD-10-CM | POA: Diagnosis not present

## 2020-10-02 DIAGNOSIS — H43813 Vitreous degeneration, bilateral: Secondary | ICD-10-CM | POA: Diagnosis not present

## 2020-10-02 DIAGNOSIS — H524 Presbyopia: Secondary | ICD-10-CM | POA: Diagnosis not present

## 2020-10-02 DIAGNOSIS — H2513 Age-related nuclear cataract, bilateral: Secondary | ICD-10-CM | POA: Diagnosis not present

## 2020-10-31 DIAGNOSIS — E782 Mixed hyperlipidemia: Secondary | ICD-10-CM | POA: Diagnosis not present

## 2020-10-31 DIAGNOSIS — E559 Vitamin D deficiency, unspecified: Secondary | ICD-10-CM | POA: Diagnosis not present

## 2020-10-31 DIAGNOSIS — I10 Essential (primary) hypertension: Secondary | ICD-10-CM | POA: Diagnosis not present

## 2020-11-07 DIAGNOSIS — Z23 Encounter for immunization: Secondary | ICD-10-CM | POA: Diagnosis not present

## 2020-11-07 DIAGNOSIS — Z6833 Body mass index (BMI) 33.0-33.9, adult: Secondary | ICD-10-CM | POA: Diagnosis not present

## 2020-11-07 DIAGNOSIS — Z713 Dietary counseling and surveillance: Secondary | ICD-10-CM | POA: Diagnosis not present

## 2020-11-07 DIAGNOSIS — I10 Essential (primary) hypertension: Secondary | ICD-10-CM | POA: Diagnosis not present

## 2020-11-07 DIAGNOSIS — E559 Vitamin D deficiency, unspecified: Secondary | ICD-10-CM | POA: Diagnosis not present

## 2020-11-07 DIAGNOSIS — E782 Mixed hyperlipidemia: Secondary | ICD-10-CM | POA: Diagnosis not present

## 2020-11-07 DIAGNOSIS — R748 Abnormal levels of other serum enzymes: Secondary | ICD-10-CM | POA: Diagnosis not present

## 2020-11-07 DIAGNOSIS — Z7182 Exercise counseling: Secondary | ICD-10-CM | POA: Diagnosis not present

## 2020-11-07 DIAGNOSIS — K219 Gastro-esophageal reflux disease without esophagitis: Secondary | ICD-10-CM | POA: Diagnosis not present

## 2020-11-09 ENCOUNTER — Other Ambulatory Visit (HOSPITAL_COMMUNITY): Payer: Self-pay

## 2020-11-10 MED FILL — METOPROLOL SUCCINATE ER 50: 50 | 90 days supply | Qty: 90 | Fill #0

## 2020-11-10 MED FILL — LISINOPRIL 20 MG TABS: 20 | 90 days supply | Qty: 90 | Fill #0

## 2020-11-11 ENCOUNTER — Other Ambulatory Visit (HOSPITAL_COMMUNITY): Payer: Self-pay

## 2020-11-14 DIAGNOSIS — Z5181 Encounter for therapeutic drug level monitoring: Secondary | ICD-10-CM | POA: Diagnosis not present

## 2020-11-14 DIAGNOSIS — Z79899 Other long term (current) drug therapy: Secondary | ICD-10-CM | POA: Diagnosis not present

## 2020-11-14 DIAGNOSIS — G894 Chronic pain syndrome: Secondary | ICD-10-CM | POA: Diagnosis not present

## 2020-11-18 DIAGNOSIS — K439 Ventral hernia without obstruction or gangrene: Secondary | ICD-10-CM | POA: Diagnosis not present

## 2020-12-04 ENCOUNTER — Other Ambulatory Visit (HOSPITAL_BASED_OUTPATIENT_CLINIC_OR_DEPARTMENT_OTHER): Payer: Self-pay

## 2020-12-09 DIAGNOSIS — I10 Essential (primary) hypertension: Secondary | ICD-10-CM | POA: Diagnosis not present

## 2020-12-09 DIAGNOSIS — Z79899 Other long term (current) drug therapy: Secondary | ICD-10-CM | POA: Diagnosis not present

## 2020-12-09 DIAGNOSIS — Z01818 Encounter for other preprocedural examination: Secondary | ICD-10-CM | POA: Diagnosis not present

## 2020-12-09 DIAGNOSIS — K436 Other and unspecified ventral hernia with obstruction, without gangrene: Secondary | ICD-10-CM | POA: Diagnosis not present

## 2020-12-15 DIAGNOSIS — K439 Ventral hernia without obstruction or gangrene: Secondary | ICD-10-CM | POA: Diagnosis not present

## 2020-12-15 DIAGNOSIS — I10 Essential (primary) hypertension: Secondary | ICD-10-CM | POA: Diagnosis not present

## 2020-12-15 DIAGNOSIS — K436 Other and unspecified ventral hernia with obstruction, without gangrene: Secondary | ICD-10-CM | POA: Diagnosis not present

## 2020-12-15 DIAGNOSIS — Z79899 Other long term (current) drug therapy: Secondary | ICD-10-CM | POA: Diagnosis not present

## 2020-12-15 DIAGNOSIS — K430 Incisional hernia with obstruction, without gangrene: Secondary | ICD-10-CM | POA: Diagnosis not present

## 2020-12-19 DIAGNOSIS — R748 Abnormal levels of other serum enzymes: Secondary | ICD-10-CM | POA: Diagnosis not present

## 2021-02-16 DIAGNOSIS — G894 Chronic pain syndrome: Secondary | ICD-10-CM | POA: Diagnosis not present

## 2021-02-22 MED FILL — Metoprolol Succinate Tab ER 24HR 50 MG (Tartrate Equiv): ORAL | 90 days supply | Qty: 90 | Fill #0 | Status: AC

## 2021-02-22 MED FILL — Lisinopril Tab 20 MG: ORAL | 90 days supply | Qty: 90 | Fill #0 | Status: AC

## 2021-02-23 ENCOUNTER — Other Ambulatory Visit (HOSPITAL_COMMUNITY): Payer: Self-pay

## 2021-04-18 DIAGNOSIS — Z1231 Encounter for screening mammogram for malignant neoplasm of breast: Secondary | ICD-10-CM | POA: Diagnosis not present

## 2021-05-08 DIAGNOSIS — E782 Mixed hyperlipidemia: Secondary | ICD-10-CM | POA: Diagnosis not present

## 2021-05-08 DIAGNOSIS — I10 Essential (primary) hypertension: Secondary | ICD-10-CM | POA: Diagnosis not present

## 2021-05-14 DIAGNOSIS — G894 Chronic pain syndrome: Secondary | ICD-10-CM | POA: Diagnosis not present

## 2021-05-19 ENCOUNTER — Other Ambulatory Visit (HOSPITAL_COMMUNITY): Payer: Self-pay

## 2021-05-19 DIAGNOSIS — E782 Mixed hyperlipidemia: Secondary | ICD-10-CM | POA: Diagnosis not present

## 2021-05-19 DIAGNOSIS — Z6832 Body mass index (BMI) 32.0-32.9, adult: Secondary | ICD-10-CM | POA: Diagnosis not present

## 2021-05-19 DIAGNOSIS — I10 Essential (primary) hypertension: Secondary | ICD-10-CM | POA: Diagnosis not present

## 2021-05-19 DIAGNOSIS — F411 Generalized anxiety disorder: Secondary | ICD-10-CM | POA: Diagnosis not present

## 2021-05-19 DIAGNOSIS — G894 Chronic pain syndrome: Secondary | ICD-10-CM | POA: Diagnosis not present

## 2021-05-19 DIAGNOSIS — Z1331 Encounter for screening for depression: Secondary | ICD-10-CM | POA: Diagnosis not present

## 2021-05-19 MED ORDER — BUSPIRONE HCL 5 MG PO TABS
ORAL_TABLET | ORAL | 3 refills | Status: AC
  Filled 2021-05-19: qty 120, 30d supply, fill #0
  Filled 2021-07-31: qty 120, 30d supply, fill #1

## 2021-05-21 ENCOUNTER — Other Ambulatory Visit (HOSPITAL_COMMUNITY): Payer: Self-pay

## 2021-05-27 DIAGNOSIS — L814 Other melanin hyperpigmentation: Secondary | ICD-10-CM | POA: Diagnosis not present

## 2021-05-27 DIAGNOSIS — L821 Other seborrheic keratosis: Secondary | ICD-10-CM | POA: Diagnosis not present

## 2021-05-27 DIAGNOSIS — D2372 Other benign neoplasm of skin of left lower limb, including hip: Secondary | ICD-10-CM | POA: Diagnosis not present

## 2021-05-27 DIAGNOSIS — D2362 Other benign neoplasm of skin of left upper limb, including shoulder: Secondary | ICD-10-CM | POA: Diagnosis not present

## 2021-06-04 ENCOUNTER — Other Ambulatory Visit (HOSPITAL_COMMUNITY): Payer: Self-pay

## 2021-06-04 MED FILL — Lisinopril Tab 20 MG: ORAL | 90 days supply | Qty: 90 | Fill #1 | Status: AC

## 2021-06-04 MED FILL — Metoprolol Succinate Tab ER 24HR 50 MG (Tartrate Equiv): ORAL | 90 days supply | Qty: 90 | Fill #1 | Status: AC

## 2021-06-22 DIAGNOSIS — F410 Panic disorder [episodic paroxysmal anxiety] without agoraphobia: Secondary | ICD-10-CM | POA: Diagnosis not present

## 2021-06-22 DIAGNOSIS — F411 Generalized anxiety disorder: Secondary | ICD-10-CM | POA: Diagnosis not present

## 2021-06-22 DIAGNOSIS — F331 Major depressive disorder, recurrent, moderate: Secondary | ICD-10-CM | POA: Diagnosis not present

## 2021-07-27 ENCOUNTER — Other Ambulatory Visit (HOSPITAL_COMMUNITY): Payer: Self-pay

## 2021-07-27 DIAGNOSIS — F410 Panic disorder [episodic paroxysmal anxiety] without agoraphobia: Secondary | ICD-10-CM | POA: Diagnosis not present

## 2021-07-27 DIAGNOSIS — F411 Generalized anxiety disorder: Secondary | ICD-10-CM | POA: Diagnosis not present

## 2021-07-27 DIAGNOSIS — F331 Major depressive disorder, recurrent, moderate: Secondary | ICD-10-CM | POA: Diagnosis not present

## 2021-07-27 MED ORDER — MIRTAZAPINE 15 MG PO TABS
15.0000 mg | ORAL_TABLET | Freq: Every day | ORAL | 0 refills | Status: AC
  Filled 2021-07-27: qty 90, 90d supply, fill #0

## 2021-07-28 ENCOUNTER — Other Ambulatory Visit (HOSPITAL_COMMUNITY): Payer: Self-pay

## 2021-07-31 ENCOUNTER — Other Ambulatory Visit (HOSPITAL_COMMUNITY): Payer: Self-pay

## 2021-08-12 DIAGNOSIS — G894 Chronic pain syndrome: Secondary | ICD-10-CM | POA: Diagnosis not present

## 2021-09-09 ENCOUNTER — Other Ambulatory Visit: Payer: Self-pay

## 2021-09-09 ENCOUNTER — Other Ambulatory Visit (HOSPITAL_COMMUNITY): Payer: Self-pay

## 2021-09-09 MED FILL — Metoprolol Succinate Tab ER 24HR 50 MG (Tartrate Equiv): ORAL | 90 days supply | Qty: 90 | Fill #0 | Status: AC

## 2021-09-18 ENCOUNTER — Other Ambulatory Visit (HOSPITAL_COMMUNITY): Payer: Self-pay

## 2021-09-18 MED ORDER — ALPRAZOLAM 1 MG PO TABS
ORAL_TABLET | ORAL | 0 refills | Status: AC
  Filled 2021-09-18: qty 60, 30d supply, fill #0
  Filled 2021-10-22: qty 15, 7d supply, fill #1

## 2021-10-02 ENCOUNTER — Other Ambulatory Visit (HOSPITAL_COMMUNITY): Payer: Self-pay

## 2021-10-02 MED FILL — Lisinopril Tab 20 MG: ORAL | 90 days supply | Qty: 90 | Fill #2 | Status: AC

## 2021-10-12 ENCOUNTER — Other Ambulatory Visit (HOSPITAL_COMMUNITY): Payer: Self-pay

## 2021-10-22 ENCOUNTER — Other Ambulatory Visit (HOSPITAL_COMMUNITY): Payer: Self-pay

## 2021-11-09 DIAGNOSIS — Z1231 Encounter for screening mammogram for malignant neoplasm of breast: Secondary | ICD-10-CM | POA: Diagnosis not present

## 2021-11-09 DIAGNOSIS — Z6833 Body mass index (BMI) 33.0-33.9, adult: Secondary | ICD-10-CM | POA: Diagnosis not present

## 2021-11-09 DIAGNOSIS — Z01419 Encounter for gynecological examination (general) (routine) without abnormal findings: Secondary | ICD-10-CM | POA: Diagnosis not present

## 2021-11-13 DIAGNOSIS — Z79899 Other long term (current) drug therapy: Secondary | ICD-10-CM | POA: Diagnosis not present

## 2021-11-13 DIAGNOSIS — Z5181 Encounter for therapeutic drug level monitoring: Secondary | ICD-10-CM | POA: Diagnosis not present

## 2021-11-13 DIAGNOSIS — G894 Chronic pain syndrome: Secondary | ICD-10-CM | POA: Diagnosis not present

## 2021-11-17 DIAGNOSIS — F411 Generalized anxiety disorder: Secondary | ICD-10-CM | POA: Diagnosis not present

## 2021-11-17 DIAGNOSIS — E782 Mixed hyperlipidemia: Secondary | ICD-10-CM | POA: Diagnosis not present

## 2021-11-17 DIAGNOSIS — I10 Essential (primary) hypertension: Secondary | ICD-10-CM | POA: Diagnosis not present

## 2021-11-25 ENCOUNTER — Other Ambulatory Visit (HOSPITAL_COMMUNITY): Payer: Self-pay

## 2021-11-25 DIAGNOSIS — F17211 Nicotine dependence, cigarettes, in remission: Secondary | ICD-10-CM | POA: Diagnosis not present

## 2021-11-25 DIAGNOSIS — I10 Essential (primary) hypertension: Secondary | ICD-10-CM | POA: Diagnosis not present

## 2021-11-25 DIAGNOSIS — E782 Mixed hyperlipidemia: Secondary | ICD-10-CM | POA: Diagnosis not present

## 2021-11-25 DIAGNOSIS — F411 Generalized anxiety disorder: Secondary | ICD-10-CM | POA: Diagnosis not present

## 2021-11-25 DIAGNOSIS — G894 Chronic pain syndrome: Secondary | ICD-10-CM | POA: Diagnosis not present

## 2021-11-25 MED ORDER — LISINOPRIL 20 MG PO TABS
20.0000 mg | ORAL_TABLET | Freq: Every day | ORAL | 3 refills | Status: DC
  Filled 2021-11-25 – 2022-01-11 (×2): qty 90, 90d supply, fill #0
  Filled 2022-04-12: qty 90, 90d supply, fill #1
  Filled 2022-07-13: qty 90, 90d supply, fill #2
  Filled 2022-10-11: qty 90, 90d supply, fill #3

## 2021-11-25 MED ORDER — METOPROLOL SUCCINATE ER 50 MG PO TB24
50.0000 mg | ORAL_TABLET | Freq: Every day | ORAL | 3 refills | Status: DC
  Filled 2021-11-25: qty 90, 90d supply, fill #0
  Filled 2022-03-08: qty 90, 90d supply, fill #1
  Filled 2022-06-17: qty 90, 90d supply, fill #2
  Filled 2022-09-17: qty 90, 90d supply, fill #3

## 2021-12-24 ENCOUNTER — Other Ambulatory Visit (HOSPITAL_COMMUNITY): Payer: Self-pay

## 2021-12-25 ENCOUNTER — Other Ambulatory Visit (HOSPITAL_COMMUNITY): Payer: Self-pay

## 2021-12-25 MED ORDER — ALPRAZOLAM 1 MG PO TABS
ORAL_TABLET | ORAL | 0 refills | Status: DC
  Filled 2021-12-25: qty 45, 30d supply, fill #0

## 2021-12-31 ENCOUNTER — Other Ambulatory Visit (HOSPITAL_COMMUNITY): Payer: Self-pay

## 2022-01-11 ENCOUNTER — Other Ambulatory Visit (HOSPITAL_COMMUNITY): Payer: Self-pay

## 2022-01-26 DIAGNOSIS — Z122 Encounter for screening for malignant neoplasm of respiratory organs: Secondary | ICD-10-CM | POA: Diagnosis not present

## 2022-01-26 DIAGNOSIS — F17210 Nicotine dependence, cigarettes, uncomplicated: Secondary | ICD-10-CM | POA: Diagnosis not present

## 2022-02-01 DIAGNOSIS — F4010 Social phobia, unspecified: Secondary | ICD-10-CM | POA: Diagnosis not present

## 2022-02-02 ENCOUNTER — Other Ambulatory Visit (HOSPITAL_COMMUNITY): Payer: Self-pay

## 2022-02-02 MED ORDER — ALPRAZOLAM 1 MG PO TABS
ORAL_TABLET | ORAL | 0 refills | Status: AC
  Filled 2022-02-02: qty 135, 90d supply, fill #0

## 2022-02-10 DIAGNOSIS — G894 Chronic pain syndrome: Secondary | ICD-10-CM | POA: Diagnosis not present

## 2022-02-10 DIAGNOSIS — Z79899 Other long term (current) drug therapy: Secondary | ICD-10-CM | POA: Diagnosis not present

## 2022-03-08 ENCOUNTER — Other Ambulatory Visit (HOSPITAL_COMMUNITY): Payer: Self-pay

## 2022-04-12 ENCOUNTER — Other Ambulatory Visit (HOSPITAL_COMMUNITY): Payer: Self-pay

## 2022-04-21 DIAGNOSIS — H524 Presbyopia: Secondary | ICD-10-CM | POA: Diagnosis not present

## 2022-04-21 DIAGNOSIS — H2513 Age-related nuclear cataract, bilateral: Secondary | ICD-10-CM | POA: Diagnosis not present

## 2022-04-21 DIAGNOSIS — H43813 Vitreous degeneration, bilateral: Secondary | ICD-10-CM | POA: Diagnosis not present

## 2022-04-30 DIAGNOSIS — Z1231 Encounter for screening mammogram for malignant neoplasm of breast: Secondary | ICD-10-CM | POA: Diagnosis not present

## 2022-05-10 DIAGNOSIS — G894 Chronic pain syndrome: Secondary | ICD-10-CM | POA: Diagnosis not present

## 2022-05-10 DIAGNOSIS — Z79899 Other long term (current) drug therapy: Secondary | ICD-10-CM | POA: Diagnosis not present

## 2022-05-12 ENCOUNTER — Other Ambulatory Visit (HOSPITAL_COMMUNITY): Payer: Self-pay

## 2022-05-12 MED ORDER — ALPRAZOLAM 1 MG PO TABS
ORAL_TABLET | ORAL | 0 refills | Status: AC
Start: 2022-05-12 — End: ?
  Filled 2022-05-12: qty 135, 90d supply, fill #0

## 2022-06-02 DIAGNOSIS — L821 Other seborrheic keratosis: Secondary | ICD-10-CM | POA: Diagnosis not present

## 2022-06-02 DIAGNOSIS — D2362 Other benign neoplasm of skin of left upper limb, including shoulder: Secondary | ICD-10-CM | POA: Diagnosis not present

## 2022-06-02 DIAGNOSIS — D2372 Other benign neoplasm of skin of left lower limb, including hip: Secondary | ICD-10-CM | POA: Diagnosis not present

## 2022-06-02 DIAGNOSIS — L812 Freckles: Secondary | ICD-10-CM | POA: Diagnosis not present

## 2022-06-02 DIAGNOSIS — L814 Other melanin hyperpigmentation: Secondary | ICD-10-CM | POA: Diagnosis not present

## 2022-06-08 ENCOUNTER — Other Ambulatory Visit (HOSPITAL_COMMUNITY): Payer: Self-pay

## 2022-06-17 ENCOUNTER — Other Ambulatory Visit (HOSPITAL_COMMUNITY): Payer: Self-pay

## 2022-06-18 ENCOUNTER — Other Ambulatory Visit (HOSPITAL_COMMUNITY): Payer: Self-pay

## 2022-07-13 ENCOUNTER — Other Ambulatory Visit (HOSPITAL_COMMUNITY): Payer: Self-pay

## 2022-07-26 DIAGNOSIS — F4010 Social phobia, unspecified: Secondary | ICD-10-CM | POA: Diagnosis not present

## 2022-08-02 ENCOUNTER — Other Ambulatory Visit (HOSPITAL_COMMUNITY): Payer: Self-pay

## 2022-08-02 MED ORDER — ALPRAZOLAM 1 MG PO TABS
1.0000 mg | ORAL_TABLET | Freq: Every day | ORAL | 0 refills | Status: AC | PRN
  Filled 2022-08-10: qty 135, 90d supply, fill #0

## 2022-08-09 DIAGNOSIS — Z79899 Other long term (current) drug therapy: Secondary | ICD-10-CM | POA: Diagnosis not present

## 2022-08-09 DIAGNOSIS — G894 Chronic pain syndrome: Secondary | ICD-10-CM | POA: Diagnosis not present

## 2022-08-10 ENCOUNTER — Other Ambulatory Visit (HOSPITAL_COMMUNITY): Payer: Self-pay

## 2022-09-17 ENCOUNTER — Other Ambulatory Visit: Payer: Self-pay

## 2022-10-11 ENCOUNTER — Other Ambulatory Visit (HOSPITAL_COMMUNITY): Payer: Self-pay

## 2022-11-03 DIAGNOSIS — G894 Chronic pain syndrome: Secondary | ICD-10-CM | POA: Diagnosis not present

## 2022-11-03 DIAGNOSIS — Z5181 Encounter for therapeutic drug level monitoring: Secondary | ICD-10-CM | POA: Diagnosis not present

## 2022-11-03 DIAGNOSIS — Z79899 Other long term (current) drug therapy: Secondary | ICD-10-CM | POA: Diagnosis not present

## 2022-11-08 ENCOUNTER — Other Ambulatory Visit (HOSPITAL_COMMUNITY): Payer: Self-pay

## 2022-11-09 ENCOUNTER — Other Ambulatory Visit (HOSPITAL_COMMUNITY): Payer: Self-pay

## 2022-11-09 ENCOUNTER — Other Ambulatory Visit: Payer: Self-pay

## 2022-11-09 MED ORDER — ALPRAZOLAM 1 MG PO TABS
1.0000 mg | ORAL_TABLET | Freq: Every day | ORAL | 0 refills | Status: DC | PRN
  Filled 2022-11-09: qty 135, 90d supply, fill #0

## 2022-11-25 DIAGNOSIS — E782 Mixed hyperlipidemia: Secondary | ICD-10-CM | POA: Diagnosis not present

## 2022-11-25 DIAGNOSIS — F411 Generalized anxiety disorder: Secondary | ICD-10-CM | POA: Diagnosis not present

## 2022-11-25 DIAGNOSIS — I10 Essential (primary) hypertension: Secondary | ICD-10-CM | POA: Diagnosis not present

## 2022-12-02 ENCOUNTER — Other Ambulatory Visit (HOSPITAL_COMMUNITY): Payer: Self-pay

## 2022-12-02 ENCOUNTER — Other Ambulatory Visit: Payer: Self-pay

## 2022-12-02 DIAGNOSIS — Z7182 Exercise counseling: Secondary | ICD-10-CM | POA: Diagnosis not present

## 2022-12-02 DIAGNOSIS — F411 Generalized anxiety disorder: Secondary | ICD-10-CM | POA: Diagnosis not present

## 2022-12-02 DIAGNOSIS — Z713 Dietary counseling and surveillance: Secondary | ICD-10-CM | POA: Diagnosis not present

## 2022-12-02 DIAGNOSIS — F17211 Nicotine dependence, cigarettes, in remission: Secondary | ICD-10-CM | POA: Diagnosis not present

## 2022-12-02 DIAGNOSIS — I10 Essential (primary) hypertension: Secondary | ICD-10-CM | POA: Diagnosis not present

## 2022-12-02 DIAGNOSIS — Z6831 Body mass index (BMI) 31.0-31.9, adult: Secondary | ICD-10-CM | POA: Diagnosis not present

## 2022-12-02 DIAGNOSIS — Z1331 Encounter for screening for depression: Secondary | ICD-10-CM | POA: Diagnosis not present

## 2022-12-02 DIAGNOSIS — Z1231 Encounter for screening mammogram for malignant neoplasm of breast: Secondary | ICD-10-CM | POA: Diagnosis not present

## 2022-12-02 DIAGNOSIS — E782 Mixed hyperlipidemia: Secondary | ICD-10-CM | POA: Diagnosis not present

## 2022-12-02 DIAGNOSIS — G894 Chronic pain syndrome: Secondary | ICD-10-CM | POA: Diagnosis not present

## 2022-12-02 MED ORDER — METOPROLOL SUCCINATE ER 50 MG PO TB24
50.0000 mg | ORAL_TABLET | Freq: Every day | ORAL | 3 refills | Status: DC
  Filled 2022-12-02: qty 90, 90d supply, fill #0
  Filled 2023-04-11: qty 90, 90d supply, fill #1
  Filled 2023-07-12: qty 90, 90d supply, fill #2
  Filled 2023-10-09: qty 90, 90d supply, fill #3

## 2022-12-02 MED ORDER — LISINOPRIL 20 MG PO TABS
20.0000 mg | ORAL_TABLET | Freq: Every day | ORAL | 3 refills | Status: DC
  Filled 2022-12-02 – 2023-01-10 (×2): qty 90, 90d supply, fill #0
  Filled 2023-04-11: qty 90, 90d supply, fill #1
  Filled 2023-07-12: qty 90, 90d supply, fill #2
  Filled 2023-10-09: qty 90, 90d supply, fill #3

## 2023-01-03 DIAGNOSIS — Z01419 Encounter for gynecological examination (general) (routine) without abnormal findings: Secondary | ICD-10-CM | POA: Diagnosis not present

## 2023-01-10 ENCOUNTER — Other Ambulatory Visit (HOSPITAL_COMMUNITY): Payer: Self-pay

## 2023-01-17 DIAGNOSIS — F4010 Social phobia, unspecified: Secondary | ICD-10-CM | POA: Diagnosis not present

## 2023-01-25 DIAGNOSIS — Z79899 Other long term (current) drug therapy: Secondary | ICD-10-CM | POA: Diagnosis not present

## 2023-01-25 DIAGNOSIS — G894 Chronic pain syndrome: Secondary | ICD-10-CM | POA: Diagnosis not present

## 2023-02-08 ENCOUNTER — Other Ambulatory Visit (HOSPITAL_COMMUNITY): Payer: Self-pay

## 2023-02-09 ENCOUNTER — Other Ambulatory Visit (HOSPITAL_COMMUNITY): Payer: Self-pay

## 2023-02-09 ENCOUNTER — Other Ambulatory Visit: Payer: Self-pay

## 2023-02-09 MED ORDER — ALPRAZOLAM 1 MG PO TABS
1.0000 mg | ORAL_TABLET | Freq: Every day | ORAL | 0 refills | Status: AC | PRN
  Filled 2023-02-09: qty 135, 90d supply, fill #0

## 2023-02-12 DIAGNOSIS — Z122 Encounter for screening for malignant neoplasm of respiratory organs: Secondary | ICD-10-CM | POA: Diagnosis not present

## 2023-02-12 DIAGNOSIS — R911 Solitary pulmonary nodule: Secondary | ICD-10-CM | POA: Diagnosis not present

## 2023-02-12 DIAGNOSIS — F17210 Nicotine dependence, cigarettes, uncomplicated: Secondary | ICD-10-CM | POA: Diagnosis not present

## 2023-02-12 DIAGNOSIS — Z87891 Personal history of nicotine dependence: Secondary | ICD-10-CM | POA: Diagnosis not present

## 2023-04-12 ENCOUNTER — Other Ambulatory Visit (HOSPITAL_COMMUNITY): Payer: Self-pay

## 2023-04-26 DIAGNOSIS — G894 Chronic pain syndrome: Secondary | ICD-10-CM | POA: Diagnosis not present

## 2023-04-26 DIAGNOSIS — Z79899 Other long term (current) drug therapy: Secondary | ICD-10-CM | POA: Diagnosis not present

## 2023-05-07 DIAGNOSIS — Z1231 Encounter for screening mammogram for malignant neoplasm of breast: Secondary | ICD-10-CM | POA: Diagnosis not present

## 2023-05-20 DIAGNOSIS — N6325 Unspecified lump in the left breast, overlapping quadrants: Secondary | ICD-10-CM | POA: Diagnosis not present

## 2023-05-20 DIAGNOSIS — N6323 Unspecified lump in the left breast, lower outer quadrant: Secondary | ICD-10-CM | POA: Diagnosis not present

## 2023-05-20 DIAGNOSIS — R928 Other abnormal and inconclusive findings on diagnostic imaging of breast: Secondary | ICD-10-CM | POA: Diagnosis not present

## 2023-06-07 DIAGNOSIS — D2372 Other benign neoplasm of skin of left lower limb, including hip: Secondary | ICD-10-CM | POA: Diagnosis not present

## 2023-06-07 DIAGNOSIS — L814 Other melanin hyperpigmentation: Secondary | ICD-10-CM | POA: Diagnosis not present

## 2023-06-07 DIAGNOSIS — L821 Other seborrheic keratosis: Secondary | ICD-10-CM | POA: Diagnosis not present

## 2023-06-07 DIAGNOSIS — L820 Inflamed seborrheic keratosis: Secondary | ICD-10-CM | POA: Diagnosis not present

## 2023-07-12 ENCOUNTER — Other Ambulatory Visit: Payer: Self-pay

## 2023-07-15 DIAGNOSIS — F4010 Social phobia, unspecified: Secondary | ICD-10-CM | POA: Diagnosis not present

## 2023-07-18 ENCOUNTER — Other Ambulatory Visit: Payer: Self-pay

## 2023-07-18 ENCOUNTER — Other Ambulatory Visit (HOSPITAL_COMMUNITY): Payer: Self-pay

## 2023-07-18 MED ORDER — ALPRAZOLAM 1 MG PO TABS
1.0000 mg | ORAL_TABLET | Freq: Every day | ORAL | 0 refills | Status: AC | PRN
  Filled 2023-07-18: qty 135, 90d supply, fill #0

## 2023-07-26 DIAGNOSIS — G894 Chronic pain syndrome: Secondary | ICD-10-CM | POA: Diagnosis not present

## 2023-07-26 DIAGNOSIS — Z79899 Other long term (current) drug therapy: Secondary | ICD-10-CM | POA: Diagnosis not present

## 2023-08-03 DIAGNOSIS — M545 Low back pain, unspecified: Secondary | ICD-10-CM | POA: Diagnosis not present

## 2023-08-03 DIAGNOSIS — G8929 Other chronic pain: Secondary | ICD-10-CM | POA: Diagnosis not present

## 2023-08-03 DIAGNOSIS — M1711 Unilateral primary osteoarthritis, right knee: Secondary | ICD-10-CM | POA: Diagnosis not present

## 2023-08-03 DIAGNOSIS — Z6832 Body mass index (BMI) 32.0-32.9, adult: Secondary | ICD-10-CM | POA: Diagnosis not present

## 2023-08-04 ENCOUNTER — Other Ambulatory Visit (HOSPITAL_COMMUNITY): Payer: Self-pay

## 2023-08-04 MED ORDER — TOPIRAMATE 25 MG PO TABS
12.5000 mg | ORAL_TABLET | Freq: Two times a day (BID) | ORAL | 0 refills | Status: AC
Start: 2023-08-03 — End: ?
  Filled 2023-08-04 – 2023-11-02 (×2): qty 30, 30d supply, fill #0

## 2023-09-05 ENCOUNTER — Other Ambulatory Visit (HOSPITAL_COMMUNITY): Payer: Self-pay

## 2023-09-05 DIAGNOSIS — M545 Low back pain, unspecified: Secondary | ICD-10-CM | POA: Diagnosis not present

## 2023-09-05 DIAGNOSIS — Z7689 Persons encountering health services in other specified circumstances: Secondary | ICD-10-CM | POA: Diagnosis not present

## 2023-09-05 DIAGNOSIS — E6609 Other obesity due to excess calories: Secondary | ICD-10-CM | POA: Diagnosis not present

## 2023-09-05 DIAGNOSIS — Z6830 Body mass index (BMI) 30.0-30.9, adult: Secondary | ICD-10-CM | POA: Diagnosis not present

## 2023-09-05 DIAGNOSIS — M1711 Unilateral primary osteoarthritis, right knee: Secondary | ICD-10-CM | POA: Diagnosis not present

## 2023-09-05 DIAGNOSIS — I10 Essential (primary) hypertension: Secondary | ICD-10-CM | POA: Diagnosis not present

## 2023-09-05 DIAGNOSIS — E66811 Obesity, class 1: Secondary | ICD-10-CM | POA: Diagnosis not present

## 2023-09-05 MED ORDER — TOPIRAMATE 25 MG PO TABS
12.5000 mg | ORAL_TABLET | Freq: Two times a day (BID) | ORAL | 0 refills | Status: AC
  Filled 2023-09-05 – 2023-09-28 (×2): qty 30, 30d supply, fill #0

## 2023-09-05 MED ORDER — PHENTERMINE HCL 37.5 MG PO TABS
37.5000 mg | ORAL_TABLET | Freq: Every day | ORAL | 2 refills | Status: AC
  Filled 2023-09-05 – 2023-09-28 (×2): qty 30, 30d supply, fill #0
  Filled 2024-02-15: qty 30, 30d supply, fill #1

## 2023-09-16 ENCOUNTER — Encounter (HOSPITAL_COMMUNITY): Payer: Self-pay

## 2023-09-16 ENCOUNTER — Other Ambulatory Visit: Payer: Self-pay

## 2023-09-16 ENCOUNTER — Other Ambulatory Visit (HOSPITAL_COMMUNITY): Payer: Self-pay

## 2023-09-16 ENCOUNTER — Other Ambulatory Visit (HOSPITAL_BASED_OUTPATIENT_CLINIC_OR_DEPARTMENT_OTHER): Payer: Self-pay

## 2023-09-19 ENCOUNTER — Other Ambulatory Visit: Payer: Self-pay

## 2023-09-20 ENCOUNTER — Other Ambulatory Visit: Payer: Self-pay

## 2023-09-21 ENCOUNTER — Other Ambulatory Visit: Payer: Self-pay

## 2023-09-27 ENCOUNTER — Other Ambulatory Visit (HOSPITAL_COMMUNITY): Payer: Self-pay

## 2023-09-27 ENCOUNTER — Other Ambulatory Visit: Payer: Self-pay

## 2023-09-28 ENCOUNTER — Other Ambulatory Visit (HOSPITAL_COMMUNITY): Payer: Self-pay

## 2023-09-28 ENCOUNTER — Other Ambulatory Visit: Payer: Self-pay

## 2023-10-09 ENCOUNTER — Other Ambulatory Visit (HOSPITAL_COMMUNITY): Payer: Self-pay

## 2023-10-25 DIAGNOSIS — Z5181 Encounter for therapeutic drug level monitoring: Secondary | ICD-10-CM | POA: Diagnosis not present

## 2023-10-25 DIAGNOSIS — G894 Chronic pain syndrome: Secondary | ICD-10-CM | POA: Diagnosis not present

## 2023-10-25 DIAGNOSIS — Z79899 Other long term (current) drug therapy: Secondary | ICD-10-CM | POA: Diagnosis not present

## 2023-10-26 DIAGNOSIS — I10 Essential (primary) hypertension: Secondary | ICD-10-CM | POA: Diagnosis not present

## 2023-10-26 DIAGNOSIS — Z7689 Persons encountering health services in other specified circumstances: Secondary | ICD-10-CM | POA: Diagnosis not present

## 2023-10-26 DIAGNOSIS — Z6829 Body mass index (BMI) 29.0-29.9, adult: Secondary | ICD-10-CM | POA: Diagnosis not present

## 2023-10-26 DIAGNOSIS — Z7182 Exercise counseling: Secondary | ICD-10-CM | POA: Diagnosis not present

## 2023-10-26 DIAGNOSIS — Z5181 Encounter for therapeutic drug level monitoring: Secondary | ICD-10-CM | POA: Diagnosis not present

## 2023-10-26 DIAGNOSIS — Z1331 Encounter for screening for depression: Secondary | ICD-10-CM | POA: Diagnosis not present

## 2023-10-26 DIAGNOSIS — E6609 Other obesity due to excess calories: Secondary | ICD-10-CM | POA: Diagnosis not present

## 2023-10-28 ENCOUNTER — Other Ambulatory Visit (HOSPITAL_COMMUNITY): Payer: Self-pay

## 2023-11-02 ENCOUNTER — Other Ambulatory Visit: Payer: Self-pay

## 2023-11-02 ENCOUNTER — Other Ambulatory Visit (HOSPITAL_COMMUNITY): Payer: Self-pay

## 2023-11-18 DIAGNOSIS — R928 Other abnormal and inconclusive findings on diagnostic imaging of breast: Secondary | ICD-10-CM | POA: Diagnosis not present

## 2023-11-23 ENCOUNTER — Other Ambulatory Visit (HOSPITAL_COMMUNITY): Payer: Self-pay

## 2023-11-24 DIAGNOSIS — E782 Mixed hyperlipidemia: Secondary | ICD-10-CM | POA: Diagnosis not present

## 2023-12-01 ENCOUNTER — Other Ambulatory Visit (HOSPITAL_COMMUNITY): Payer: Self-pay

## 2023-12-01 DIAGNOSIS — Z122 Encounter for screening for malignant neoplasm of respiratory organs: Secondary | ICD-10-CM | POA: Diagnosis not present

## 2023-12-01 DIAGNOSIS — F17211 Nicotine dependence, cigarettes, in remission: Secondary | ICD-10-CM | POA: Diagnosis not present

## 2023-12-01 DIAGNOSIS — E6609 Other obesity due to excess calories: Secondary | ICD-10-CM | POA: Diagnosis not present

## 2023-12-01 DIAGNOSIS — F411 Generalized anxiety disorder: Secondary | ICD-10-CM | POA: Diagnosis not present

## 2023-12-01 DIAGNOSIS — E559 Vitamin D deficiency, unspecified: Secondary | ICD-10-CM | POA: Diagnosis not present

## 2023-12-01 DIAGNOSIS — E782 Mixed hyperlipidemia: Secondary | ICD-10-CM | POA: Diagnosis not present

## 2023-12-01 DIAGNOSIS — Z7182 Exercise counseling: Secondary | ICD-10-CM | POA: Diagnosis not present

## 2023-12-01 DIAGNOSIS — Z1231 Encounter for screening mammogram for malignant neoplasm of breast: Secondary | ICD-10-CM | POA: Diagnosis not present

## 2023-12-01 DIAGNOSIS — I10 Essential (primary) hypertension: Secondary | ICD-10-CM | POA: Diagnosis not present

## 2023-12-01 DIAGNOSIS — G894 Chronic pain syndrome: Secondary | ICD-10-CM | POA: Diagnosis not present

## 2023-12-01 MED ORDER — LISINOPRIL 20 MG PO TABS
20.0000 mg | ORAL_TABLET | Freq: Every day | ORAL | 3 refills | Status: AC
  Filled 2024-01-10 (×2): qty 90, 90d supply, fill #0
  Filled 2024-04-14: qty 90, 90d supply, fill #1
  Filled 2024-07-16: qty 90, 90d supply, fill #2

## 2023-12-01 MED ORDER — METOPROLOL SUCCINATE ER 50 MG PO TB24
50.0000 mg | ORAL_TABLET | Freq: Every day | ORAL | 3 refills | Status: AC
  Filled 2024-01-10 (×2): qty 90, 90d supply, fill #0
  Filled 2024-04-14: qty 90, 90d supply, fill #1
  Filled 2024-07-16: qty 90, 90d supply, fill #2
  Filled 2024-10-16: qty 90, 90d supply, fill #3

## 2024-01-10 ENCOUNTER — Other Ambulatory Visit: Payer: Self-pay

## 2024-01-10 ENCOUNTER — Other Ambulatory Visit (HOSPITAL_COMMUNITY): Payer: Self-pay

## 2024-01-10 ENCOUNTER — Encounter (HOSPITAL_COMMUNITY): Payer: Self-pay

## 2024-01-10 DIAGNOSIS — F4010 Social phobia, unspecified: Secondary | ICD-10-CM | POA: Diagnosis not present

## 2024-01-10 MED ORDER — ALPRAZOLAM 1 MG PO TABS
1.0000 mg | ORAL_TABLET | Freq: Every day | ORAL | 0 refills | Status: DC | PRN
  Filled 2024-01-10 (×2): qty 135, 67d supply, fill #0

## 2024-01-17 DIAGNOSIS — G894 Chronic pain syndrome: Secondary | ICD-10-CM | POA: Diagnosis not present

## 2024-01-17 DIAGNOSIS — Z79899 Other long term (current) drug therapy: Secondary | ICD-10-CM | POA: Diagnosis not present

## 2024-02-08 DIAGNOSIS — K5730 Diverticulosis of large intestine without perforation or abscess without bleeding: Secondary | ICD-10-CM | POA: Diagnosis not present

## 2024-02-08 DIAGNOSIS — Z1211 Encounter for screening for malignant neoplasm of colon: Secondary | ICD-10-CM | POA: Diagnosis not present

## 2024-02-15 ENCOUNTER — Other Ambulatory Visit: Payer: Self-pay

## 2024-02-15 ENCOUNTER — Other Ambulatory Visit (HOSPITAL_COMMUNITY): Payer: Self-pay

## 2024-04-14 ENCOUNTER — Other Ambulatory Visit (HOSPITAL_COMMUNITY): Payer: Self-pay

## 2024-04-16 ENCOUNTER — Other Ambulatory Visit (HOSPITAL_COMMUNITY): Payer: Self-pay

## 2024-04-17 ENCOUNTER — Other Ambulatory Visit: Payer: Self-pay

## 2024-04-17 DIAGNOSIS — Z79899 Other long term (current) drug therapy: Secondary | ICD-10-CM | POA: Diagnosis not present

## 2024-04-17 DIAGNOSIS — G894 Chronic pain syndrome: Secondary | ICD-10-CM | POA: Diagnosis not present

## 2024-05-03 ENCOUNTER — Other Ambulatory Visit (HOSPITAL_COMMUNITY): Payer: Self-pay

## 2024-05-09 ENCOUNTER — Other Ambulatory Visit (HOSPITAL_COMMUNITY): Payer: Self-pay

## 2024-05-18 ENCOUNTER — Other Ambulatory Visit (HOSPITAL_COMMUNITY): Payer: Self-pay

## 2024-05-22 ENCOUNTER — Other Ambulatory Visit (HOSPITAL_COMMUNITY): Payer: Self-pay

## 2024-05-22 MED ORDER — ALPRAZOLAM 1 MG PO TABS
1.0000 mg | ORAL_TABLET | Freq: Every day | ORAL | 0 refills | Status: DC | PRN
  Filled 2024-05-22: qty 135, 67d supply, fill #0

## 2024-05-23 ENCOUNTER — Other Ambulatory Visit (HOSPITAL_COMMUNITY): Payer: Self-pay

## 2024-05-24 ENCOUNTER — Other Ambulatory Visit (HOSPITAL_COMMUNITY): Payer: Self-pay

## 2024-05-25 DIAGNOSIS — N6323 Unspecified lump in the left breast, lower outer quadrant: Secondary | ICD-10-CM | POA: Diagnosis not present

## 2024-05-25 DIAGNOSIS — N6325 Unspecified lump in the left breast, overlapping quadrants: Secondary | ICD-10-CM | POA: Diagnosis not present

## 2024-05-25 DIAGNOSIS — R928 Other abnormal and inconclusive findings on diagnostic imaging of breast: Secondary | ICD-10-CM | POA: Diagnosis not present

## 2024-06-11 DIAGNOSIS — L814 Other melanin hyperpigmentation: Secondary | ICD-10-CM | POA: Diagnosis not present

## 2024-06-11 DIAGNOSIS — L821 Other seborrheic keratosis: Secondary | ICD-10-CM | POA: Diagnosis not present

## 2024-06-11 DIAGNOSIS — D225 Melanocytic nevi of trunk: Secondary | ICD-10-CM | POA: Diagnosis not present

## 2024-07-04 DIAGNOSIS — F4010 Social phobia, unspecified: Secondary | ICD-10-CM | POA: Diagnosis not present

## 2024-07-16 ENCOUNTER — Other Ambulatory Visit (HOSPITAL_COMMUNITY): Payer: Self-pay

## 2024-07-20 DIAGNOSIS — G894 Chronic pain syndrome: Secondary | ICD-10-CM | POA: Diagnosis not present

## 2024-07-20 DIAGNOSIS — Z79899 Other long term (current) drug therapy: Secondary | ICD-10-CM | POA: Diagnosis not present

## 2024-08-24 ENCOUNTER — Telehealth: Payer: Self-pay

## 2024-08-24 NOTE — Telephone Encounter (Signed)
 RN contacted patient regarding being overdue for next colonoscopy. [place] RN stated that she will make note of this in the patient's chart.

## 2024-09-20 ENCOUNTER — Other Ambulatory Visit (HOSPITAL_COMMUNITY): Payer: Self-pay

## 2024-09-21 ENCOUNTER — Other Ambulatory Visit: Payer: Self-pay

## 2024-09-21 ENCOUNTER — Other Ambulatory Visit (HOSPITAL_COMMUNITY): Payer: Self-pay

## 2024-09-21 MED ORDER — ALPRAZOLAM 1 MG PO TABS
1.0000 mg | ORAL_TABLET | Freq: Every day | ORAL | 0 refills | Status: AC | PRN
  Filled 2024-09-21: qty 135, 67d supply, fill #0

## 2024-10-16 ENCOUNTER — Other Ambulatory Visit (HOSPITAL_COMMUNITY): Payer: Self-pay
